# Patient Record
Sex: Female | Born: 1940 | Race: White | Hispanic: No | Smoking: Never smoker
Health system: Southern US, Community
[De-identification: ages and names within clinical notes are randomized; demographics above are authoritative.]

## PROBLEM LIST (undated history)

## (undated) DIAGNOSIS — J302 Other seasonal allergic rhinitis: Secondary | ICD-10-CM

## (undated) DIAGNOSIS — M1990 Unspecified osteoarthritis, unspecified site: Secondary | ICD-10-CM

## (undated) DIAGNOSIS — R079 Chest pain, unspecified: Secondary | ICD-10-CM

## (undated) DIAGNOSIS — I499 Cardiac arrhythmia, unspecified: Secondary | ICD-10-CM

## (undated) DIAGNOSIS — E119 Type 2 diabetes mellitus without complications: Secondary | ICD-10-CM

## (undated) DIAGNOSIS — Z9889 Other specified postprocedural states: Secondary | ICD-10-CM

## (undated) DIAGNOSIS — E669 Obesity, unspecified: Secondary | ICD-10-CM

## (undated) DIAGNOSIS — R112 Nausea with vomiting, unspecified: Secondary | ICD-10-CM

## (undated) DIAGNOSIS — E7800 Pure hypercholesterolemia, unspecified: Secondary | ICD-10-CM

## (undated) DIAGNOSIS — I10 Essential (primary) hypertension: Secondary | ICD-10-CM

## (undated) DIAGNOSIS — G4730 Sleep apnea, unspecified: Secondary | ICD-10-CM

## (undated) HISTORY — DX: Essential (primary) hypertension: I10

## (undated) HISTORY — PX: HAMMER TOE SURGERY: SHX385

## (undated) HISTORY — DX: Type 2 diabetes mellitus without complications: E11.9

## (undated) HISTORY — DX: Cardiac arrhythmia, unspecified: I49.9

## (undated) HISTORY — DX: Other seasonal allergic rhinitis: J30.2

## (undated) HISTORY — PX: APPENDECTOMY: SHX54

## (undated) HISTORY — PX: EXAMINATION UNDER ANESTHESIA: SHX1540

## (undated) HISTORY — DX: Pure hypercholesterolemia, unspecified: E78.00

## (undated) HISTORY — PX: CARPAL TUNNEL RELEASE: SHX101

## (undated) HISTORY — DX: Sleep apnea, unspecified: G47.30

## (undated) HISTORY — PX: BREAST CYST EXCISION: SHX579

## (undated) HISTORY — PX: CHOLECYSTECTOMY: SHX55

## (undated) HISTORY — DX: Obesity, unspecified: E66.9

---

## 1976-09-16 HISTORY — PX: VAGINAL HYSTERECTOMY: SUR661

## 1998-03-10 ENCOUNTER — Other Ambulatory Visit: Admission: RE | Admit: 1998-03-10 | Discharge: 1998-03-10 | Payer: Self-pay | Admitting: *Deleted

## 1999-01-13 ENCOUNTER — Inpatient Hospital Stay (HOSPITAL_COMMUNITY): Admission: EM | Admit: 1999-01-13 | Discharge: 1999-01-16 | Payer: Self-pay | Admitting: Cardiology

## 1999-01-23 ENCOUNTER — Other Ambulatory Visit: Admission: RE | Admit: 1999-01-23 | Discharge: 1999-01-23 | Payer: Self-pay | Admitting: *Deleted

## 1999-05-25 ENCOUNTER — Encounter: Payer: Self-pay | Admitting: *Deleted

## 1999-05-28 ENCOUNTER — Ambulatory Visit (HOSPITAL_COMMUNITY): Admission: RE | Admit: 1999-05-28 | Discharge: 1999-05-29 | Payer: Self-pay | Admitting: *Deleted

## 1999-05-28 ENCOUNTER — Encounter: Payer: Self-pay | Admitting: *Deleted

## 2000-02-29 ENCOUNTER — Other Ambulatory Visit: Admission: RE | Admit: 2000-02-29 | Discharge: 2000-02-29 | Payer: Self-pay | Admitting: *Deleted

## 2001-03-24 ENCOUNTER — Other Ambulatory Visit: Admission: RE | Admit: 2001-03-24 | Discharge: 2001-03-24 | Payer: Self-pay | Admitting: *Deleted

## 2003-01-07 ENCOUNTER — Ambulatory Visit (HOSPITAL_COMMUNITY): Admission: RE | Admit: 2003-01-07 | Discharge: 2003-01-07 | Payer: Self-pay | Admitting: Cardiology

## 2003-08-25 ENCOUNTER — Other Ambulatory Visit: Admission: RE | Admit: 2003-08-25 | Discharge: 2003-08-25 | Payer: Self-pay | Admitting: *Deleted

## 2004-03-02 ENCOUNTER — Ambulatory Visit (HOSPITAL_COMMUNITY): Admission: RE | Admit: 2004-03-02 | Discharge: 2004-03-02 | Payer: Self-pay | Admitting: Surgery

## 2008-08-19 ENCOUNTER — Encounter: Admission: RE | Admit: 2008-08-19 | Discharge: 2008-08-19 | Payer: Self-pay | Admitting: *Deleted

## 2009-01-18 ENCOUNTER — Emergency Department (HOSPITAL_COMMUNITY): Admission: EM | Admit: 2009-01-18 | Discharge: 2009-01-18 | Payer: Self-pay | Admitting: Emergency Medicine
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
## 2010-01-31 ENCOUNTER — Encounter: Admission: RE | Admit: 2010-01-31 | Discharge: 2010-01-31 | Payer: Self-pay | Admitting: Family Medicine

## 2010-02-10 ENCOUNTER — Encounter: Payer: Self-pay | Admitting: Cardiology

## 2010-04-02 ENCOUNTER — Encounter: Payer: Self-pay | Admitting: Cardiology

## 2010-04-23 DIAGNOSIS — E7800 Pure hypercholesterolemia, unspecified: Secondary | ICD-10-CM | POA: Insufficient documentation

## 2010-04-23 DIAGNOSIS — R5383 Other fatigue: Secondary | ICD-10-CM

## 2010-04-23 DIAGNOSIS — R0602 Shortness of breath: Secondary | ICD-10-CM

## 2010-04-23 DIAGNOSIS — R079 Chest pain, unspecified: Secondary | ICD-10-CM | POA: Insufficient documentation

## 2010-04-23 DIAGNOSIS — R5381 Other malaise: Secondary | ICD-10-CM

## 2010-04-23 DIAGNOSIS — E108 Type 1 diabetes mellitus with unspecified complications: Secondary | ICD-10-CM

## 2010-04-23 DIAGNOSIS — I10 Essential (primary) hypertension: Secondary | ICD-10-CM | POA: Insufficient documentation

## 2010-04-25 ENCOUNTER — Ambulatory Visit: Payer: Self-pay | Admitting: Cardiology

## 2010-04-25 DIAGNOSIS — R002 Palpitations: Secondary | ICD-10-CM

## 2010-05-25 ENCOUNTER — Ambulatory Visit (HOSPITAL_COMMUNITY): Admission: RE | Admit: 2010-05-25 | Discharge: 2010-05-25 | Payer: Self-pay | Admitting: Cardiology

## 2010-05-25 ENCOUNTER — Ambulatory Visit: Payer: Self-pay

## 2010-05-25 ENCOUNTER — Ambulatory Visit: Payer: Self-pay | Admitting: Cardiology

## 2010-05-25 ENCOUNTER — Encounter: Payer: Self-pay | Admitting: Cardiology

## 2010-06-22 ENCOUNTER — Ambulatory Visit: Payer: Self-pay | Admitting: Cardiology

## 2010-06-27 ENCOUNTER — Telehealth (INDEPENDENT_AMBULATORY_CARE_PROVIDER_SITE_OTHER): Payer: Self-pay | Admitting: *Deleted

## 2010-06-29 ENCOUNTER — Telehealth: Payer: Self-pay | Admitting: Cardiology

## 2010-10-16 NOTE — Letter (Signed)
Summary: [facility] Main Problem List/Health Maintenance Record  [facility] Problem List/Health Maintenance Record   Imported By: Roderic Ovens 05/01/2010 11:05:28  _____________________________________________________________________  External Attachment:    Type:   Image     Comment:   External Document

## 2010-10-16 NOTE — Procedures (Signed)
Summary: Summary Report  Summary Report   Imported By: Erle Crocker 07/03/2010 11:43:22  _____________________________________________________________________  External Attachment:    Type:   Image     Comment:   External Document

## 2010-10-16 NOTE — Assessment & Plan Note (Signed)
Summary: per check out/sf   CC:  follow up.  History of Present Illness: 70 year old female I saw in August of 2011 for chest pain and tachycardia. Cardiac catheterization in 2004 showed normal coronary arteries and normal LV function. Patient has had intermittent palpitations for years. Echocardiogram in August of 2011 revealed vigorous LV function and a mid-cavitary gradient as well as mild left atrial enlargement. Cardionet showed sinus rhythm. Stress echocardiogram showed no ischemia on echo images. There was a hypertensive response. Since I saw her previously she does have some dyspnea on exertion relieved with rest. There is no orthopnea, PND, pedal edema, hypertension, syncope. She occasionally feels a tightness in her chest which has been intermittent for years.  Current Medications (verified): 1)  Lantus Solostar 100 Unit/ml Soln (Insulin Glargine) .... 40 Untis Daily 2)  Losartan Potassium-Hctz 100-25 Mg Tabs (Losartan Potassium-Hctz) .[name].. 1 Tab By Mouth Once Daily 3)  Aspirin Ec 325 Mg Tbec (Aspirin) .... Take One Tablet By Mouth Daily 4)  Simcor 1000-20 Mg Xr24h-Tab (Niacin-Simvastatin) .[name].. 1 Tab By Mouth Once Daily 5)  Metformin Hcl 500 Mg Tabs (Metformin Hcl) .... 2 Tabs By Mouth Two Times A Day 6)  Fish Oil   Oil (Fish Oil) .[name].. 1 Tab By Mouth Once Daily 7)  Calcium Carbonate 600 Mg Tabs (Calcium Carbonate) .... 2 Tabs  By Mouth Once Daily 8)  Metoprolol Succinate 25 Mg Xr24h-Tab (Metoprolol Succinate) .... Take One Tablet By Mouth Daily  Past History:  Past Medical History: Reviewed history from 04/25/2010 and no changes required. HYPERTENSION  HYPERCHOLESTEROLEMIA  DM   Past Surgical History: Reviewed history from 04/25/2010 and no changes required. cholecystectomy hysterectomy  Examination under anesthesia and sphincterotomy. carpal tunnel Appendectomy  Social History: Reviewed history from 04/25/2010 and no changes required. Full Time Married  Tobacco Use -  No.  Alcohol Use - no  Review of Systems       no fevers or chills, productive cough, hemoptysis, dysphasia, odynophagia, melena, hematochezia, dysuria, hematuria, rash, seizure activity, orthopnea, PND, pedal edema, claudication. Remaining systems are negative.   Vital Signs:  Patient profile:   70 year old female Height:      67 inches Weight:      217 pounds BMI:     34.11 Pulse rate:   72 / minute Resp:     14 per minute BP sitting:   137 / 73  (left arm)  Vitals Entered By: Kem Parkinson (June 22, 2010 10:16 AM)  Physical Exam  General:  Well-developed well-nourished in no acute distress.  Skin is warm and dry.  HEENT is normal.  Neck is supple. No thyromegaly.  Chest is clear to auscultation with normal expansion.  Cardiovascular exam is regular rate and rhythm.  Abdominal exam nontender or distended. No masses palpated. Extremities show no edema. neuro grossly intact    Impression & Recommendations:  Problem # 1:  PALPITATIONS (ICD-785.1) Patient has had no palpitations while wearing her monitor. Hopefully we will be able to catch her rhythm disturbance in the future. She is having fatigue and I will discontinue her Toprol. Begin Cardizem CD 180 mg p.o. daily. Her updated medication list for this problem includes:    Aspirin Ec 325 Mg Tbec (Aspirin) .[name]... Take one tablet by mouth daily    Cardizem Cd 180 Mg Xr24h-cap (Diltiazem hcl coated beads) ..... One tablet by mouth once daily  Problem # 2:  SHORTNESS OF BREATH (ICD-786.05) I wonder whether there is a component of  diastolic dysfunction with her vigorous LV function. As above discontinue Toprol secondary to fatigue and treatment Cardizem CD 180 mg p.o. daily. Her updated medication list for this problem includes:    Losartan Potassium-hctz 100-25 Mg Tabs (Losartan potassium-hctz) .[name]... 1 tab by mouth once daily    Aspirin Ec 325 Mg Tbec (Aspirin) .[name]... Take one tablet by mouth daily    Metoprolol  Succinate 25 Mg Xr24h-tab (Metoprolol succinate) .[name]... Take one tablet by mouth daily  Problem # 3:  CHEST PAIN (ICD-786.50)  Stress echocardiogram shows no ischemia. Her updated medication list for this problem includes:    Aspirin Ec 325 Mg Tbec (Aspirin) .[name]... Take one tablet by mouth daily    Cardizem Cd 180 Mg Xr24h-cap (Diltiazem hcl coated beads) ..... One tablet by mouth once daily  Her updated medication list for this problem includes:    Aspirin Ec 325 Mg Tbec (Aspirin) .[name]... Take one tablet by mouth daily    Cardizem Cd 180 Mg Xr24h-cap (Diltiazem hcl coated beads) ..... One tablet by mouth once daily  Problem # 4:  HYPERTENSION (ICD-401.9)  Discontinue beta blocker and dig and Cardizem. Her updated medication list for this problem includes:    Losartan Potassium-hctz 100-25 Mg Tabs (Losartan potassium-hctz) .[name]... 1 tab by mouth once daily    Aspirin Ec 325 Mg Tbec (Aspirin) .[name]... Take one tablet by mouth daily    Cardizem Cd 180 Mg Xr24h-cap (Diltiazem hcl coated beads) ..... One tablet by mouth once daily  Her updated medication list for this problem includes:    Losartan Potassium-hctz 100-25 Mg Tabs (Losartan potassium-hctz) .[name]... 1 tab by mouth once daily    Aspirin Ec 325 Mg Tbec (Aspirin) .[name]... Take one tablet by mouth daily    Cardizem Cd 180 Mg Xr24h-cap (Diltiazem hcl coated beads) ..... One tablet by mouth once daily  Problem # 5:  HYPERCHOLESTEROLEMIA (ICD-272.0)  Continue statin. Lipids and liver monitored by primary care. Her updated medication list for this problem includes:    Simcor 1000-20 Mg Xr24h-tab (Niacin-simvastatin) .[name]... 1 tab by mouth once daily  Her updated medication list for this problem includes:    Simcor 1000-20 Mg Xr24h-tab (Niacin-simvastatin) .[name]... 1 tab by mouth once daily  Problem # 6:  DM (ICD-250.00)  Her updated medication list for this problem includes:    Lantus Solostar 100 Unit/ml Soln (Insulin glargine) .Marland KitchenMarland KitchenMarland KitchenMarland Kitchen 40 untis  daily    Losartan Potassium-hctz 100-25 Mg Tabs (Losartan potassium-hctz) .[name]... 1 tab by mouth once daily    Aspirin Ec 325 Mg Tbec (Aspirin) .[name]... Take one tablet by mouth daily    Metformin Hcl 500 Mg Tabs (Metformin hcl) .[name]... 2 tabs by mouth two times a day  Her updated medication list for this problem includes:    Lantus Solostar 100 Unit/ml Soln (Insulin glargine) .Marland KitchenMarland KitchenMarland KitchenMarland Kitchen 40 untis daily    Losartan Potassium-hctz 100-25 Mg Tabs (Losartan potassium-hctz) .[name]... 1 tab by mouth once daily    Aspirin Ec 325 Mg Tbec (Aspirin) .[name]... Take one tablet by mouth daily    Metformin Hcl 500 Mg Tabs (Metformin hcl) .[name]... 2 tabs by mouth two times a day  Patient Instructions: 1)  Your physician recommends that you schedule a follow-up appointment in: 6 MONTHS 2)  Your physician has recommended you make the following change in your medication: STOP METOPROLOL 3)  START CARDIZEM CD 180MG  ONCE DAILY Prescriptions: CARDIZEM CD 180 MG XR24H-CAP (DILTIAZEM HCL COATED BEADS) ONE TABLET by mouth once daily  #90 x  12   Entered by:   Deliah Goody, RN   Authorized by:   Ferman Hamming, MD, [facility]   Signed by:   Deliah Goody, RN on 06/22/2010   Method used:   Faxed to ...       [facility] [employer] education [facility])       [address] [address]       [place], [place]  [address]       Ph: [phone]       Fax: [phone]   RxID:   [id number]

## 2010-10-16 NOTE — Assessment & Plan Note (Signed)
Summary: np6/ dx: tackycardia, chestpain/ pt has bcbs/ gd   CC:  chest pain and sob.Adrienne KitchenMarland KitchenSeverity  of pain 7.  History of Present Illness: 70 year old female was evaluated for chest pain and tachycardia. Cardiac catheterization in 2004 showed normal coronary arteries and normal LV function. Patient has had intermittent palpitations for years. They are described as her heart "racing". They are sudden in onset and resolved after 3-5 minutes. There is associated short of breath and chest tightness as well as mild presyncope but no syncope. She had a more severe episode on July 2. She has also noticed increased dyspnea on exertion but no orthopnea, PND or pedal edema. She also notices occasional chest tightness with activities relieved with rest. There is no associated symptoms. Because of the above we were asked to further evaluate.  Preventive Screening-Counseling & Management  Alcohol-Tobacco     Smoking Status: never  Current Medications (verified): 1)  Lantus Solostar 100 Unit/ml Soln (Insulin Glargine) .... 40 Untis Daily 2)  Losartan Potassium-Hctz 100-25 Mg Tabs (Losartan Potassium-Hctz) .Adrienne Cook.. 1 Tab By Mouth Once Daily 3)  Aspirin Ec 325 Mg Tbec (Aspirin) .... Take One Tablet By Mouth Daily 4)  Simcor 1000-20 Mg Xr24h-Tab (Niacin-Simvastatin) .Adrienne Cook.. 1 Tab By Mouth Once Daily 5)  Metformin Hcl 500 Mg Tabs (Metformin Hcl) .... 2 Tabs By Mouth Two Times A Day 6)  Fish Oil   Oil (Fish Oil) .Adrienne Cook.. 1 Tab By Mouth Once Daily 7)  Calcium Carbonate 600 Mg Tabs (Calcium Carbonate) .... 2 Tabs  By Mouth Once Daily  Past History:  Past Medical History: HYPERTENSION  HYPERCHOLESTEROLEMIA  DM   Past Surgical History: cholecystectomy hysterectomy  Examination under anesthesia and sphincterotomy. carpal tunnel Appendectomy  Family History: Reviewed history and no changes required. Mother with CHF  Social History: Reviewed history and no changes required. Full Time Married  Tobacco Use - No.    Alcohol Use - no Smoking Status:  never  Review of Systems       no fevers or chills, productive cough, hemoptysis, dysphasia, odynophagia, melena, hematochezia, dysuria, hematuria, rash, seizure activity, orthopnea, PND, pedal edema, claudication. Remaining systems are negative.   Vital Signs:  Patient profile:   70 year old female Height:      67 inches Weight:      214 pounds BMI:     33.64 Pulse rate:   100 / minute Resp:     14 per minute BP sitting:   154 / 83  (left arm)  Vitals Entered By: Kem Parkinson (April 25, 2010 10:15 AM)  Physical Exam  General:  Well developed/well nourished in NAD Skin warm/dry Patient not depressed No peripheral clubbing Back-normal HEENT-normal/normal eyelids Neck supple/normal carotid upstroke bilaterally; no bruits; no JVD; no thyromegaly chest - CTA/ normal expansion CV - RRR/normal S1 and S2; no murmurs, rubs or gallops;  PMI nondisplaced; 1/6 systolic murmur left sternal border. Abdomen -NT/ND, no HSM, no mass, + bowel sounds, no bruit 2+ femoral pulses, no bruits Ext-no edema, chords, 2+ DP Neuro-grossly nonfocal     EKG  Procedure date:  04/25/2010  Findings:      Normal sinus rhythm at a rate of 95. Axis normal. RV conduction delay. No ST changes.  Impression & Recommendations:  Problem # 1:  CHEST PAIN (ICD-786.50)  Symptoms somewhat atypical. Scheduled stress echocardiogram. Her updated medication list for this problem includes:    Aspirin Ec 325 Mg Tbec (Aspirin) .Adrienne Cook... Take one tablet by mouth daily  Orders: Stress Echo (  Stress Echo)  Problem # 2:  SHORTNESS OF BREATH (ICD-786.05) Schedule echocardiogram to evaluate LV function and for palpitations as well. Her updated medication list for this problem includes:    Losartan Potassium-hctz 100-25 Mg Tabs (Losartan potassium-hctz) .Adrienne Cook... 1 tab by mouth once daily    Aspirin Ec 325 Mg Tbec (Aspirin) .Adrienne Cook... Take one tablet by mouth daily  Problem # 3:   HYPERTENSION (ICD-401.9) Blood pressure is mildly elevated. We will follow this and most likely add a beta blocker in the future as needed. Her updated medication list for this problem includes:    Losartan Potassium-hctz 100-25 Mg Tabs (Losartan potassium-hctz) .Adrienne Cook... 1 tab by mouth once daily    Aspirin Ec 325 Mg Tbec (Aspirin) .Adrienne Cook... Take one tablet by mouth daily  Problem # 4:  DM (ICD-250.00)  Her updated medication list for this problem includes:    Lantus Solostar 100 Unit/ml Soln (Insulin glargine) .Adrienne KitchenMarland KitchenMarland KitchenMarland Cook 40 untis daily    Losartan Potassium-hctz 100-25 Mg Tabs (Losartan potassium-hctz) .Adrienne Cook... 1 tab by mouth once daily    Aspirin Ec 325 Mg Tbec (Aspirin) .Adrienne Cook... Take one tablet by mouth daily    Metformin Hcl 500 Mg Tabs (Metformin hcl) .Adrienne Cook... 2 tabs by mouth two times a day  Problem # 5:  PALPITATIONS (ICD-785.1)  Etiology unclear but sounds possibly to have an SVT. Schedule cardionet. Will consider beta blocker in the future. Her updated medication list for this problem includes:    Aspirin Ec 325 Mg Tbec (Aspirin) .Adrienne Cook... Take one tablet by mouth daily  Orders: Event (Event) Echocardiogram (Echo)  Patient Instructions: 1)  Your physician recommends that you schedule a follow-up appointment in: 6-8-WEEKS 2)  Your physician has requested that you have a stress echocardiogram. For further information please visit [email or web address]  Please follow instruction sheet as given. 3)  Your physician has requested that you have an echocardiogram.  Echocardiography is a painless test that uses sound waves to create images of your heart. It provides your doctor with information about the size and shape of your heart and how well your heart's chambers and valves are working.  This procedure takes approximately one hour. There are no restrictions for this procedure. 4)  Your physician has recommended that you wear an event monitor.  Event monitors are medical devices that record the heart's  electrical activity. Doctors most often use these monitors to diagnose arrhythmias. Arrhythmias are problems with the speed or rhythm of the heartbeat. The monitor is a small, portable device. You can wear one while you do your normal daily activities. This is usually used to diagnose what is causing palpitations/syncope (passing out).

## 2010-10-16 NOTE — Progress Notes (Signed)
  Phone Note Outgoing Call   Call placed by: Scherrie Bateman, LPN,  June 27, 2010 3:44 PM Call placed to: Patient Summary of Call: LEFT MESSAGE FOR PT TO CALL BACK RE  MONITOR RESULTS SINUS WITH PVC  PT MAY HAVE BEEN INFORMED AT 10/7/ OV. Initial call taken by: Scherrie Bateman, LPN,  June 27, 2010 3:45 PM  Follow-up for Phone Call        Pt returning call , [phone] Judie Grieve  June 28, 2010 9:45 AM LMTCB Scherrie Bateman, LPN  June 28, 2010 4:03 PM  PT AWARE OF MONITOR RESULTS. Follow-up by: Scherrie Bateman, LPN,  June 28, 2010 4:05 PM

## 2010-10-16 NOTE — Procedures (Signed)
Summary: summary report  summary report   Imported By: Erle Crocker 07/03/2010 14:25:29  _____________________________________________________________________  External Attachment:    Type:   Image     Comment:   External Document

## 2010-10-16 NOTE — Progress Notes (Signed)
Summary: refill  Phone Note Refill Request Message from:  Patient on June 29, 2010 11:00 AM  Refills Requested: Medication #1:  CARDIZEM CD 180 MG XR24H-CAP ONE TABLET by mouth once daily. [facility] [phone]   Initial call taken by: Judie Grieve,  June 29, 2010 11:00 AM    Prescriptions: CARDIZEM CD 180 MG XR24H-CAP (DILTIAZEM HCL COATED BEADS) ONE TABLET by mouth once daily  #90 x 3   Entered by:   Kem Parkinson   Authorized by:   Ferman Hamming, MD, [facility]   Signed by:   Kem Parkinson on 06/29/2010   Method used:   Electronically to        [facility] (mail-order)             , [place]         Ph: [phone]       Fax: [phone]   RxID:   [id number]

## 2010-10-16 NOTE — Letter (Signed)
Summary: [facility] Health Record   [facility] Health Record   Imported By: Roderic Ovens 05/01/2010 11:06:22  _____________________________________________________________________  External Attachment:    Type:   Image     Comment:   External Document

## 2011-02-01 NOTE — Cardiovascular Report (Signed)
NAME:  Adrienne Cook, Adrienne Cook                         ACCOUNT NO.:  [id number]   MEDICAL RECORD NO.:  [id number]                   PATIENT TYPE:  OIB   LOCATION:  2899                                 FACILITY:  [facility]   PHYSICIAN:  Charlies Constable, M.D.                  DATE OF BIRTH:  16-Aug-1941   DATE OF PROCEDURE:  01/07/2003  DATE OF DISCHARGE:                              CARDIAC CATHETERIZATION   CLINICAL HISTORY:  The patient is 70 years old and recently saw Dr. Rollene Rotunda in consultation for exertional chest pain which has been present  over the past 4 weeks.  She has a history of a previous catheterization from  several years ago which did not show any significant obstructive coronary  disease.  Dr. Antoine Poche felt her symptoms were very suggestive of angina and  arranged for her to come in for evaluation with angiography.   Her other medical problems include newly diagnosed hypertension, diabetes,  and hyperlipidemia.   PROCEDURE:  The procedure was performed via the right femoral artery using  an arterial sheath and 6-French preformed coronary catheters.  A front wall  arterial puncture was performed and Omnipaque contrast was used.  The  patient was enrolled in the Matrix trial and randomized to closure with a  Matrix device.  The right femoral artery was closed with this device at the  end of the procedure.  The patient tolerated well and left the laboratory in  satisfactory condition.   RESULTS:  1. The left main coronary artery:  The left main coronary artery was free of     significant disease.  2. Left anterior descending:  The left anterior descending artery gave rise     to 2 septal perforators and a diagonal branch.  These and the LAD     __________ were free of significant disease.  3. Circumflex artery:  The circumflex artery gave rise to an intermediate     branch, an atrial branch, and 2 posterolateral branches.  These vessels     were free of significant  disease.  4. Right coronary artery:  The right coronary is a moderately large vessel.     It gave rise to a right ventricular branch, a posterior descending     branch, and 2 posterolateral branches.  This vessel was free of     significant disease.   LEFT VENTRICULOGRAM:  The left ventriculogram was performed in the RAO  projection and showed good wall motion with no areas of hypokinesis.  The  left ventricle appeared somewhat hyperdynamic with an estimated ejection  fraction was 70%.  There appeared to be left ventricular hypertrophy.   The aortic pressure was 104/60 with a mean of 77.  Left ventricular pressure  was 104/8.   CONCLUSIONS:  1. Normal coronary angiography.  2. Normal left ventricular systolic function with suggestion of left     ventricular  hypertrophy and hyperdynamic ventricle.    RECOMMENDATIONS:  The etiology of the patient's symptoms are not clear.  She  has LVH and she had slight slow flow down the LAD, so she could have  microvascular dysfunction related to this, responsible for her symptoms.  Will plan to let her go home as an outpatient and see Dr. Antoine Poche back in  followup, and will do an echocardiogram to evaluate her left ventricle for  LVH prior to that visit.                                               Charlies Constable, M.D.    BB/MEDQ  D:  01/07/2003  T:  01/10/2003  Job:  [id number]   cc:   Colon Flattery, MD  [address]  [place]  [place] [address]  Fax: [phone]   Cardiopulmonary Lab

## 2011-02-01 NOTE — Op Note (Signed)
NAME:  Adrienne Cook, Adrienne Cook                         ACCOUNT NO.:  [id number]   MEDICAL RECORD NO.:  [id number]                   PATIENT TYPE:  AMB   LOCATION:  DAY                                  FACILITY[facility]  [facility]   PHYSICIAN:  Abigail Miyamoto, M.D.              DATE OF BIRTH:  1941-04-03   DATE OF PROCEDURE:  03/02/2004  DATE OF DISCHARGE:                                 OPERATIVE REPORT   PREOPERATIVE DIAGNOSES:  Chronic anal fissure.   POSTOPERATIVE DIAGNOSES:  Chronic anal fissure.   PROCEDURE:  Examination under anesthesia and sphincterotomy.   SURGEON:  Abigail Miyamoto, M.D.   ANESTHESIA:  General endotracheal anesthesia and 0.25% Marcaine with  epinephrine.   ESTIMATED BLOOD LOSS:  Minimal.   FINDINGS:  The patient was found to have an anterior anal fissure, she also  had multiple enlarged external hemorrhoids circumferentially, the rest of  the anal canal was normal.   DESCRIPTION OF PROCEDURE:  The patient was brought to the operating room,  identified as Adrienne Cook.  She was placed supine on the operating room  table and anesthesia was induced. The patient was then placed in the prone  position. Her buttocks were then taped apart.  Her perianal area was then  prepped and draped in the usual sterile fashion.  The patient was found to  have multiple large hemorrhoids circumferentially and one large skin tag.  She was also found to have a large anterior fissure. A Hill-Ferguson  retractor was inserted into the anal canal and the rest of the anal canal  appeared normal except for some small internal hemorrhoids.  At this point,  the anterior anal fissure began to bleed somewhat and was cauterized. Next,  a left lateral incision was created and the sphincter muscles were  identified and a partial sphincterotomy was performed with the  electrocautery splitting the sphincter muscles.  The incision was then  closed with interrupted 3-0 chromic sutures. A separate large skin  tag was  then excised with the electrocautery.  The rest of the hemorrhoids were  massaged to help rid them of their edema.  The perianal area was then  anesthetized circumferentially with 0.25% Marcaine with epinephrine.  Hemostasis was felt to be achieved. The patient was then placed back into  the supine position and was extubated in the operating room and taken in  stable condition to the recovery room.                                               Abigail Miyamoto, M.D.    DB/MEDQ  D:  03/02/2004  T:  03/02/2004  Job:  [id number]

## 2011-04-08 ENCOUNTER — Encounter: Payer: Self-pay | Admitting: Cardiology

## 2011-04-11 ENCOUNTER — Ambulatory Visit (INDEPENDENT_AMBULATORY_CARE_PROVIDER_SITE_OTHER): Payer: Medicare Other | Admitting: Cardiology

## 2011-04-11 ENCOUNTER — Encounter: Payer: Self-pay | Admitting: Cardiology

## 2011-04-11 DIAGNOSIS — E7800 Pure hypercholesterolemia, unspecified: Secondary | ICD-10-CM

## 2011-04-11 DIAGNOSIS — R002 Palpitations: Secondary | ICD-10-CM

## 2011-04-11 DIAGNOSIS — I10 Essential (primary) hypertension: Secondary | ICD-10-CM

## 2011-04-11 DIAGNOSIS — R0602 Shortness of breath: Secondary | ICD-10-CM

## 2011-04-11 NOTE — Progress Notes (Signed)
HPI: Pleasant female I saw in August of 2011 for chest pain and tachycardia. Cardiac catheterization in 2004 showed normal coronary arteries and normal LV function. Patient has had intermittent palpitations for years. Echocardiogram in August of 2011 revealed vigorous LV function and a mid-cavitary gradient as well as mild left atrial enlargement. Cardionet showed sinus rhythm. Stress echocardiogram showed no ischemia on echo images. There was a hypertensive response. Since I last saw her in Oct of 2011, the patient has dyspnea with more extreme activities but not with routine activities. It is relieved with rest. It is not associated with chest pain. There is no orthopnea, PND or pedal edema. There is no syncope. There is no exertional chest pain. Occasional brief palpitations but not sustained.   Current Outpatient Prescriptions  Medication Sig Dispense Refill  . aspirin 325 MG EC tablet Take 325 mg by mouth daily.        . calcium carbonate (OS-CAL) 600 MG TABS Take 1,200 mg by mouth daily.        [name] diltiazem (CARDIZEM CD) 180 MG 24 hr capsule Take 180 mg by mouth daily.        . fish oil-omega-3 fatty acids 1000 MG capsule Take 2 g by mouth daily.        . insulin detemir (LEVEMIR) 100 UNIT/ML injection Inject 80 Units into the skin daily.        [name] losartan-hydrochlorothiazide (HYZAAR) 100-25 MG per tablet Take 1 tablet by mouth daily.        . metFORMIN (GLUMETZA) 1000 MG (MOD) 24 hr tablet Take 1,000 mg by mouth 2 (two) times daily with a meal.        . niacin (NIASPAN) 1000 MG CR tablet 2 tabs po qhs       . simvastatin (ZOCOR) 20 MG tablet Take 20 mg by mouth at bedtime.           Past Medical History  Diagnosis Date  . HTN (hypertension)   . Hypercholesterolemia   . DM (diabetes mellitus)     Past Surgical History  Procedure Date  . Cholecystectomy   . Hysterectomy, unspec. area   . Examination under anesthesia     and sphincterotomy  . Carpal tunnel release   . Appendectomy       History   Social History  . Marital Status: Married    Spouse Name: N/A    Number of Children: N/A  . Years of Education: N/A   Occupational History  . Not on file.   Social History Main Topics  . Smoking status: Never Smoker   . Smokeless tobacco: Not on file   Comment: tobacco use - no  . Alcohol Use: No  . Drug Use: Not on file  . Sexually Active: Not on file   Other Topics Concern  . Not on file   Social History Narrative   Married; full time.     ROS: no fevers or chills, productive cough, hemoptysis, dysphasia, odynophagia, melena, hematochezia, dysuria, hematuria, rash, seizure activity, orthopnea, PND, pedal edema, claudication. Remaining systems are negative.  Physical Exam: Well-developed well-nourished in no acute distress.  Skin is warm and dry.  HEENT is normal.  Neck is supple. No thyromegaly.  Chest is clear to auscultation with normal expansion.  Cardiovascular exam is regular rate and rhythm.  Abdominal exam nontender or distended. No masses palpated. Extremities show no edema. neuro grossly intact  ECG normal sinus rhythm at a rate of 83. Left ventricular  hypertrophy.

## 2011-04-11 NOTE — Assessment & Plan Note (Signed)
Continue statin. Lipids and liver monitored by primary care. 

## 2011-04-11 NOTE — Patient Instructions (Signed)
Your physician wants you to follow-up in: 1 year. You will receive a reminder letter in the mail two months in advance. If you don't receive a letter, please call our office to schedule the follow-up appointment.  

## 2011-04-11 NOTE — Assessment & Plan Note (Signed)
Blood pressure controlled. Continue present medications. Potassium and renal function monitored by primary care. 

## 2011-04-11 NOTE — Assessment & Plan Note (Signed)
Patient continues with some dyspnea on exertion. Possible contribution from deconditioning and obesity hypoventilation syndrome. Previous cardiac workup negative. Continued present therapy.

## 2011-04-11 NOTE — Assessment & Plan Note (Signed)
Patient has an occasional flutter but no sustained palpitations. Continue Cardizem.

## 2012-01-14 ENCOUNTER — Encounter: Payer: Self-pay | Admitting: Internal Medicine

## 2012-01-31 ENCOUNTER — Ambulatory Visit (AMBULATORY_SURGERY_CENTER): Payer: Medicare Other | Admitting: *Deleted

## 2012-01-31 ENCOUNTER — Encounter: Payer: Self-pay | Admitting: Internal Medicine

## 2012-01-31 VITALS — Ht 68.0 in | Wt 214.3 lb

## 2012-01-31 DIAGNOSIS — Z1211 Encounter for screening for malignant neoplasm of colon: Secondary | ICD-10-CM

## 2012-01-31 MED ORDER — NA SULFATE-K SULFATE-MG SULF 17.5-3.13-1.6 GM/177ML PO SOLN: ORAL | Status: DC

## 2012-02-13 ENCOUNTER — Encounter: Payer: Self-pay | Admitting: Internal Medicine

## 2012-02-13 ENCOUNTER — Ambulatory Visit (AMBULATORY_SURGERY_CENTER): Payer: Medicare Other | Admitting: Internal Medicine

## 2012-02-13 VITALS — BP 147/87 | HR 90 | Temp 98.4°F | Resp 18 | Ht 68.0 in | Wt 214.0 lb

## 2012-02-13 DIAGNOSIS — Z1211 Encounter for screening for malignant neoplasm of colon: Secondary | ICD-10-CM

## 2012-02-13 HISTORY — PX: COLONOSCOPY: SHX174

## 2012-02-13 LAB — GLUCOSE, CAPILLARY: Glucose-Capillary: 156 mg/dL — ABNORMAL HIGH (ref 70–99)

## 2012-02-13 MED ORDER — SODIUM CHLORIDE 0.9 % IV SOLN: 500.0000 mL | INTRAVENOUS | Status: DC

## 2012-02-13 NOTE — Op Note (Signed)
[facility] [address] [place], [place]  [address]  COLONOSCOPY PROCEDURE REPORT  PATIENT:  Adrienne Cook, Adrienne Cook  MR#:  [id number] BIRTHDATE:  04-06-1941, 71 yrs. old  GENDER:  female ENDOSCOPIST:  Carie Caddy. Diogenes Whirley, MD REF. BY:  Prudy Feeler, PA-C PROCEDURE DATE:  02/13/2012 PROCEDURE:  Colonoscopy 53664 ASA CLASS:  Class II INDICATIONS:  Routine Risk Screening, last colonoscopy greater than 10 years ago MEDICATIONS:   MAC sedation, administered by CRNA, propofol (Diprivan) 350 mg IV  DESCRIPTION OF PROCEDURE:   After the risks benefits and alternatives of the procedure were thoroughly explained, informed consent was obtained.  Digital rectal exam was performed and revealed external hemorrhoids.   The LB [id number] endoscope was introduced through the anus and advanced to the cecum, which was identified by both the appendix and ileocecal valve, without limitations.  The quality of the prep was Suprep good.  The instrument was then slowly withdrawn as the colon was fully examined. <<PROCEDUREIMAGES>>  FINDINGS:  Moderate diverticulosis was found in the ascending and left colon.  This was otherwise a normal examination of the colon. Retroflexed views in the rectum revealed no abnormalities. The scope was then withdrawn from the cecum and the procedure completed.  COMPLICATIONS:  None  ENDOSCOPIC IMPRESSION: 1) Moderate diverticulosis in the ascending and left colon 2) Otherwise normal examination  RECOMMENDATIONS: 1) High fiber diet 2) Continue current medications 3) You should continue to follow colorectal cancer screening guidelines for "routine risk" patients with a repeat colonoscopy in 10 years. There is no need for FOBT (stool) testing for at least 5 years.  Carie Caddy. Trishna Cwik, MD  CC:  The Patient Prudy Feeler, PA-C  n. eSIGNED:   Carie Caddy. Imre Vecchione at 02/13/2012 09:08 AM  Abbott Pao, [phone]

## 2012-02-13 NOTE — Progress Notes (Signed)
Patient did not experience any of the following events: a burn prior to discharge; a fall within the facility; wrong site/side/patient/procedure/implant event; or a hospital transfer or hospital admission upon discharge from the facility. (G8907) Patient did not have preoperative order for IV antibiotic SSI prophylaxis. (G8918)  

## 2012-02-13 NOTE — Patient Instructions (Signed)
Discharge instructions given with verbal understanding. Handouts on diverticulosis and a high fiber diet given. Resume previous medications.YOU HAD AN ENDOSCOPIC PROCEDURE TODAY AT THE [facility]: Refer to the procedure report that was given to you for any specific questions about what was found during the examination.  If the procedure report does not answer your questions, please call your gastroenterologist to clarify.  If you requested that your care partner not be given the details of your procedure findings, then the procedure report has been included in a sealed envelope for you to review at your convenience later.  YOU SHOULD EXPECT: Some feelings of bloating in the abdomen. Passage of more gas than usual.  Walking can help get rid of the air that was put into your GI tract during the procedure and reduce the bloating. If you had a lower endoscopy (such as a colonoscopy or flexible sigmoidoscopy) you may notice spotting of blood in your stool or on the toilet paper. If you underwent a bowel prep for your procedure, then you may not have a normal bowel movement for a few days.  DIET: Your first meal following the procedure should be a light meal and then it is ok to progress to your normal diet.  A half-sandwich or bowl of soup is an example of a good first meal.  Heavy or fried foods are harder to digest and may make you feel nauseous or bloated.  Likewise meals heavy in dairy and vegetables can cause extra gas to form and this can also increase the bloating.  Drink plenty of fluids but you should avoid alcoholic beverages for 24 hours.  ACTIVITY: Your care partner should take you home directly after the procedure.  You should plan to take it easy, moving slowly for the rest of the day.  You can resume normal activity the day after the procedure however you should NOT DRIVE or use heavy machinery for 24 hours (because of the sedation medicines used during the test).    SYMPTOMS TO  REPORT IMMEDIATELY: A gastroenterologist can be reached at any hour.  During normal business hours, 8:30 AM to 5:00 PM Monday through Friday, call [phone].  After hours and on weekends, please call the GI answering service at [phone] who will take a message and have the physician on call contact you.   Following lower endoscopy (colonoscopy or flexible sigmoidoscopy):  Excessive amounts of blood in the stool  Significant tenderness or worsening of abdominal pains  Swelling of the abdomen that is new, acute  Fever of 100F or higher  FOLLOW UP: If any biopsies were taken you will be contacted by phone or by letter within the next 1-3 weeks.  Call your gastroenterologist if you have not heard about the biopsies in 3 weeks.  Our staff will call the home number listed on your records the next business day following your procedure to check on you and address any questions or concerns that you may have at that time regarding the information given to you following your procedure. This is a courtesy call and so if there is no answer at the home number and we have not heard from you through the emergency physician on call, we will assume that you have returned to your regular daily activities without incident.  SIGNATURES/CONFIDENTIALITY: You and/or your care partner have signed paperwork which will be entered into your electronic medical record.  These signatures attest to the fact that that the information above on your   After Visit Summary has been reviewed and is understood.  Full responsibility of the confidentiality of this discharge information lies with you and/or your care-partner.   

## 2012-02-13 NOTE — Progress Notes (Signed)
The pt tolerated the colonoscopy very well. Maw   

## 2012-02-14 ENCOUNTER — Telehealth: Payer: Self-pay | Admitting: *Deleted

## 2012-02-14 NOTE — Telephone Encounter (Signed)
  Follow up Call-  Call back number 02/13/2012  Post procedure Call Back phone  # [phone]  Permission to leave phone message Yes     Patient questions:  Do you have a fever, pain , or abdominal swelling? no Pain Score  0 *  Have you tolerated food without any problems? yes  Have you been able to return to your normal activities? yes  Do you have any questions about your discharge instructions: Diet   no Medications  no Follow up visit  no  Do you have questions or concerns about your Care? no  Actions: * If pain score is 4 or above: No action needed, pain <4.

## 2012-02-26 ENCOUNTER — Encounter: Payer: Self-pay | Admitting: Nurse Practitioner

## 2012-02-26 ENCOUNTER — Telehealth: Payer: Self-pay | Admitting: Cardiology

## 2012-02-26 NOTE — Telephone Encounter (Signed)
Pt is at [facility] seeing Prudy Feeler PA for chest pain and Bjorn Loser the assist called to get a sooner appt for pt and I asked her to please fax over todays not

## 2012-02-27 NOTE — Telephone Encounter (Signed)
Spoke with pt, she has not had any other chest pain but she is SOB with any exertion. She is willing to see the NP in dr crenshaw's absence. She will see lori gerhardt np tomorrow.

## 2012-02-28 ENCOUNTER — Ambulatory Visit (INDEPENDENT_AMBULATORY_CARE_PROVIDER_SITE_OTHER): Payer: Medicare Other | Admitting: Nurse Practitioner

## 2012-02-28 ENCOUNTER — Encounter: Payer: Self-pay | Admitting: Nurse Practitioner

## 2012-02-28 ENCOUNTER — Inpatient Hospital Stay (HOSPITAL_COMMUNITY): Payer: Medicare Other

## 2012-02-28 ENCOUNTER — Inpatient Hospital Stay (HOSPITAL_COMMUNITY)
Admission: AD | Admit: 2012-02-28 | Discharge: 2012-02-28 | DRG: 287 | Disposition: A | Discharge status: Home / Self Care | Payer: Medicare Other | Source: Ambulatory Visit | Attending: Cardiology | Admitting: Cardiology

## 2012-02-28 ENCOUNTER — Encounter (HOSPITAL_COMMUNITY): Payer: Self-pay | Admitting: General Practice

## 2012-02-28 ENCOUNTER — Encounter (HOSPITAL_COMMUNITY)
Admission: AD | Disposition: A | Discharge status: Home / Self Care | Payer: Self-pay | Source: Ambulatory Visit | Attending: Cardiology

## 2012-02-28 VITALS — BP 134/62 | HR 91 | Ht 68.0 in | Wt 204.6 lb

## 2012-02-28 DIAGNOSIS — R079 Chest pain, unspecified: Secondary | ICD-10-CM

## 2012-02-28 DIAGNOSIS — Z7982 Long term (current) use of aspirin: Secondary | ICD-10-CM

## 2012-02-28 DIAGNOSIS — R0789 Other chest pain: Secondary | ICD-10-CM

## 2012-02-28 DIAGNOSIS — I10 Essential (primary) hypertension: Secondary | ICD-10-CM | POA: Diagnosis present

## 2012-02-28 DIAGNOSIS — E7800 Pure hypercholesterolemia, unspecified: Secondary | ICD-10-CM | POA: Diagnosis present

## 2012-02-28 DIAGNOSIS — R072 Precordial pain: Principal | ICD-10-CM | POA: Diagnosis present

## 2012-02-28 DIAGNOSIS — E119 Type 2 diabetes mellitus without complications: Secondary | ICD-10-CM | POA: Diagnosis present

## 2012-02-28 DIAGNOSIS — E669 Obesity, unspecified: Secondary | ICD-10-CM | POA: Diagnosis present

## 2012-02-28 DIAGNOSIS — G4730 Sleep apnea, unspecified: Secondary | ICD-10-CM | POA: Diagnosis present

## 2012-02-28 DIAGNOSIS — Z794 Long term (current) use of insulin: Secondary | ICD-10-CM

## 2012-02-28 HISTORY — DX: Nausea with vomiting, unspecified: R11.2

## 2012-02-28 HISTORY — PX: LEFT HEART CATH: SHX5478

## 2012-02-28 HISTORY — DX: Unspecified osteoarthritis, unspecified site: M19.90

## 2012-02-28 HISTORY — DX: Other specified postprocedural states: Z98.890

## 2012-02-28 HISTORY — PX: LEFT HEART CATHETERIZATION WITH CORONARY ANGIOGRAM: SHX5451

## 2012-02-28 HISTORY — DX: Chest pain, unspecified: R07.9

## 2012-02-28 LAB — COMPREHENSIVE METABOLIC PANEL
ALT: 27 U/L (ref 0–35)
AST: 27 U/L (ref 0–37)
Albumin: 3.8 g/dL (ref 3.5–5.2)
Alkaline Phosphatase: 72 U/L (ref 39–117)
BUN: 39 mg/dL — ABNORMAL HIGH (ref 6–23)
CO2: 22 mEq/L (ref 19–32)
Calcium: 10.1 mg/dL (ref 8.4–10.5)
Chloride: 101 mEq/L (ref 96–112)
Creatinine, Ser: 1.17 mg/dL — ABNORMAL HIGH (ref 0.50–1.10)
GFR calc Af Amer: 54 mL/min — ABNORMAL LOW (ref >90)
GFR calc non Af Amer: 46 mL/min — ABNORMAL LOW (ref >90)
Glucose, Bld: 127 mg/dL — ABNORMAL HIGH (ref 70–99)
Potassium: 4.7 mEq/L (ref 3.5–5.1)
Sodium: 137 mEq/L (ref 135–145)
Total Bilirubin: 0.2 mg/dL — ABNORMAL LOW (ref 0.3–1.2)
Total Protein: 7.5 g/dL (ref 6.0–8.3)

## 2012-02-28 LAB — CARDIAC PANEL(CRET KIN+CKTOT+MB+TROPI)
CK, MB: 2.7 ng/mL (ref 0.3–4.0)
Relative Index: 2.7 — ABNORMAL HIGH (ref 0.0–2.5)
Total CK: 100 U/L (ref 7–177)
Troponin I: <0.3 ng/mL (ref <0.30)

## 2012-02-28 LAB — DIFFERENTIAL
Basophils Absolute: 0.1 10*3/uL (ref 0.0–0.1)
Basophils Relative: 1 % (ref 0–1)
Eosinophils Absolute: 0 10*3/uL (ref 0.0–0.7)
Eosinophils Relative: 0 % (ref 0–5)
Lymphocytes Relative: 17 % (ref 12–46)
Lymphs Abs: 1.8 10*3/uL (ref 0.7–4.0)
Monocytes Absolute: 0.6 10*3/uL (ref 0.1–1.0)
Monocytes Relative: 6 % (ref 3–12)
Neutro Abs: 8.4 10*3/uL — ABNORMAL HIGH (ref 1.7–7.7)
Neutrophils Relative %: 77 % (ref 43–77)

## 2012-02-28 LAB — HEMOGLOBIN A1C
Hgb A1c MFr Bld: 7.5 % — ABNORMAL HIGH (ref <5.7)
Mean Plasma Glucose: 169 mg/dL — ABNORMAL HIGH (ref <117)

## 2012-02-28 LAB — CBC
HCT: 38.8 % (ref 36.0–46.0)
Hemoglobin: 13.1 g/dL (ref 12.0–15.0)
MCH: 28.5 pg (ref 26.0–34.0)
MCHC: 33.8 g/dL (ref 30.0–36.0)
MCV: 84.5 fL (ref 78.0–100.0)
Platelets: 386 10*3/uL (ref 150–400)
RBC: 4.59 MIL/uL (ref 3.87–5.11)
RDW: 13.6 % (ref 11.5–15.5)
WBC: 10.9 10*3/uL — ABNORMAL HIGH (ref 4.0–10.5)

## 2012-02-28 LAB — PROTIME-INR
INR: 1.1 (ref 0.00–1.49)
Prothrombin Time: 14.4 seconds (ref 11.6–15.2)

## 2012-02-28 LAB — APTT: aPTT: 34 seconds (ref 24–37)

## 2012-02-28 LAB — TSH: TSH: 0.996 u[IU]/mL (ref 0.350–4.500)

## 2012-02-28 LAB — MAGNESIUM: Magnesium: 1.7 mg/dL (ref 1.5–2.5)

## 2012-02-28 SURGERY — LEFT HEART CATHETERIZATION WITH CORONARY ANGIOGRAM
Anesthesia: LOCAL

## 2012-02-28 MED ORDER — LIDOCAINE HCL (PF) 1 % IJ SOLN
INTRAMUSCULAR | Status: AC
  Filled 2012-02-28: qty 30

## 2012-02-28 MED ORDER — ASPIRIN 81 MG PO CHEW: 324.0000 mg | CHEWABLE_TABLET | ORAL | Status: DC

## 2012-02-28 MED ORDER — HEPARIN (PORCINE) IN NACL 100-0.45 UNIT/ML-% IJ SOLN
1100.0000 [IU]/h | INTRAMUSCULAR | Status: DC
  Administered 2012-02-28: 1100 [IU]/h via INTRAVENOUS
  Filled 2012-02-28: qty 250

## 2012-02-28 MED ORDER — DILTIAZEM HCL ER COATED BEADS 240 MG PO CP24
240.0000 mg | ORAL_CAPSULE | Freq: Every day | ORAL | Status: DC
  Filled 2012-02-28: qty 1

## 2012-02-28 MED ORDER — FENOFIBRATE 160 MG PO TABS
160.0000 mg | ORAL_TABLET | Freq: Every day | ORAL | Status: DC
  Filled 2012-02-28: qty 1

## 2012-02-28 MED ORDER — SODIUM CHLORIDE 0.9 % IV SOLN: INTRAVENOUS | Status: AC

## 2012-02-28 MED ORDER — FENTANYL CITRATE 0.05 MG/ML IJ SOLN
INTRAMUSCULAR | Status: AC
  Filled 2012-02-28: qty 2

## 2012-02-28 MED ORDER — ATORVASTATIN CALCIUM 10 MG PO TABS
10.0000 mg | ORAL_TABLET | Freq: Every day | ORAL | Status: DC
  Filled 2012-02-28: qty 1

## 2012-02-28 MED ORDER — ONDANSETRON HCL 4 MG/2ML IJ SOLN: 4.0000 mg | Freq: Four times a day (QID) | INTRAMUSCULAR | Status: DC | PRN

## 2012-02-28 MED ORDER — DEXTROSE-NACL 5-0.45 % IV SOLN
INTRAVENOUS | Status: DC
  Administered 2012-02-28: 10 mL/h via INTRAVENOUS

## 2012-02-28 MED ORDER — NITROGLYCERIN IN D5W 200-5 MCG/ML-% IV SOLN
1.5000 ug/min | INTRAVENOUS | Status: DC
  Administered 2012-02-28: 1.5 ug/min via INTRAVENOUS
  Administered 2012-02-28: 5 ug/min via INTRAVENOUS
  Filled 2012-02-28: qty 250

## 2012-02-28 MED ORDER — SODIUM CHLORIDE 0.9 % IV SOLN: 250.0000 mL | INTRAVENOUS | Status: DC | PRN

## 2012-02-28 MED ORDER — ASPIRIN 300 MG RE SUPP
300.0000 mg | RECTAL | Status: DC
  Filled 2012-02-28: qty 1

## 2012-02-28 MED ORDER — ACETAMINOPHEN 325 MG PO TABS: 650.0000 mg | ORAL_TABLET | ORAL | Status: DC | PRN

## 2012-02-28 MED ORDER — METFORMIN HCL 1000 MG PO TABS: 1000.0000 mg | ORAL_TABLET | Freq: Two times a day (BID) | ORAL | Status: DC

## 2012-02-28 MED ORDER — ALPRAZOLAM 0.25 MG PO TABS: 0.2500 mg | ORAL_TABLET | Freq: Two times a day (BID) | ORAL | Status: DC | PRN

## 2012-02-28 MED ORDER — HEPARIN (PORCINE) IN NACL 2-0.9 UNIT/ML-% IJ SOLN
INTRAMUSCULAR | Status: AC
  Filled 2012-02-28: qty 2000

## 2012-02-28 MED ORDER — NITROGLYCERIN 0.4 MG SL SUBL: 0.4000 mg | SUBLINGUAL_TABLET | SUBLINGUAL | Status: DC | PRN

## 2012-02-28 MED ORDER — HEPARIN BOLUS VIA INFUSION
4000.0000 [IU] | Freq: Once | INTRAVENOUS | Status: AC
  Administered 2012-02-28: 4000 [IU] via INTRAVENOUS
  Filled 2012-02-28: qty 4000

## 2012-02-28 MED ORDER — ASPIRIN EC 81 MG PO TBEC: 81.0000 mg | DELAYED_RELEASE_TABLET | Freq: Every day | ORAL | Status: DC

## 2012-02-28 MED ORDER — METOPROLOL TARTRATE 25 MG PO TABS
25.0000 mg | ORAL_TABLET | Freq: Two times a day (BID) | ORAL | Status: DC
  Administered 2012-02-28: 25 mg via ORAL
  Filled 2012-02-28 (×2): qty 1

## 2012-02-28 MED ORDER — NITROGLYCERIN 0.2 MG/ML ON CALL CATH LAB
INTRAVENOUS | Status: AC
  Filled 2012-02-28: qty 1

## 2012-02-28 MED ORDER — ASPIRIN 81 MG PO TBEC: 81.0000 mg | DELAYED_RELEASE_TABLET | Freq: Every day | ORAL | Status: AC

## 2012-02-28 MED ORDER — PNEUMOCOCCAL VAC POLYVALENT 25 MCG/0.5ML IJ INJ
0.5000 mL | INJECTION | Freq: Once | INTRAMUSCULAR | Status: DC
  Filled 2012-02-28: qty 0.5

## 2012-02-28 MED ORDER — SIMVASTATIN 20 MG PO TABS: 20.0000 mg | ORAL_TABLET | Freq: Every day | ORAL | Status: DC

## 2012-02-28 MED ORDER — MIDAZOLAM HCL 2 MG/2ML IJ SOLN
INTRAMUSCULAR | Status: AC
  Filled 2012-02-28: qty 2

## 2012-02-28 MED ORDER — SODIUM CHLORIDE 0.9 % IJ SOLN
3.0000 mL | Freq: Two times a day (BID) | INTRAMUSCULAR | Status: DC
  Administered 2012-02-28: 3 mL via INTRAVENOUS

## 2012-02-28 MED ORDER — SODIUM CHLORIDE 0.9 % IJ SOLN: 3.0000 mL | INTRAMUSCULAR | Status: DC | PRN

## 2012-02-28 MED ORDER — DIAZEPAM 5 MG PO TABS
10.0000 mg | ORAL_TABLET | ORAL | Status: AC
  Administered 2012-02-28: 10 mg via ORAL
  Filled 2012-02-28: qty 2

## 2012-02-28 NOTE — Progress Notes (Signed)
Pt admitted with orders to start nitro drip and maintain SBP > 90.  IV nitro started at 17mcg/min.  Pt BP 107 systolic when nitro drip started.  Pt BP now 99/50, approximately 2 hours later.  PA paged.  Orders received to decrease nitro drip to 1.5 mcg.  Will continue to monitor pt.  Efraim Kaufmann

## 2012-02-28 NOTE — Patient Instructions (Signed)
We are going to admit you to the hospital.  

## 2012-02-28 NOTE — Progress Notes (Signed)
ANTICOAGULATION CONSULT NOTE - Initial Consult  Pharmacy Consult for heparin Indication: chest pain/ACS  No Known Allergies  Patient Measurements: Height: 5' 8.11" (173 cm) Weight: 204 lb 9.4 oz (92.8 kg) IBW/kg (Calculated) : 64.15  Heparin Dosing Weight:  Vital Signs: BP: 134/62 mmHg (06/14 1027) Pulse Rate: 91  (06/14 1027)  Labs: No results found for this basename: HGB:2,HCT:3,PLT:3,APTT:3,LABPROT:3,INR:3,HEPARINUNFRC:3,CREATININE:3,CKTOTAL:3,CKMB:3,TROPONINI:3 in the last 72 hours  CrCl is unknown because no creatinine reading has been taken.   Medical History: Past Medical History  Diagnosis Date  . HTN (hypertension)   . Hypercholesterolemia   . DM (diabetes mellitus)   . Seasonal allergies   . Irregular heartbeat   . Sleep apnea     does not tolerate CPAP  . S/P cardiac catheterization     normal in 2004  . Obesity     Medications:  Prescriptions prior to admission  Medication Sig Dispense Refill  . aspirin 325 MG EC tablet Take 325 mg by mouth daily.        . calcium carbonate (OS-CAL) 600 MG TABS Take 1,200 mg by mouth daily.        [name] diltiazem (CARDIZEM CD) 240 MG 24 hr capsule Take 240 mg by mouth daily.      . fenofibrate micronized (LOFIBRA) 134 MG capsule Take 134 mg by mouth daily before breakfast.      . insulin detemir (LEVEMIR) 100 UNIT/ML injection Inject 80 Units into the skin daily.        . insulin glulisine (APIDRA SOLOSTAR) 100 UNIT/ML injection Inject into the skin 3 (three) times daily before meals. 10-20 units      . loratadine (CLARITIN) 10 MG tablet Take 10 mg by mouth daily.      [name] losartan-hydrochlorothiazide (HYZAAR) 100-25 MG per tablet Take 1 tablet by mouth daily.        . metFORMIN (GLUMETZA) 1000 MG (MOD) 24 hr tablet Take 1,000 mg by mouth 2 (two) times daily with a meal.        . simvastatin (ZOCOR) 20 MG tablet Take 20 mg by mouth at bedtime.          Assessment: 71 yo F admitted with chest pain and abnormal EKG. To start  heparin for r/o ACS.  Plan is cath later today hopefully.  Goal of Therapy:  Heparin level 0.3-0.7 units/ml Monitor platelets by anticoagulation protocol: Yes   Plan:  Give 4000 units bolus x 1 Start heparin infusion at 1100 units/hr Check anti-Xa level in 8 hours and daily while on heparin Continue to monitor H&H and platelets Herby Abraham, Pharm.D. [phone] 02/28/2012 12:22 PM

## 2012-02-28 NOTE — CV Procedure (Signed)
    Cardiac Catheterization Operative Report  Adrienne Cook [id number] 6/14/20134:56 PM Remus Loffler, PA  Procedure Performed:  1. Left Heart Catheterization 2. Selective Coronary Angiography 3. Left ventricular angiogram  Operator: Verne Carrow, MD  Arterial access site:  Right radial artery.   Indication:  Chest pain worrisome for unstable angina.                                 Procedure Details: The risks, benefits, complications, treatment options, and expected outcomes were discussed with the patient. The patient and/or family concurred with the proposed plan, giving informed consent. The patient was brought to the cath lab after IV hydration was begun and oral premedication was given. The patient was further sedated with Versed and Fentanyl. The right wrist was assessed with an Allens test which was positive. The right wrist was prepped and draped in a sterile fashion. 1% lidocaine was used for local anesthesia. Using the modified Seldinger access technique, a 5 French sheath was placed in the right radial artery. 3 mg Verapamil was given through the sheath. 5000 units IV heparin was given. Standard diagnostic catheters were used to perform selective coronary angiography. A pigtail catheter was used to perform a left ventricular angiogram. The sheath was removed from the right radial artery and a Terumo hemostasis band was applied at the arteriotomy site on the right wrist.   There were no immediate complications. The patient was taken to the recovery area in stable condition.   Hemodynamic Findings: Central aortic pressure: 97/52 Left ventricular pressure:  97/12/17  Angiographic Findings:  Left main: No angiographic evidence of disease.   Left Anterior Descending Artery: No angiographic evidence of disease.   Circumflex Artery: No angiographic evidence of disease.   Right Coronary Artery: No angiographic evidence of disease.   Left Ventricular Angiogram:  LVEF 65%.   Impression: 1. No angiographic evidence of coronary artery disease. 2. Normal LV systolic function 3. Non-cardiac chest pain  Recommendations: No further cardiac workup. Will d/c home tonight.        Complications:  None. The patient tolerated the procedure well.

## 2012-02-28 NOTE — Discharge Instructions (Signed)

## 2012-02-28 NOTE — Discharge Summary (Signed)
Patient ID: Adrienne Cook,  MRN: [id number], DOB/AGE: [birth year] 71 y.o.  Admit date: 02/28/2012 Discharge date: 02/28/2012  Primary Care Provider: Remus Loffler Primary Cardiologist: B. Crenshaw, MD  Discharge Diagnoses Principal Problem:  *Midsternal chest pain  **Normal coronary arteries on catheterization this admission. Active Problems:  Nausea & vomiting  DM  HYPERCHOLESTEROLEMIA  HYPERTENSION  Sleep apnea  Obesity  Allergies No Known Allergies  Procedures  Cardiac Catheterization 02/28/2012  Hemodynamic Findings: Central aortic pressure: 97/52 Left ventricular pressure:  97/12/17  Angiographic Findings: Left main: No angiographic evidence of disease.   Left Anterior Descending Artery: No angiographic evidence of disease.  Circumflex Artery: No angiographic evidence of disease.  Right Coronary Artery: No angiographic evidence of disease.   Left Ventricular Angiogram: LVEF 65%.  _____________  History of Present Illness  71 y/o female with prior h/o chest pain and negative cardiac evaluations in the past.  She was in her USOH until 4 days prior to admission when she had a episode of chest tightness associated with sob, weakness, and diaphoresis.  Symptoms were worse with exertion and improved with rest.  She did not seek medical attention and eventually symptoms eased off though they did recur over the course of the week on an intermittent basis.  On the morning of admission, after eating breakfast, she developed nausea and vomiting x 1.  She presented to [facility] office for evaluation and ECG was performed and showed new inferior ST depression and T wave changes.  Decision was made to admit her to [facility] for further evaluation and diagnostic catheterization.  Hospital Course  Upon arrival to [facility], pt was pain free.  She was initially maintained on low-dose IV ntg.  Her first set of cardiac enzymes returned normal.  She underwent diagnostic  catheterization this afternoon revealing normal coronary arteries and normal LV function.  She will be discharged this evening in good condition.  Discharge Vitals Blood pressure 98/48, pulse 66, temperature 97.9 F (36.6 C), temperature source Oral, resp. rate 17, height 5' 8.11" (1.73 m), weight 204 lb 9.4 oz (92.8 kg), SpO2 99.00%.  Filed Weights   02/28/12 1100  Weight: 204 lb 9.4 oz (92.8 kg)   Labs  CBC  Basename 02/28/12 1200  WBC 10.9*  NEUTROABS 8.4*  HGB 13.1  HCT 38.8  MCV 84.5  PLT 386   Basic Metabolic Panel  Basename 02/28/12 1200  NA 137  K 4.7  CL 101  CO2 22  GLUCOSE 127*  BUN 39*  CREATININE 1.17*  CALCIUM 10.1  MG 1.7  PHOS --   Liver Function Tests  Basename 02/28/12 1200  AST 27  ALT 27  ALKPHOS 72  BILITOT 0.2*  PROT 7.5  ALBUMIN 3.8   Cardiac Enzymes  Basename 02/28/12 1200  CKTOTAL 100  CKMB 2.7  CKMBINDEX --  TROPONINI <0.30   Disposition  Pt is being discharged home today in good condition.  Follow-up Plans & Appointments  Follow-up Information    Follow up with Olga Millers, MD in 2 weeks. (We will arrange.)    Contact information:   [address] [place], [place] [address] [place] [address] [phone]        Discharge Medications  Medication List  As of 02/28/2012  5:17 PM   TAKE these medications         APIDRA SOLOSTAR 100 UNIT/ML injection   Generic drug: insulin glulisine   Inject 8-18 Units into the skin 3 (  three) times daily before meals. Per sliding scale      aspirin 81 MG EC tablet   Take 1 tablet (81 mg total) by mouth daily.      calcium carbonate 600 MG Tabs   Commonly known as: OS-CAL   Take 1,200 mg by mouth daily.      diltiazem 240 MG 24 hr capsule   Commonly known as: CARDIZEM CD   Take 240 mg by mouth daily.      fenofibrate micronized 134 MG capsule   Commonly known as: LOFIBRA   Take 134 mg by mouth every evening.      insulin detemir 100 UNIT/ML  injection   Commonly known as: LEVEMIR   Inject 80 Units into the skin daily.      loratadine 10 MG tablet   Commonly known as: CLARITIN   Take 10 mg by mouth daily as needed. For allergies      losartan-hydrochlorothiazide 100-25 MG per tablet   Commonly known as: HYZAAR   Take 1 tablet by mouth daily.      metFORMIN 1000 MG tablet - **TO BE RESUMED ON 03/01/2012**   Commonly known as: GLUCOPHAGE   Take 1 tablet (1,000 mg total) by mouth 2 (two) times daily with a meal.      simvastatin 20 MG tablet   Commonly known as: ZOCOR   Take 20 mg by mouth at bedtime.          Outstanding Labs/Studies  None  Duration of Discharge Encounter   Greater than 30 minutes including physician time.  Signed, Nicolasa Ducking NP 02/28/2012, 5:21 PM

## 2012-02-28 NOTE — H&P (Signed)
   Adrienne Cook   02/28/2012 10:30 AM Office Visit  MRN: [id number]   Description: 71 year old female  Provider: Kimley Apsey, NP  Department: [facility]        Referring Provider     Brian S Crenshaw, MD      Diagnoses     Chest pain, unspecified   - Primary    786.50      Reason for Visit     Chest Pain    Work in visit. Seen for Dr. Crenshaw.          Progress Notes     Adrienne Senft, NP  02/28/2012 10:56 AM  Pended    Adrienne Cook Date of Birth: 03/31/1941 Medical Record #[id number]   History of Present Illness: Adrienne Cook is seen today for a work in visit. She is seen for Dr. Crenshaw. She has no known CAD with remote cath in 2004. No recent stress testing. Had an echo in 2011 showing vigorous LV function and a mid cavitary gradient as well as mild left atrial enlargment. Cardionet showed sinus. Negative stress echo then as well.    She presents today for a work in visit. She is here with her husband. She had a spell of chest tightness, shortness of breath and weakness on Monday. She was diaphoretic. It was worse with exertion and relieved with lying down. She refused to go to the ER. Has continued to have symptoms off and on since then. Today after eating some cereal had nausea and vomited x 1. Still with chest tightness. Given NTG sl x 1 with some relief. EKG is abnormal. She apparently has been on phentermine recently. A1C is reported to be over 7.     Current Outpatient Prescriptions on File Prior to Visit   Medication  Sig  Dispense  Refill   .  aspirin 325 MG EC tablet  Take 325 mg by mouth daily.           .  calcium carbonate (OS-CAL) 600 MG TABS  Take 1,200 mg by mouth daily.           .  fenofibrate micronized (LOFIBRA) 134 MG capsule  Take 134 mg by mouth daily before breakfast.         .  insulin detemir (LEVEMIR) 100 UNIT/ML injection  Inject 80 Units into the skin daily.           .  insulin glulisine (APIDRA SOLOSTAR) 100 UNIT/ML injection   Inject into the skin 3 (three) times daily before meals. 10-20 units         .  loratadine (CLARITIN) 10 MG tablet  Take 10 mg by mouth daily.         .  losartan-hydrochlorothiazide (HYZAAR) 100-25 MG per tablet  Take 1 tablet by mouth daily.           .  metFORMIN (GLUMETZA) 1000 MG (MOD) 24 hr tablet  Take 1,000 mg by mouth 2 (two) times daily with a meal.           .  simvastatin (ZOCOR) 20 MG tablet  Take 20 mg by mouth at bedtime.              No Known Allergies    Past Medical History   Diagnosis  Date   .  HTN (hypertension)     .  Hypercholesterolemia     .  DM (diabetes mellitus)     .    Seasonal allergies     .  Irregular heartbeat     .  Sleep apnea         does not tolerate CPAP   .  S/P cardiac catheterization         normal in 2004   .  Obesity         Past Surgical History   Procedure  Date   .  Cholecystectomy     .  Vaginal hysterectomy  1978   .  Examination under anesthesia         and sphincterotomy   .  Carpal tunnel release     .  Appendectomy     .  Hammer toe surgery         bilateral; 5th toes   .  Breast cyst excision         benign       History   Smoking status   .  Never Smoker    Smokeless tobacco   .  Never Used   Comment: tobacco use - no       History   Alcohol Use  No       Family History   Problem  Relation  Age of Onset   .  Heart failure  Mother         CHF    .  Colon cancer  Neg Hx     .  Stomach cancer  Neg Hx        Review of Systems: The review of systems is per the HPI.  All other systems were reviewed and are negative.   Physical Exam: BP 134/62  Pulse 91  Ht 5' 8" (1.727 m)  Wt 204 lb 9.6 oz (92.806 kg)  BMI 31.11 kg/m2 Blood pressure 122/60 after NTG. Patient is quite anxious and tearful. She appears to be in no acute distress. Skin is warm and dry. Color is normal.  HEENT is unremarkable. Normocephalic/atraumatic. PERRL. Sclera are nonicteric. Neck is supple. No masses. No JVD. Lungs are clear.  Cardiac exam shows a regular rate and rhythm. Abdomen is soft. Extremities are without edema. Gait and ROM are intact. No gross neurologic deficits noted.     LABORATORY DATA: EKG shows inferior ST depression which is new compared to old tracings. Tracing is reviewed with Dr. Brackbill.       Assessment / Plan:        CHEST PAIN - Adrienne Bristow, NP  02/28/2012 10:59 AM  Signed Patient presents with chest pain. Associated with shortness of breath and diaphoresis. Had nausea and vomiting this am. EKG shows new inferior depression. She has multiple CV risk factors which include HTN, DM, HLD and obesity. She is subsequently seen with Dr. Brackbill. Will arrange for admission and hope to proceed on with cardiac cath later today. NTG was given x 1 with some relief. Patient is transferred per EMS.     Electronic signature on 02/28/2012          Vitals - Last Recorded       BP Pulse Ht Wt BMI      134/62 91 5' 8" (1.727 m) 204 lb 9.6 oz (92.806 kg) 31.11 kg/m2         Vitals History Recorded       Orders Placed This Encounter     EKG 12-Lead [EKG1 Custom]         Discontinued Medications           Reason for Discontinue    diltiazem (CARDIZEM CD) 180 MG 24 hr capsule Dose change    FENOFIBRATE PO Dose change    niacin (NIASPAN) 1000 MG CR tablet Change in therapy    fish oil-omega-3 fatty acids 1000 MG capsule Error    UNABLE TO FIND Entry Error       Level of Service     LOS - NO CHARGE [NC1]      Follow-up and Disposition     Routing History Recorded         Patient Instructions     We are going to admit you to the hospital.             Previous Visit         Provider Department Encounter #    02/26/2012 11:04 AM Thomas Brackbill, MD [facility] [address] [id number]              

## 2012-02-28 NOTE — Assessment & Plan Note (Signed)
Patient presents with chest pain. Associated with shortness of breath and diaphoresis. Had nausea and vomiting this am. EKG shows new inferior depression. She has multiple CV risk factors which include HTN, DM, HLD and obesity. She is subsequently seen with Dr. Patty Sermons. Will arrange for admission and hope to proceed on with cardiac cath later today. NTG was given x 1 with some relief. Patient is transferred per EMS.

## 2012-02-28 NOTE — Interval H&P Note (Signed)
History and Physical Interval Note:  02/28/2012 4:05 PM  Adrienne Cook  has presented today for surgery, with the diagnosis of chest pain  The various methods of treatment have been discussed with the patient and family. After consideration of risks, benefits and other options for treatment, the patient has consented to  Procedure(s) (LRB): LEFT HEART CATHETERIZATION WITH CORONARY ANGIOGRAM (N/A) as a surgical intervention .  The patients' history has been reviewed, patient examined, no change in status, stable for surgery.  I have reviewed the patients' chart and labs.  Questions were answered to the patient's satisfaction.     Adrienne Cook

## 2012-02-28 NOTE — Progress Notes (Signed)
Adrienne Cook Date of Birth: 1940/09/21 Medical Record #[id number]  History of Present Illness: Adrienne Cook is seen today for a work in visit. She is seen for Dr. Jens Som. She has no known CAD with remote cath in 2004. No recent stress testing. Had an echo in 2011 showing vigorous LV function and a mid cavitary gradient as well as mild left atrial enlargment. Cardionet showed sinus. Negative stress echo then as well.   She presents today for a work in visit. She is here with her husband. She had a spell of chest tightness, shortness of breath and weakness on Monday. She was diaphoretic. It was worse with exertion and relieved with lying down. She refused to go to the ER. Has continued to have symptoms off and on since then. Today after eating some cereal had nausea and vomited x 1. Still with chest tightness. Given NTG sl x 1 with some relief. EKG is abnormal.   Current Outpatient Prescriptions on File Prior to Visit  Medication Sig Dispense Refill  . aspirin 325 MG EC tablet Take 325 mg by mouth daily.        . calcium carbonate (OS-CAL) 600 MG TABS Take 1,200 mg by mouth daily.        . fenofibrate micronized (LOFIBRA) 134 MG capsule Take 134 mg by mouth daily before breakfast.      . insulin detemir (LEVEMIR) 100 UNIT/ML injection Inject 80 Units into the skin daily.        . insulin glulisine (APIDRA SOLOSTAR) 100 UNIT/ML injection Inject into the skin 3 (three) times daily before meals. 10-20 units      . loratadine (CLARITIN) 10 MG tablet Take 10 mg by mouth daily.      [name] losartan-hydrochlorothiazide (HYZAAR) 100-25 MG per tablet Take 1 tablet by mouth daily.        . metFORMIN (GLUMETZA) 1000 MG (MOD) 24 hr tablet Take 1,000 mg by mouth 2 (two) times daily with a meal.        . simvastatin (ZOCOR) 20 MG tablet Take 20 mg by mouth at bedtime.          No Known Allergies  Past Medical History  Diagnosis Date  . HTN (hypertension)   . Hypercholesterolemia   . DM (diabetes mellitus)     . Seasonal allergies   . Irregular heartbeat   . Sleep apnea     does not tolerate CPAP  . S/P cardiac catheterization     normal in 2004  . Obesity     Past Surgical History  Procedure Date  . Cholecystectomy   . Vaginal hysterectomy 1978  . Examination under anesthesia     and sphincterotomy  . Carpal tunnel release   . Appendectomy   . Hammer toe surgery     bilateral; 5th toes  . Breast cyst excision     benign    History  Smoking status  . Never Smoker   Smokeless tobacco  . Never Used  Comment: tobacco use - no    History  Alcohol Use No    Family History  Problem Relation Age of Onset  . Heart failure Mother     CHF   . Colon cancer Neg Hx   . Stomach cancer Neg Hx     Review of Systems: The review of systems is per the HPI.  All other systems were reviewed and are negative.  Physical Exam: BP 134/62  Pulse 91  Ht 5\' 8"  (1.727  m)  Wt 204 lb 9.6 oz (92.806 kg)  BMI 31.11 kg/m2 Blood pressure 122/60 after NTG. Patient is quite anxious and tearful. She appears to be in no acute distress. Skin is warm and dry. Color is normal.  HEENT is unremarkable. Normocephalic/atraumatic. PERRL. Sclera are nonicteric. Neck is supple. No masses. No JVD. Lungs are clear. Cardiac exam shows a regular rate and rhythm. Abdomen is soft. Extremities are without edema. Gait and ROM are intact. No gross neurologic deficits noted.   LABORATORY DATA: EKG shows inferior ST depression which is new compared to old tracings. Tracing is reviewed with Dr. Patty Sermons.    Assessment / Plan:

## 2012-02-28 NOTE — H&P (View-Only) (Signed)
Adrienne Cook   02/28/2012 10:30 AM Office Visit  MRN: [id number]   Description: 71 year old female  Provider: Norma Fredrickson, NP  Department: [facility]        Referring Provider     Lewayne Bunting, MD      Diagnoses     Chest pain, unspecified   - Primary    786.50      Reason for Visit     Chest Pain    Work in visit. Seen for Dr. Jens Som.          Progress Notes     Norma Fredrickson, NP  02/28/2012 10:56 AM  Pended    [name] Date of Birth: [birth year] Medical Record #[id number]   History of Present Illness: Adrienne Cook is seen today for a work in visit. She is seen for Dr. Jens Som. She has no known CAD with remote cath in 2004. No recent stress testing. Had an echo in 2011 showing vigorous LV function and a mid cavitary gradient as well as mild left atrial enlargment. Cardionet showed sinus. Negative stress echo then as well.    She presents today for a work in visit. She is here with her husband. She had a spell of chest tightness, shortness of breath and weakness on Monday. She was diaphoretic. It was worse with exertion and relieved with lying down. She refused to go to the ER. Has continued to have symptoms off and on since then. Today after eating some cereal had nausea and vomited x 1. Still with chest tightness. Given NTG sl x 1 with some relief. EKG is abnormal. She apparently has been on phentermine recently. A1C is reported to be over 7.     Current Outpatient Prescriptions on File Prior to Visit   Medication  Sig  Dispense  Refill   .  aspirin 325 MG EC tablet  Take 325 mg by mouth daily.           .  calcium carbonate (OS-CAL) 600 MG TABS  Take 1,200 mg by mouth daily.           .  fenofibrate micronized (LOFIBRA) 134 MG capsule  Take 134 mg by mouth daily before breakfast.         .  insulin detemir (LEVEMIR) 100 UNIT/ML injection  Inject 80 Units into the skin daily.           .  insulin glulisine (APIDRA SOLOSTAR) 100 UNIT/ML injection   Inject into the skin 3 (three) times daily before meals. 10-20 units         .  loratadine (CLARITIN) 10 MG tablet  Take 10 mg by mouth daily.         [name]  losartan-hydrochlorothiazide (HYZAAR) 100-25 MG per tablet  Take 1 tablet by mouth daily.           .  metFORMIN (GLUMETZA) 1000 MG (MOD) 24 hr tablet  Take 1,000 mg by mouth 2 (two) times daily with a meal.           .  simvastatin (ZOCOR) 20 MG tablet  Take 20 mg by mouth at bedtime.              No Known Allergies    Past Medical History   Diagnosis  Date   .  HTN (hypertension)     .  Hypercholesterolemia     .  DM (diabetes mellitus)     .  Seasonal allergies     .  Irregular heartbeat     .  Sleep apnea         does not tolerate CPAP   .  S/P cardiac catheterization         normal in 2004   .  Obesity         Past Surgical History   Procedure  Date   .  Cholecystectomy     .  Vaginal hysterectomy  1978   .  Examination under anesthesia         and sphincterotomy   .  Carpal tunnel release     .  Appendectomy     .  Hammer toe surgery         bilateral; 5th toes   .  Breast cyst excision         benign       History   Smoking status   .  Never Smoker    Smokeless tobacco   .  Never Used   Comment: tobacco use - no       History   Alcohol Use  No       Family History   Problem  Relation  Age of Onset   .  Heart failure  Mother         CHF    .  Colon cancer  Neg Hx     .  Stomach cancer  Neg Hx        Review of Systems: The review of systems is per the HPI.  All other systems were reviewed and are negative.   Physical Exam: BP 134/62  Pulse 91  Ht 5\' 8"  (1.727 m)  Wt 204 lb 9.6 oz (92.806 kg)  BMI 31.11 kg/m2 Blood pressure 122/60 after NTG. Patient is quite anxious and tearful. She appears to be in no acute distress. Skin is warm and dry. Color is normal.  HEENT is unremarkable. Normocephalic/atraumatic. PERRL. Sclera are nonicteric. Neck is supple. No masses. No JVD. Lungs are clear.  Cardiac exam shows a regular rate and rhythm. Abdomen is soft. Extremities are without edema. Gait and ROM are intact. No gross neurologic deficits noted.     LABORATORY DATA: EKG shows inferior ST depression which is new compared to old tracings. Tracing is reviewed with Dr. Patty Sermons.       Assessment / Plan:        CHEST PAIN - Norma Fredrickson, NP  02/28/2012 10:59 AM  Signed Patient presents with chest pain. Associated with shortness of breath and diaphoresis. Had nausea and vomiting this am. EKG shows new inferior depression. She has multiple CV risk factors which include HTN, DM, HLD and obesity. She is subsequently seen with Dr. Patty Sermons. Will arrange for admission and hope to proceed on with cardiac cath later today. NTG was given x 1 with some relief. Patient is transferred per EMS.     Electronic signature on 02/28/2012          Vitals - Last Recorded       BP Pulse Ht Wt BMI      134/62 91 5\' 8"  (1.727 m) 204 lb 9.6 oz (92.806 kg) 31.11 kg/m2         Vitals History Recorded       Orders Placed This Encounter     EKG 12-Lead [EKG1 Custom]         Discontinued Medications  Reason for Discontinue    diltiazem (CARDIZEM CD) 180 MG 24 hr capsule Dose change    FENOFIBRATE PO Dose change    niacin (NIASPAN) 1000 MG CR tablet Change in therapy    fish oil-omega-3 fatty acids 1000 MG capsule Error    UNABLE TO FIND Entry Error       Level of Service     LOS - NO CHARGE [NC1]      Follow-up and Disposition     Routing History Recorded         Patient Instructions     We are going to admit you to the hospital.             Previous Visit         Provider Department Encounter #    02/26/2012 11:04 AM Cassell Clement, MD [facility] [id number]

## 2012-02-28 NOTE — Progress Notes (Signed)
Utilization Review Completed.Dorcas Carrow T6/14/2013

## 2012-03-02 LAB — GLUCOSE, CAPILLARY: Glucose-Capillary: 73 mg/dL (ref 70–99)

## 2012-03-06 ENCOUNTER — Other Ambulatory Visit: Payer: Self-pay | Admitting: *Deleted

## 2012-03-06 DIAGNOSIS — R9389 Abnormal findings on diagnostic imaging of other specified body structures: Secondary | ICD-10-CM

## 2012-03-11 ENCOUNTER — Ambulatory Visit (INDEPENDENT_AMBULATORY_CARE_PROVIDER_SITE_OTHER)
Admission: RE | Admit: 2012-03-11 | Discharge: 2012-03-11 | Disposition: A | Discharge status: Home / Self Care | Payer: Medicare Other | Source: Ambulatory Visit | Attending: Nurse Practitioner | Admitting: Nurse Practitioner

## 2012-03-11 DIAGNOSIS — R918 Other nonspecific abnormal finding of lung field: Secondary | ICD-10-CM

## 2012-03-11 DIAGNOSIS — R9389 Abnormal findings on diagnostic imaging of other specified body structures: Secondary | ICD-10-CM

## 2012-03-16 ENCOUNTER — Encounter: Payer: Self-pay | Admitting: Nurse Practitioner

## 2012-03-16 ENCOUNTER — Ambulatory Visit (INDEPENDENT_AMBULATORY_CARE_PROVIDER_SITE_OTHER): Payer: Medicare Other | Admitting: Nurse Practitioner

## 2012-03-16 VITALS — BP 130/80 | HR 83 | Ht 68.0 in | Wt 206.6 lb

## 2012-03-16 DIAGNOSIS — N289 Disorder of kidney and ureter, unspecified: Secondary | ICD-10-CM

## 2012-03-16 DIAGNOSIS — R079 Chest pain, unspecified: Secondary | ICD-10-CM

## 2012-03-16 DIAGNOSIS — N1832 Chronic kidney disease, stage 3b: Secondary | ICD-10-CM | POA: Insufficient documentation

## 2012-03-16 DIAGNOSIS — I10 Essential (primary) hypertension: Secondary | ICD-10-CM

## 2012-03-16 LAB — BASIC METABOLIC PANEL
BUN: 20 mg/dL (ref 6–23)
CO2: 26 mEq/L (ref 19–32)
Calcium: 9.8 mg/dL (ref 8.4–10.5)
Chloride: 103 mEq/L (ref 96–112)
Creatinine, Ser: 1 mg/dL (ref 0.4–1.2)
GFR: 58.35 mL/min — ABNORMAL LOW (ref >60.00)
Glucose, Bld: 152 mg/dL — ABNORMAL HIGH (ref 70–99)
Potassium: 3.9 mEq/L (ref 3.5–5.1)
Sodium: 139 mEq/L (ref 135–145)

## 2012-03-16 NOTE — Patient Instructions (Signed)
We are going to recheck your renal function today  Stay on your current medicines  Here are my tips to lose weight:  1. Drink only water. You do not need milk, juice, tea, soda or diet soda.  2. Do not eat anything "white". This includes white bread, potatoes, rice or mayo  3. Stay away from fried foods and sweets  4. Your portion should be the size of the palm of your hand.  5. Know what your weaknesses are and avoid.  6. Find an exercise you like and do it every day for 45 to 60 minutes.       We will see you back in 6 months.   Call the [facility] office at [phone] if you have any questions, problems or concerns.

## 2012-03-16 NOTE — Assessment & Plan Note (Signed)
Patient is s/p recent admission for chest pain. Has had a normal cath with normal LV function. She is feeling better clinically and has no recurrent symptoms. Did have an abnormal CXR but the follow up was normal. Will recheck her BMET today. Encouraged risk factor modification with exercise and weight loss. We will see her back in 6 months. Patient is agreeable to this plan and will call if any problems develop in the interim.

## 2012-03-16 NOTE — Progress Notes (Signed)
Adrienne Cook Date of Birth: May 13, 1941 Medical Record #[id number]  History of Present Illness: Adrienne Cook is seen back today for a post hospital visit. She is seen for Dr. Jens Som. She was here about 2 weeks ago with chest pain, diaphoresis, nausea and EKG changes. She was admitted. She had repeat cath which was normal. She did have an abnormal CXR but has already had it reapeated and is now normal. Her other problems include DM, HTN, OSA, obesity and HLD. Last echo in 2011 showed vigorous LV function with a mid cavitary gradient as well as mild LAE. She had an event monitor then as well which showed sinus.   She comes in today. She is here with her husband. She is doing well. No further chest pain. She feels good. Says she is going to try and work on her diet. The cost of her insulin is going up and she does wish to be on less medicine. She has had no problems with her cath site (right wrist). Not short of breath. Not dizzy or lightheaded. She has her regular labs with her PCP and is due there at the end of this month. She is worried about her renal function and is on Metformin. We will recheck this today.   Current Outpatient Prescriptions on File Prior to Visit  Medication Sig Dispense Refill  . aspirin 81 MG EC tablet Take 1 tablet (81 mg total) by mouth daily.      . calcium carbonate (OS-CAL) 600 MG TABS Take 1,200 mg by mouth daily.        [name] diltiazem (CARDIZEM CD) 240 MG 24 hr capsule Take 240 mg by mouth daily.      . fenofibrate micronized (LOFIBRA) 134 MG capsule Take 134 mg by mouth every evening.       . insulin detemir (LEVEMIR) 100 UNIT/ML injection Inject 80 Units into the skin daily.        . insulin glulisine (APIDRA SOLOSTAR) 100 UNIT/ML injection Inject 8-18 Units into the skin 3 (three) times daily before meals. Per sliding scale      . loratadine (CLARITIN) 10 MG tablet Take 10 mg by mouth daily as needed. For allergies      . losartan-hydrochlorothiazide (HYZAAR)  100-25 MG per tablet Take 1 tablet by mouth daily.        . metFORMIN (GLUCOPHAGE) 1000 MG tablet Take 1 tablet (1,000 mg total) by mouth 2 (two) times daily with a meal.      . simvastatin (ZOCOR) 20 MG tablet Take 20 mg by mouth at bedtime.          No Known Allergies  Past Medical History  Diagnosis Date  . HTN (hypertension)   . Hypercholesterolemia   . Seasonal allergies   . Irregular heartbeat     a. normal event monitor  . Sleep apnea     does not tolerate CPAP  . Obesity   . PONV (postoperative nausea and vomiting)   . Chest pain     a. normal cath in 2004;  b. nl stress echo 2011;  c. 02/2012 Cath: nl cors, EF 65%.  . DM (diabetes mellitus)     insulin dependent  . 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 Cholecystectomy   . Vaginal hysterectomy 1978  . Examination under anesthesia     and sphincterotomy  . Carpal tunnel release   . Appendectomy   .  Hammer toe surgery     bilateral; 5th toes  . Breast cyst excision     benign  . Colonoscopy 02/13/2012    History  Smoking status  . Never Smoker   Smokeless tobacco  . Never Used  Comment: tobacco use - no    History  Alcohol Use No    Family History  Problem Relation Age of Onset  . Heart failure Mother     CHF   . Colon cancer Neg Hx   . Stomach cancer Neg Hx     Review of Systems: The review of systems is per the HPI.  All other systems were reviewed and are negative.  Physical Exam: BP 130/80  Pulse 83  Ht 5\' 8"  (1.727 m)  Wt 206 lb 9.6 oz (93.713 kg)  BMI 31.41 kg/m2  SpO2 97% Patient is very pleasant and in no acute distress. She is obese. Skin is warm and dry. Color is normal.  HEENT is unremarkable. Normocephalic/atraumatic. PERRL. Sclera are nonicteric. Neck is supple. No masses. No JVD. Lungs are clear. Cardiac exam shows a regular rate and rhythm. Abdomen is soft. Extremities are without edema. Gait and ROM are intact. No gross neurologic deficits  noted.  LABORATORY DATA:  Cardiac Cath Impression (02/28/2012):   1. No angiographic evidence of coronary artery disease.  2. Normal LV systolic function  3. Non-cardiac chest pain   Recommendations: No further cardiac workup. Will d/c home tonight.   CXR Dg IMPRESSION FROM 02/28/2012:  Patchy right lower lobe airspace disease compatible with pneumonia.   Follow up Chest 2 View  03/11/2012  *RADIOLOGY REPORT*  Clinical Data: History of abnormal chest radiograph examination. Follow-up.  Coughing.  CHEST - 2 VIEW  Comparison: 02/28/2012.  Findings: Cardiac silhouette is normal size and shape.  Mediastinal and hilar contours appear stable.  Lungs appear free of infiltrates.  No pulmonary nodules were evident. No pleural abnormality is evident.  There is minimal degenerative spondylosis.  IMPRESSION: No acute or active cardiopulmonary or pleural abnormalities are evident.  Original Report Authenticated By: Crawford Givens, M.D.   Lab Results  Component Value Date   WBC 10.9* 02/28/2012   HGB 13.1 02/28/2012   HCT 38.8 02/28/2012   PLT 386 02/28/2012   GLUCOSE 127* 02/28/2012   ALT 27 02/28/2012   AST 27 02/28/2012   NA 137 02/28/2012   K 4.7 02/28/2012   CL 101 02/28/2012   CREATININE 1.17* 02/28/2012   BUN 39* 02/28/2012   CO2 22 02/28/2012   TSH 0.996 02/28/2012   INR 1.10 02/28/2012   HGBA1C 7.5* 02/28/2012     Assessment / Plan:

## 2012-03-16 NOTE — Assessment & Plan Note (Signed)
We will recheck a BMET today. She is due to see her PCP later this month and will address the issue of her Metformin at that time.

## 2012-03-16 NOTE — Assessment & Plan Note (Signed)
Recheck of her blood pressure by me is improved. I encouraged her to monitor at home.

## 2012-03-17 ENCOUNTER — Telehealth: Payer: Self-pay | Admitting: Nurse Practitioner

## 2012-03-17 NOTE — Telephone Encounter (Signed)
Fu call °Patient returning your call °

## 2012-04-21 ENCOUNTER — Ambulatory Visit: Payer: Medicare Other | Admitting: Cardiology

## 2012-09-14 ENCOUNTER — Ambulatory Visit (INDEPENDENT_AMBULATORY_CARE_PROVIDER_SITE_OTHER): Payer: Medicare Other | Admitting: Cardiology

## 2012-09-14 ENCOUNTER — Encounter: Payer: Self-pay | Admitting: Cardiology

## 2012-09-14 VITALS — BP 130/60 | HR 86 | Wt 214.0 lb

## 2012-09-14 DIAGNOSIS — E7800 Pure hypercholesterolemia, unspecified: Secondary | ICD-10-CM

## 2012-09-14 DIAGNOSIS — R002 Palpitations: Secondary | ICD-10-CM

## 2012-09-14 DIAGNOSIS — I10 Essential (primary) hypertension: Secondary | ICD-10-CM

## 2012-09-14 DIAGNOSIS — R079 Chest pain, unspecified: Secondary | ICD-10-CM

## 2012-09-14 NOTE — Progress Notes (Signed)
HPI: Pleasant female for fu of chest pain and tachycardia. Cardiac catheterization in 2004 and repeat in June of 2013 showed normal coronary arteries and normal LV function. Patient has had intermittent palpitations for years. Echocardiogram in August of 2011 revealed vigorous LV function and a mid-cavitary gradient as well as mild left atrial enlargement. Cardionet showed sinus rhythm. She was last seen in July of 2013. Since then, the patient has dyspnea with more extreme activities but not with routine activities. It is relieved with rest. It is not associated with chest pain. There is no orthopnea, PND or pedal edema. There is no syncope or palpitations. There is no exertional chest pain.    Current Outpatient Prescriptions  Medication Sig Dispense Refill  . aspirin 81 MG EC tablet Take 1 tablet (81 mg total) by mouth daily.      . calcium carbonate (OS-CAL) 600 MG TABS Take 1,200 mg by mouth daily.        [name] diltiazem (CARDIZEM CD) 240 MG 24 hr capsule Take 240 mg by mouth daily.      . fenofibrate micronized (LOFIBRA) 134 MG capsule Take 134 mg by mouth every evening.       . Insulin Isophane Human (HUMULIN N [place]) Inject into the skin as directed.      . insulin regular (HUMULIN R) 100 units/mL injection Inject into the skin as directed.      . loratadine (CLARITIN) 10 MG tablet Take 10 mg by mouth daily as needed. For allergies      . losartan-hydrochlorothiazide (HYZAAR) 100-25 MG per tablet Take 1 tablet by mouth daily.        . metFORMIN (GLUCOPHAGE) 1000 MG tablet Take 1 tablet (1,000 mg total) by mouth 2 (two) times daily with a meal.      . simvastatin (ZOCOR) 20 MG tablet Take 20 mg by mouth at bedtime.           Past Medical History  Diagnosis Date  . HTN (hypertension)   . Hypercholesterolemia   . Seasonal allergies   . Irregular heartbeat     a. normal event monitor  . Sleep apnea     does not tolerate CPAP  . Obesity   . PONV (postoperative nausea and vomiting)   .  Chest pain     a. normal cath in 2004;  b. nl stress echo 2011;  c. 02/2012 Cath: nl cors, EF 65%.  . DM (diabetes mellitus)     insulin dependent  . 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 Cholecystectomy   . Vaginal hysterectomy 1978  . Examination under anesthesia     and sphincterotomy  . Carpal tunnel release   . Appendectomy   . Hammer toe surgery     bilateral; 5th toes  . Breast cyst excision     benign  . Colonoscopy 02/13/2012    History   Social History  . Marital Status: Married    Spouse Name: N/A    Number of Children: N/A  . Years of Education: N/A   Occupational History  . Not on file.   Social History Main Topics  . Smoking status: Never Smoker   . Smokeless tobacco: Never Used     Comment: tobacco use - no  . Alcohol Use: No  . Drug Use: No  . Sexually Active: Not Currently   Other Topics Concern  . Not on file  Social History Narrative   Married; full time.     ROS: no fevers or chills, productive cough, hemoptysis, dysphasia, odynophagia, melena, hematochezia, dysuria, hematuria, rash, seizure activity, orthopnea, PND, pedal edema, claudication. Remaining systems are negative.  Physical Exam: Well-developed well-nourished in no acute distress.  Skin is warm and dry.  HEENT is normal.  Neck is supple.  Chest is clear to auscultation with normal expansion.  Cardiovascular exam is regular rate and rhythm.  Abdominal exam nontender or distended. No masses palpated. Extremities show no edema. neuro grossly intact  ECG sinus rhythm at a rate of 86. No ST changes.

## 2012-09-14 NOTE — Assessment & Plan Note (Signed)
Cardiac catheterization showed no coronary disease. She has had no further symptoms. No plans for further evaluation.

## 2012-09-14 NOTE — Assessment & Plan Note (Signed)
No recent symptoms. Previous monitor showed sinus rhythm. Continue Cardizem.

## 2012-09-14 NOTE — Assessment & Plan Note (Signed)
Continue statin. Lipids and liver monitored by primary care. 

## 2012-09-14 NOTE — Patient Instructions (Addendum)
Your physician wants you to follow-up in: ONE YEAR WITH DR CRENSHAW You will receive a reminder letter in the mail two months in advance. If you don't receive a letter, please call our office to schedule the follow-up appointment.  

## 2012-09-14 NOTE — Assessment & Plan Note (Signed)
Blood pressure is controlled. Continue present medications. 

## 2013-09-10 ENCOUNTER — Encounter: Payer: Self-pay | Admitting: Cardiology

## 2013-09-10 ENCOUNTER — Encounter (INDEPENDENT_AMBULATORY_CARE_PROVIDER_SITE_OTHER): Payer: Self-pay

## 2013-09-10 ENCOUNTER — Ambulatory Visit (INDEPENDENT_AMBULATORY_CARE_PROVIDER_SITE_OTHER): Payer: Medicare Other | Admitting: Cardiology

## 2013-09-10 VITALS — BP 134/70 | HR 77 | Ht 67.0 in | Wt 205.0 lb

## 2013-09-10 DIAGNOSIS — R002 Palpitations: Secondary | ICD-10-CM

## 2013-09-10 DIAGNOSIS — E7800 Pure hypercholesterolemia, unspecified: Secondary | ICD-10-CM

## 2013-09-10 DIAGNOSIS — I10 Essential (primary) hypertension: Secondary | ICD-10-CM

## 2013-09-10 NOTE — Assessment & Plan Note (Signed)
Controlled on present medications. We'll continue.

## 2013-09-10 NOTE — Progress Notes (Signed)
HPI: FU chest pain and tachycardia. Cardiac catheterization in 2004 and repeat in June of 2013 showed normal coronary arteries and normal LV function. Patient has had intermittent palpitations for years. Echocardiogram in August of 2011 revealed vigorous LV function and a mid-cavitary gradient as well as mild left atrial enlargement. Cardionet showed sinus rhythm. She was last seen in Dec 2013. Since then, the patient denies any dyspnea on exertion, orthopnea, PND, pedal edema, palpitations, syncope or chest pain.     Current Outpatient Prescriptions  Medication Sig Dispense Refill  . aspirin 81 MG EC tablet Take 1 tablet (81 mg total) by mouth daily.      . calcium carbonate (OS-CAL) 600 MG TABS Take 1,200 mg by mouth daily.        [name] diltiazem (CARDIZEM CD) 240 MG 24 hr capsule Take 240 mg by mouth daily.      . fenofibrate micronized (LOFIBRA) 134 MG capsule Take 134 mg by mouth every evening.       . gabapentin (NEURONTIN) 300 MG capsule Take 300 mg by mouth daily.      . Insulin Isophane Human (HUMULIN N [place]) Inject into the skin as directed.      . insulin regular (HUMULIN R) 100 units/mL injection Inject into the skin as directed.      . loratadine (CLARITIN) 10 MG tablet Take 10 mg by mouth daily as needed. For allergies      . losartan-hydrochlorothiazide (HYZAAR) 100-25 MG per tablet Take 1 tablet by mouth daily.        . metFORMIN (GLUCOPHAGE) 1000 MG tablet Take 1 tablet (1,000 mg total) by mouth 2 (two) times daily with a meal.      . simvastatin (ZOCOR) 20 MG tablet Take 20 mg by mouth at bedtime.         No current facility-administered medications for this visit.     Past Medical History  Diagnosis Date  . HTN (hypertension)   . Hypercholesterolemia   . Seasonal allergies   . Irregular heartbeat     a. normal event monitor  . Sleep apnea     does not tolerate CPAP  . Obesity   . PONV (postoperative nausea and vomiting)   . Chest pain     a. normal cath in  2004;  b. nl stress echo 2011;  c. 02/2012 Cath: nl cors, EF 65%.  . DM (diabetes mellitus)     insulin dependent  . 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 Cholecystectomy    . Vaginal hysterectomy  1978  . Examination under anesthesia      and sphincterotomy  . Carpal tunnel release    . Appendectomy    . Hammer toe surgery      bilateral; 5th toes  . Breast cyst excision      benign  . Colonoscopy  02/13/2012    History   Social History  . Marital Status: Married    Spouse Name: N/A    Number of Children: N/A  . Years of Education: N/A   Occupational History  . Not on file.   Social History Main Topics  . Smoking status: Never Smoker   . Smokeless tobacco: Never Used     Comment: tobacco use - no  . Alcohol Use: No  . Drug Use: No  . Sexual Activity: Not Currently   Other Topics Concern  . Not  on file   Social History Narrative   Married; full time.     ROS: no fevers or chills, productive cough, hemoptysis, dysphasia, odynophagia, melena, hematochezia, dysuria, hematuria, rash, seizure activity, orthopnea, PND, pedal edema, claudication. Remaining systems are negative.  Physical Exam: Well-developed well-nourished in no acute distress.  Skin is warm and dry.  HEENT is normal.  Neck is supple.  Chest is clear to auscultation with normal expansion.  Cardiovascular exam is regular rate and rhythm.  Abdominal exam nontender or distended. No masses palpated. Extremities show no edema. neuro grossly intact  ECG sinus rhythm at a rate of 77. No ST changes.

## 2013-09-10 NOTE — Patient Instructions (Signed)
Your physician wants you to follow-up in: ONE YEAR WITH DR CRENSHAW You will receive a reminder letter in the mail two months in advance. If you don't receive a letter, please call our office to schedule the follow-up appointment.  

## 2013-09-10 NOTE — Assessment & Plan Note (Signed)
Continue statin. Lipids and liver monitored by primary care. 

## 2013-09-10 NOTE — Assessment & Plan Note (Signed)
Blood pressure controlled. Continue present medications. Potassium and renal function monitored by primary care. 

## 2014-08-25 ENCOUNTER — Encounter (HOSPITAL_COMMUNITY): Payer: Self-pay | Admitting: Cardiology

## 2014-10-27 ENCOUNTER — Encounter (HOSPITAL_COMMUNITY): Payer: Self-pay | Admitting: Emergency Medicine

## 2014-10-27 ENCOUNTER — Emergency Department (HOSPITAL_COMMUNITY)
Admission: EM | Admit: 2014-10-27 | Discharge: 2014-10-28 | Disposition: A | Discharge status: Home / Self Care | Payer: Medicare Other | Attending: Emergency Medicine | Admitting: Emergency Medicine

## 2014-10-27 ENCOUNTER — Emergency Department (HOSPITAL_COMMUNITY): Payer: Medicare Other

## 2014-10-27 DIAGNOSIS — S79911A Unspecified injury of right hip, initial encounter: Secondary | ICD-10-CM | POA: Diagnosis present

## 2014-10-27 DIAGNOSIS — E119 Type 2 diabetes mellitus without complications: Secondary | ICD-10-CM | POA: Diagnosis not present

## 2014-10-27 DIAGNOSIS — Z794 Long term (current) use of insulin: Secondary | ICD-10-CM | POA: Insufficient documentation

## 2014-10-27 DIAGNOSIS — Y9389 Activity, other specified: Secondary | ICD-10-CM | POA: Diagnosis not present

## 2014-10-27 DIAGNOSIS — Y998 Other external cause status: Secondary | ICD-10-CM | POA: Diagnosis not present

## 2014-10-27 DIAGNOSIS — Y9289 Other specified places as the place of occurrence of the external cause: Secondary | ICD-10-CM | POA: Diagnosis not present

## 2014-10-27 DIAGNOSIS — E780 Pure hypercholesterolemia: Secondary | ICD-10-CM | POA: Diagnosis not present

## 2014-10-27 DIAGNOSIS — I10 Essential (primary) hypertension: Secondary | ICD-10-CM | POA: Insufficient documentation

## 2014-10-27 DIAGNOSIS — W109XXA Fall (on) (from) unspecified stairs and steps, initial encounter: Secondary | ICD-10-CM | POA: Insufficient documentation

## 2014-10-27 DIAGNOSIS — Z79899 Other long term (current) drug therapy: Secondary | ICD-10-CM | POA: Insufficient documentation

## 2014-10-27 DIAGNOSIS — Z7982 Long term (current) use of aspirin: Secondary | ICD-10-CM | POA: Diagnosis not present

## 2014-10-27 DIAGNOSIS — Z9889 Other specified postprocedural states: Secondary | ICD-10-CM | POA: Insufficient documentation

## 2014-10-27 DIAGNOSIS — E669 Obesity, unspecified: Secondary | ICD-10-CM | POA: Insufficient documentation

## 2014-10-27 DIAGNOSIS — G4730 Sleep apnea, unspecified: Secondary | ICD-10-CM | POA: Insufficient documentation

## 2014-10-27 DIAGNOSIS — M1990 Unspecified osteoarthritis, unspecified site: Secondary | ICD-10-CM | POA: Insufficient documentation

## 2014-10-27 DIAGNOSIS — M25559 Pain in unspecified hip: Secondary | ICD-10-CM

## 2014-10-27 MED ORDER — ONDANSETRON HCL 4 MG/2ML IJ SOLN
4.0000 mg | Freq: Once | INTRAMUSCULAR | Status: AC
  Administered 2014-10-27: 4 mg via INTRAVENOUS

## 2014-10-27 MED ORDER — ONDANSETRON HCL 4 MG/2ML IJ SOLN
INTRAMUSCULAR | Status: AC
  Filled 2014-10-27: qty 2

## 2014-10-27 NOTE — ED Provider Notes (Signed)
CSN: [id number]     Arrival date & time 10/27/14  2137 History   First MD Initiated Contact with Patient 10/27/14 2154     Chief Complaint  Patient presents with  . Fall     (Consider location/radiation/quality/duration/timing/severity/associated sxs/prior Treatment) HPI Comments: Pt is a 74 y.o. female presenting to the ED after a fall this evening. Pt states she is in upstairs apartment and was walking downstairs to make sure door was shut, while on the last few steps fell. She denies losing consciousness, but states "I'm not sure what happened", had her hands on the railing and fell on her right buttocks but can't say exactly why. Denies any feelings of CP, palpitations, heart racing, dizziness or lightheadedness, changes in vision, weakness. She was able to stand up and bear weight on the right leg when EMS arrived. EMS gave morphine en route and pain was much improved/resolved after second dose. She did get nauseas and vomited a little after the second dose of morphine.   Patient is a 74 y.o. female presenting with fall. The history is provided by the patient. No language interpreter was used.  Fall This is a new problem. The current episode started today. The problem has been unchanged. Associated symptoms include myalgias (right hip). Pertinent negatives include no chest pain, coughing, fatigue, fever, headaches, nausea, neck pain, numbness, rash, vertigo, visual change, vomiting or weakness. Nothing aggravates the symptoms. Treatments tried: iv morphine EMS. The treatment provided significant (with morphine from EMS) relief.    Past Medical History  Diagnosis Date  . HTN (hypertension)   . Hypercholesterolemia   . Seasonal allergies   . Irregular heartbeat     a. normal event monitor  . Sleep apnea     does not tolerate CPAP  . Obesity   . PONV (postoperative nausea and vomiting)   . Chest pain     a. normal cath in 2004;  b. nl stress echo 2011;  c. 02/2012 Cath: nl cors, EF 65%.   . DM (diabetes mellitus)     insulin dependent  . 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 Cholecystectomy    . Vaginal hysterectomy  1978  . Examination under anesthesia      and sphincterotomy  . Carpal tunnel release    . Appendectomy    . Hammer toe surgery      bilateral; 5th toes  . Breast cyst excision      benign  . Colonoscopy  02/13/2012  . Left heart catheterization with coronary angiogram N/A 02/28/2012    Procedure: LEFT HEART CATHETERIZATION WITH CORONARY ANGIOGRAM;  Surgeon: Hillary Bow, MD;  Location[facility] CATH LAB;  Service: Cardiovascular;  Laterality: N/A;  . Left heart cath  02/28/2012    Procedure: LEFT HEART CATH;  Surgeon: Hillary Bow, MD;  Location: [facility] CATH LAB;  Service: Cardiovascular;;   Family History  Problem Relation Age of Onset  . Heart failure Mother     CHF   . Colon cancer Neg Hx   . Stomach cancer Neg Hx    History  Substance Use Topics  . Smoking status: Never Smoker   . Smokeless tobacco: Never Used     Comment: tobacco use - no  . Alcohol Use: No   OB History    No data available     Review of Systems  Constitutional: Negative for fever and fatigue.  Respiratory: Negative for cough  and shortness of breath.   Cardiovascular: Negative for chest pain, palpitations and leg swelling.  Gastrointestinal: Negative for nausea, vomiting, diarrhea, constipation and blood in stool.  Genitourinary: Negative for dysuria.  Musculoskeletal: Positive for myalgias (right hip). Negative for neck pain.  Skin: Negative for rash.  Neurological: Negative for dizziness, vertigo, seizures, syncope, weakness, light-headedness, numbness and headaches.  All other systems reviewed and are negative.     Allergies  Review of patient's allergies indicates no known allergies.  Home Medications   Prior to Admission medications   Medication Sig Start Date End Date Taking? Authorizing Provider  acetaminophen  (TYLENOL) 500 MG tablet Take 500 mg by mouth every 6 (six) hours as needed.   Yes Historical Provider, MD  aspirin 81 MG EC tablet Take 1 tablet (81 mg total) by mouth daily. 02/28/12  Yes Rogelia Mire, NP  Cholecalciferol (VITAMIN D3) 5000 UNITS TABS Take 5,000 Units by mouth daily.   Yes Historical Provider, MD  citalopram (CELEXA) 20 MG tablet Take 20 mg by mouth daily.   Yes Historical Provider, MD  diltiazem (CARDIZEM CD) 240 MG 24 hr capsule Take 240 mg by mouth daily.   Yes Historical Provider, MD  gabapentin (NEURONTIN) 400 MG capsule Take 400 mg by mouth at bedtime.   Yes Historical Provider, MD  ibuprofen (ADVIL,MOTRIN) 200 MG tablet Take 200 mg by mouth every 6 (six) hours as needed.   Yes Historical Provider, MD  Insulin Isophane Human (HUMULIN N [place]) Inject 15 Units into the skin 3 (three) times daily.    Yes Historical Provider, MD  insulin regular (HUMULIN R) 100 units/mL injection Inject 35 Units into the skin 2 (two) times daily before a meal.    Yes Historical Provider, MD  losartan-hydrochlorothiazide (HYZAAR) 100-25 MG per tablet Take 1 tablet by mouth daily.     Yes Historical Provider, MD  metFORMIN (GLUCOPHAGE) 1000 MG tablet Take 1 tablet (1,000 mg total) by mouth 2 (two) times daily with a meal. 02/28/12  Yes Rogelia Mire, NP  metFORMIN (GLUCOPHAGE) 1000 MG tablet Take 1,000 mg by mouth daily with breakfast.   Yes Historical Provider, MD  simvastatin (ZOCOR) 40 MG tablet Take 40 mg by mouth daily.   Yes Historical Provider, MD  HYDROcodone-acetaminophen (NORCO/VICODIN) 5-325 MG per tablet Take 1 tablet by mouth every 6 (six) hours as needed for severe pain. 10/28/14   Leone Brand, MD   BP 144/41 mmHg  Pulse 78  Temp(Src) 98.9 F (37.2 C) (Oral)  Resp 16  SpO2 96% Physical Exam  Constitutional: She is oriented to person, place, and time. She appears well-developed and well-nourished. No distress.  HENT:  Head: Normocephalic and atraumatic.  Eyes: EOM are  normal. Pupils are equal, round, and reactive to light.  Neck: Normal range of motion. Neck supple.  Cardiovascular: Normal rate, regular rhythm, normal heart sounds and intact distal pulses.  Exam reveals no gallop and no friction rub.   No murmur heard. Pulses:      Radial pulses are 2+ on the right side, and 2+ on the left side.       Dorsalis pedis pulses are 2+ on the right side, and 2+ on the left side.       Posterior tibial pulses are 2+ on the right side, and 2+ on the left side.  Abdominal: Soft. She exhibits no distension. There is no tenderness. There is no rebound and no guarding.  Musculoskeletal:  Right upper leg: She exhibits tenderness. She exhibits no swelling, no edema and no deformity.       Legs: Able to lift right leg off bed to 30* without pain. Pain when sitting up at 30*. No ecchymosis, swelling, or deformity noted at hip and area of pain.  Neurological: She is alert and oriented to person, place, and time. No cranial nerve deficit.  Skin: Skin is warm and dry. No rash noted.  Psychiatric: She has a normal mood and affect. Her behavior is normal.  Nursing note and vitals reviewed.   ED Course  Procedures (including critical care time) Labs Review Labs Reviewed - No data to display  Imaging Review Dg Hip Unilat With Pelvis 2-3 Views Right  10/27/2014   CLINICAL DATA:  Patient was walking down the stairs of her apartment and fell down 4-5 stairs. Right hip pain after the fall.  EXAM: RIGHT HIP (WITH PELVIS) 2-3 VIEWS  COMPARISON:  None.  FINDINGS: Degenerative changes in the lower lumbar spine and hips. Pelvis and right hip appear intact. No evidence of acute fracture or dislocation. No focal bone lesion or bone destruction. Soft tissues are unremarkable.  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 No acute fractures demonstrated.   Electronically Signed   By: Lucienne Capers M.D.   On: 10/27/2014 23:54     EKG Interpretation None      MDM     Final diagnoses:  Hip pain    Mechanical fall. Able to bear weight on affected right hip/leg. Plain films negative for fracture. Stable for discharge with fall prevention/safety, OTC pain management with hydrocodone for breakthrough.    Leone Brand, MD 10/28/14 [facility] [place]  Dorie Rank, MD 10/28/14 [phone]

## 2014-10-27 NOTE — ED Notes (Signed)
Pt arrives via [facility] from home post fall, per EMS fell down four steps. R hip pain, swelling and tender to touch. Weightbearing to R hip. 10MG  morphine on board. 20G IV placed in L hand.

## 2014-10-28 MED ORDER — HYDROCODONE-ACETAMINOPHEN 5-325 MG PO TABS: 1.0000 | ORAL_TABLET | Freq: Four times a day (QID) | ORAL | Status: DC | PRN

## 2014-10-28 NOTE — Discharge Instructions (Signed)
There was no evidence of fracture on the x-ray. Ice the painful area, do not place ice directly on the skin, for about 15 minutes 4-5 times a day for the first few days. Take tylenol or ibuprofen for pain, and if the pain gets very bad take the hydrocodone. You need to keep track of how much tylenol (acetaminophen) you are taking, as it is also in the hydrocodone. No more than 4000mg  in a 24 hour period. The hydrocodone will also make you feel sleepy, so no driving, do not walk down steps if you have to take it, and move slowly with support after taking it.  Fall Prevention and Home Safety Falls cause injuries and can affect all age groups. It is possible to use preventive measures to significantly decrease the likelihood of falls. There are many simple measures which can make your home safer and prevent falls. OUTDOORS  Repair cracks and edges of walkways and driveways.  Remove high doorway thresholds.  Trim shrubbery on the main path into your home.  Have good outside lighting.  Clear walkways of tools, rocks, debris, and clutter.  Check that handrails are not broken and are securely fastened. Both sides of steps should have handrails.  Have leaves, snow, and ice cleared regularly.  Use sand or salt on walkways during winter months.  In the garage, clean up grease or oil spills. BATHROOM  Install night lights.  Install grab bars by the toilet and in the tub and shower.  Use non-skid mats or decals in the tub or shower.  Place a plastic non-slip stool in the shower to sit on, if needed.  Keep floors dry and clean up all water on the floor immediately.  Remove soap buildup in the tub or shower on a regular basis.  Secure bath mats with non-slip, double-sided rug tape.  Remove throw rugs and tripping hazards from the floors. BEDROOMS  Install night lights.  Make sure a bedside light is easy to reach.  Do not use oversized bedding.  Keep a telephone by your  bedside.  Have a firm chair with side arms to use for getting dressed.  Remove throw rugs and tripping hazards from the floor. KITCHEN  Keep handles on pots and pans turned toward the center of the stove. Use back burners when possible.  Clean up spills quickly and allow time for drying.  Avoid walking on wet floors.  Avoid hot utensils and knives.  Position shelves so they are not too high or low.  Place commonly used objects within easy reach.  If necessary, use a sturdy step stool with a grab bar when reaching.  Keep electrical cables out of the way.  Do not use floor polish or wax that makes floors slippery. If you must use wax, use non-skid floor wax.  Remove throw rugs and tripping hazards from the floor. STAIRWAYS  Never leave objects on stairs.  Place handrails on both sides of stairways and use them. Fix any loose handrails. Make sure handrails on both sides of the stairways are as long as the stairs.  Check carpeting to make sure it is firmly attached along stairs. Make repairs to worn or loose carpet promptly.  Avoid placing throw rugs at the top or bottom of stairways, or properly secure the rug with carpet tape to prevent slippage. Get rid of throw rugs, if possible.  Have an electrician put in a light switch at the top and bottom of the stairs. OTHER FALL PREVENTION TIPS  Wear low-heel or rubber-soled shoes that are supportive and fit well. Wear closed toe shoes.  When using a stepladder, make sure it is fully opened and both spreaders are firmly locked. Do not climb a closed stepladder.  Add color or contrast paint or tape to grab bars and handrails in your home. Place contrasting color strips on first and last steps.  Learn and use mobility aids as needed. Install an electrical emergency response system.  Turn on lights to avoid dark areas. Replace light bulbs that burn out immediately. Get light switches that glow.  Arrange furniture to create clear  pathways. Keep furniture in the same place.  Firmly attach carpet with non-skid or double-sided tape.  Eliminate uneven floor surfaces.  Select a carpet pattern that does not visually hide the edge of steps.  Be aware of all pets. OTHER HOME SAFETY TIPS  Set the water temperature for 120 F (48.8 C).  Keep emergency numbers on or near the telephone.  Keep smoke detectors on every level of the home and near sleeping areas. Document Released: 08/23/2002 Document Revised: 03/03/2012 Document Reviewed: 11/22/2011 [facility] Patient Information 2015 [place], [place]. This information is not intended to replace advice given to you by your health care provider. Make sure you discuss any questions you have with your health care provider.

## 2014-12-13 LAB — HM DIABETES EYE EXAM

## 2016-06-29 ENCOUNTER — Ambulatory Visit (INDEPENDENT_AMBULATORY_CARE_PROVIDER_SITE_OTHER): Payer: Medicare Other

## 2016-06-29 DIAGNOSIS — Z23 Encounter for immunization: Secondary | ICD-10-CM

## 2016-08-01 ENCOUNTER — Other Ambulatory Visit: Payer: Self-pay | Admitting: Physician Assistant

## 2016-08-16 ENCOUNTER — Encounter: Payer: Self-pay | Admitting: Physician Assistant

## 2016-08-16 ENCOUNTER — Ambulatory Visit (INDEPENDENT_AMBULATORY_CARE_PROVIDER_SITE_OTHER): Payer: Medicare Other | Admitting: Physician Assistant

## 2016-08-16 VITALS — BP 124/59 | HR 80 | Temp 97.8°F | Ht 67.0 in | Wt 216.2 lb

## 2016-08-16 DIAGNOSIS — N9089 Other specified noninflammatory disorders of vulva and perineum: Secondary | ICD-10-CM

## 2016-08-16 DIAGNOSIS — G4730 Sleep apnea, unspecified: Secondary | ICD-10-CM | POA: Diagnosis not present

## 2016-08-16 DIAGNOSIS — E7800 Pure hypercholesterolemia, unspecified: Secondary | ICD-10-CM | POA: Diagnosis not present

## 2016-08-16 DIAGNOSIS — E119 Type 2 diabetes mellitus without complications: Secondary | ICD-10-CM | POA: Diagnosis not present

## 2016-08-16 DIAGNOSIS — Z794 Long term (current) use of insulin: Secondary | ICD-10-CM | POA: Diagnosis not present

## 2016-08-16 DIAGNOSIS — Z6833 Body mass index (BMI) 33.0-33.9, adult: Secondary | ICD-10-CM | POA: Diagnosis not present

## 2016-08-16 DIAGNOSIS — I10 Essential (primary) hypertension: Secondary | ICD-10-CM | POA: Diagnosis not present

## 2016-08-16 LAB — BAYER DCA HB A1C WAIVED: HB A1C (BAYER DCA - WAIVED): 6.9 % (ref <7.0)

## 2016-08-16 MED ORDER — CITALOPRAM HYDROBROMIDE 20 MG PO TABS: 20.0000 mg | ORAL_TABLET | Freq: Every day | ORAL | 3 refills | Status: DC

## 2016-08-16 NOTE — Patient Instructions (Addendum)
Vulvar Cancer Vulvar cancer is a type of cancer that develops in the outside part of the female genitals (vulva). Women most often get vulvar cancer on the inside of their labia. What are the causes? This condition may be caused by:  A virus called human papillomavirus (HPV). This virus is passed from one person to another through sexual activity.  A harmful change (mutation) in your DNA. Mutations in DNA that occur with vulvar cancer are usually not inherited. They can develop due to exposure to cancer-causing chemicals, like tobacco, or for unknown reasons. What increases the risk? You are more likely to develop this condition if:  You have HPV.  You have a skin problem called lichen sclerosus.  You had cervical cancer.  You had a Pap test that was not normal.  You have a weakened immune system.  You have HIV (human immunodeficiency virus).  You have AIDS (acquired immunodeficiency syndrome).  You smoke.  You are over the age of 35. What are the signs or symptoms? Symptoms of this condition include:  A rash, sores, lumps, ulcers, or warts on the vulva.  More of a red or white color to the vulva than usual.  Pain, tenderness, or itchiness in the vulva.  Pelvic pain when urinating or having sex.  Vaginal bleeding. How is this diagnosed? This condition may be diagnosed with tests. After the condition has been diagnosed, you may need to have more tests or checkups. You may be referred for treatment from a specialist in women's reproductive health (gynecologist). How is this treated? Treatment for this condition may include:  Surgery to help remove cancer from your body.  Radiation therapy inside or outside your body. This treatment uses X-rays to kill cancer cells.  Chemotherapy to keep cancer cells from growing. This treatment may be given through an IV tube, as a pill, or in a cream.  Biologic therapy treatment to use the body's own immune system to fight the  cancer. Follow these instructions at home:  Follow your care plan as directed by your health care team.  Take good care of your overall health. A healthy lifestyle may help you recover more quickly.  Follow the diet your health care provider recommends for you, and make healthy food choices to help keep your cancer from coming back.  Do not use any products that contain nicotine or tobacco, such as cigarettes and e-cigarettes. If you need help quitting, ask your health care provider  Get plenty of exercise. Exercising may help to lower stress levels and will give you more energy. Contact a health care provider if:  You have new or unusual symptoms.  You have symptoms that your health care provider has asked you to watch out for.  You feel depressed or sad. Get help right away if:  You feel dizzy, light-headed, and like you could pass out.  You have a fever or chills.  You have bleeding that does not stop. Summary  Vulvar cancer is a type of cancer that develops in the outside part of the female genitals (vulva).  You may have surgery to help remove cancer from your body. Surgery may be combined with other treatments.  Follow your care plan as directed by your health care team.  Take good care of your overall health. A healthy lifestyle may help you recover more quickly. This information is not intended to replace advice given to you by your health care provider. Make sure you discuss any questions you have with your  health care provider. Document Released: 04/30/2016 Document Revised: 04/30/2016 Document Reviewed: 04/30/2016 Elsevier Interactive Patient Education  2017 [place]. Vulvar cancer

## 2016-08-17 LAB — LIPID PANEL
CHOL/HDL RATIO: 2.7 ratio (ref 0.0–4.4)
Cholesterol, Total: 131 mg/dL (ref 100–199)
HDL: 48 mg/dL (ref >39)
LDL CALC: 58 mg/dL (ref 0–99)
TRIGLYCERIDES: 123 mg/dL (ref 0–149)
VLDL CHOLESTEROL CAL: 25 mg/dL (ref 5–40)

## 2016-08-17 LAB — CMP14+EGFR
A/G RATIO: 1.7 (ref 1.2–2.2)
ALT: 17 IU/L (ref 0–32)
AST: 21 IU/L (ref 0–40)
Albumin: 4.2 g/dL (ref 3.5–4.8)
Alkaline Phosphatase: 72 IU/L (ref 39–117)
BUN/Creatinine Ratio: 21 (ref 12–28)
BUN: 19 mg/dL (ref 8–27)
Bilirubin Total: 0.3 mg/dL (ref 0.0–1.2)
CALCIUM: 9.5 mg/dL (ref 8.7–10.3)
CO2: 24 mmol/L (ref 18–29)
CREATININE: 0.92 mg/dL (ref 0.57–1.00)
Chloride: 100 mmol/L (ref 96–106)
GFR, EST AFRICAN AMERICAN: 71 mL/min/{1.73_m2} (ref >59)
GFR, EST NON AFRICAN AMERICAN: 62 mL/min/{1.73_m2} (ref >59)
GLOBULIN, TOTAL: 2.5 g/dL (ref 1.5–4.5)
Glucose: 139 mg/dL — ABNORMAL HIGH (ref 65–99)
POTASSIUM: 4.6 mmol/L (ref 3.5–5.2)
Sodium: 140 mmol/L (ref 134–144)
TOTAL PROTEIN: 6.7 g/dL (ref 6.0–8.5)

## 2016-08-17 LAB — CBC WITH DIFFERENTIAL/PLATELET
BASOS: 1 %
Basophils Absolute: 0.1 10*3/uL (ref 0.0–0.2)
EOS (ABSOLUTE): 0.1 10*3/uL (ref 0.0–0.4)
EOS: 1 %
HEMATOCRIT: 38.2 % (ref 34.0–46.6)
Hemoglobin: 12.8 g/dL (ref 11.1–15.9)
IMMATURE GRANS (ABS): 0 10*3/uL (ref 0.0–0.1)
IMMATURE GRANULOCYTES: 0 %
LYMPHS: 25 %
Lymphocytes Absolute: 2.3 10*3/uL (ref 0.7–3.1)
MCH: 29.3 pg (ref 26.6–33.0)
MCHC: 33.5 g/dL (ref 31.5–35.7)
MCV: 87 fL (ref 79–97)
Monocytes Absolute: 0.7 10*3/uL (ref 0.1–0.9)
Monocytes: 7 %
NEUTROS PCT: 66 %
Neutrophils Absolute: 5.9 10*3/uL (ref 1.4–7.0)
PLATELETS: 381 10*3/uL — AB (ref 150–379)
RBC: 4.37 x10E6/uL (ref 3.77–5.28)
RDW: 14.6 % (ref 12.3–15.4)
WBC: 9 10*3/uL (ref 3.4–10.8)

## 2016-08-17 LAB — MICROALBUMIN / CREATININE URINE RATIO
CREATININE, UR: 186.7 mg/dL
Microalb/Creat Ratio: 6.6 mg/g creat (ref 0.0–30.0)
Microalbumin, Urine: 12.3 ug/mL

## 2016-08-18 DIAGNOSIS — D071 Carcinoma in situ of vulva: Secondary | ICD-10-CM | POA: Insufficient documentation

## 2016-08-18 NOTE — Progress Notes (Signed)
BP (!) 124/59   Pulse 80   Temp 97.8 F (36.6 C) (Oral)   Ht '5\' 7"'$  (1.702 m)   Wt 216 lb 3.2 oz (98.1 kg)   BMI 33.86 kg/m    Subjective:    Patient ID: Adrienne Cook, female    DOB: [birth year], 75 y.o.   MRN: [id number]  HPI: Adrienne Cook is a 74 y.o. female presenting on 08/16/2016 for Diabetes  This patient comes in for periodic recheck on medications and conditions. All medications are reviewed today. There are no reports of any problems with the medications. All of the medical conditions are reviewed and updated.  Lab work is reviewed and will be ordered as medically necessary.   New: Negative or as above. She is a gynecology examination. She is postmenopausal. This is a gynecology evaluation for this   Past Medical History:  Diagnosis Date  . 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 Chest pain    a. normal cath in 2004;  b. nl stress echo 2011;  c. 02/2012 Cath: nl cors, EF 65%.  . DM (diabetes mellitus) ([place])    insulin dependent  . HTN (hypertension)   . Hypercholesterolemia   . Irregular heartbeat    a. normal event monitor  . Obesity   . PONV (postoperative nausea and vomiting)   . Seasonal allergies   . Sleep apnea    does not tolerate CPAP   Relevant past medical, surgical, family and social history reviewed and updated as indicated. Interim medical history since our last visit reviewed. Allergies and medications reviewed and updated. DATA REVIEWED: CHART IN EPIC  Social History   Social History  . Marital status: Married    Spouse name: N/A  . Number of children: N/A  . Years of education: N/A   Occupational History  . Not on file.   Social History Main Topics  . Smoking status: Never Smoker  . Smokeless tobacco: Never Used     Comment: tobacco use - no  . Alcohol use No  . Drug use: No  . Sexual activity: Not Currently   Other Topics Concern  . Not on file   Social History Narrative   Married; full time.     Past Surgical History:  Procedure  Laterality Date  . APPENDECTOMY    . BREAST CYST EXCISION     benign  . CARPAL TUNNEL RELEASE    . CHOLECYSTECTOMY    . COLONOSCOPY  02/13/2012  . EXAMINATION UNDER ANESTHESIA     and sphincterotomy  . HAMMER TOE SURGERY     bilateral; 5th toes  . LEFT HEART CATH  02/28/2012   Procedure: LEFT HEART CATH;  Surgeon: Hillary Bow, MD;  Location: [facility] CATH LAB;  Service: Cardiovascular;;  . LEFT HEART CATHETERIZATION WITH CORONARY ANGIOGRAM N/A 02/28/2012   Procedure: LEFT HEART CATHETERIZATION WITH CORONARY ANGIOGRAM;  Surgeon: Hillary Bow, MD;  Location: [facility] CATH LAB;  Service: Cardiovascular;  Laterality: N/A;  . VAGINAL HYSTERECTOMY  1978    Family History  Problem Relation Age of Onset  . Heart failure Mother     CHF   . Colon cancer Neg Hx   . Stomach cancer Neg Hx     Review of Systems  Constitutional: Negative.  Negative for activity change, fatigue and fever.  HENT: Negative.   Eyes: Negative.   Respiratory: Negative.  Negative for cough.   Cardiovascular: Negative.  Negative for chest pain.  Gastrointestinal: Negative.  Negative for abdominal pain.  Endocrine: Negative.   Genitourinary: Negative.  Negative for dysuria.  Musculoskeletal: Negative.   Skin: Negative.   Neurological: Negative.       Medication List       Accurate as of 08/16/16 11:59 PM. Always use your most recent med list.          aspirin 81 MG EC tablet Take 1 tablet (81 mg total) by mouth daily.   B-D INS SYRINGE 0.5CC/30GX1/2" 30G X 1/2" 0.5 ML Misc Generic drug:  Insulin Syringe-Needle U-100 USE 1 NEEDLE UP TO 5 TIMES DAILY   citalopram 20 MG tablet Commonly known as:  CELEXA Take 1 tablet (20 mg total) by mouth daily.   diltiazem 240 MG 24 hr capsule Commonly known as:  CARDIZEM CD Take 240 mg by mouth daily.   gabapentin 400 MG capsule Commonly known as:  NEURONTIN Take 400 mg by mouth at bedtime. Take 1-3 capsules at bedtime   HUMULIN N [place] Inject 40 Units into the  skin 2 (two) times daily.   HUMULIN R 250 units/2.40m (100 units/mL) injection Generic drug:  insulin regular Inject 35 Units into the skin. 10-20 units before meals   ibuprofen 200 MG tablet Commonly known as:  ADVIL,MOTRIN Take 200 mg by mouth every 6 (six) hours as needed.   losartan-hydrochlorothiazide 100-25 MG tablet Commonly known as:  HYZAAR Take 1 tablet by mouth daily.   metFORMIN 1000 MG tablet Commonly known as:  GLUCOPHAGE Take 1 tablet (1,000 mg total) by mouth 2 (two) times daily with a meal.   simvastatin 40 MG tablet Commonly known as:  ZOCOR Take 40 mg by mouth daily.   Vitamin D3 5000 units Tabs Take 5,000 Units by mouth daily.          Objective:    BP (!) 124/59   Pulse 80   Temp 97.8 F (36.6 C) (Oral)   Ht '5\' 7"'$  (1.702 m)   Wt 216 lb 3.2 oz (98.1 kg)   BMI 33.86 kg/m   No Known Allergies  Wt Readings from Last 3 Encounters:  08/16/16 216 lb 3.2 oz (98.1 kg)  09/10/13 205 lb (93 kg)  09/14/12 214 lb (97.1 kg)    Physical Exam  Constitutional: She is oriented to person, place, and time. She appears well-developed and well-nourished.  HENT:  Head: Normocephalic and atraumatic.  Eyes: Conjunctivae and EOM are normal. Pupils are equal, round, and reactive to light.  Neck: Normal range of motion. Neck supple.  Cardiovascular: Normal rate, regular rhythm, normal heart sounds and intact distal pulses.   Pulmonary/Chest: Effort normal and breath sounds normal.  Abdominal: Soft. Bowel sounds are normal.  Neurological: She is alert and oriented to person, place, and time. She has normal reflexes.  Skin: Skin is warm and dry. No rash noted.  Psychiatric: She has a normal mood and affect. Her behavior is normal. Judgment and thought content normal.   Diabetic Foot Exam - Simple   Simple Foot Form Diabetic Foot exam was performed with the following findings:  Yes 08/15/2016 12:00 PM  Visual Inspection No deformities, no ulcerations, no other  skin breakdown bilaterally:  Yes Sensation Testing Intact to touch and monofilament testing bilaterally:  Yes Pulse Check Posterior Tibialis and Dorsalis pulse intact bilaterally:  Yes Comments      Results for orders placed or performed in visit on 08/16/16  Bayer DCA Hb A1c Waived  Result Value Ref Range   Bayer DCA Hb A1c  Waived 6.9 <7.0 %  CBC with Differential/Platelet  Result Value Ref Range   WBC 9.0 3.4 - 10.8 x10E3/uL   RBC 4.37 3.77 - 5.28 x10E6/uL   Hemoglobin 12.8 11.1 - 15.9 g/dL   Hematocrit 34.1 96.2 - 46.6 %   MCV 87 79 - 97 fL   MCH 29.3 26.6 - 33.0 pg   MCHC 33.5 31.5 - 35.7 g/dL   RDW 22.9 79.8 - 92.1 %   Platelets 381 (H) 150 - 379 x10E3/uL   Neutrophils 66 Not Estab. %   Lymphs 25 Not Estab. %   Monocytes 7 Not Estab. %   Eos 1 Not Estab. %   Basos 1 Not Estab. %   Neutrophils Absolute 5.9 1.4 - 7.0 x10E3/uL   Lymphocytes Absolute 2.3 0.7 - 3.1 x10E3/uL   Monocytes Absolute 0.7 0.1 - 0.9 x10E3/uL   EOS (ABSOLUTE) 0.1 0.0 - 0.4 x10E3/uL   Basophils Absolute 0.1 0.0 - 0.2 x10E3/uL   Immature Granulocytes 0 Not Estab. %   Immature Grans (Abs) 0.0 0.0 - 0.1 x10E3/uL  CMP14+EGFR  Result Value Ref Range   Glucose 139 (H) 65 - 99 mg/dL   BUN 19 8 - 27 mg/dL   Creatinine, Ser 1.94 0.57 - 1.00 mg/dL   GFR calc non Af Amer 62 >59 mL/min/1.73   GFR calc Af Amer 71 >59 mL/min/1.73   BUN/Creatinine Ratio 21 12 - 28   Sodium 140 134 - 144 mmol/L   Potassium 4.6 3.5 - 5.2 mmol/L   Chloride 100 96 - 106 mmol/L   CO2 24 18 - 29 mmol/L   Calcium 9.5 8.7 - 10.3 mg/dL   Total Protein 6.7 6.0 - 8.5 g/dL   Albumin 4.2 3.5 - 4.8 g/dL   Globulin, Total 2.5 1.5 - 4.5 g/dL   Albumin/Globulin Ratio 1.7 1.2 - 2.2   Bilirubin Total 0.3 0.0 - 1.2 mg/dL   Alkaline Phosphatase 72 39 - 117 IU/L   AST 21 0 - 40 IU/L   ALT 17 0 - 32 IU/L  Lipid panel  Result Value Ref Range   Cholesterol, Total 131 100 - 199 mg/dL   Triglycerides 174 0 - 149 mg/dL   HDL 48 >08 mg/dL    VLDL Cholesterol Cal 25 5 - 40 mg/dL   LDL Calculated 58 0 - 99 mg/dL   Chol/HDL Ratio 2.7 0.0 - 4.4 ratio units  Microalbumin / creatinine urine ratio  Result Value Ref Range   Creatinine, Urine 186.7 Not Estab. mg/dL   Microalbum.,U,Random 12.3 Not Estab. ug/mL   Microalb/Creat Ratio 6.6 0.0 - 30.0 mg/g creat      Assessment & Plan:   1. HYPERCHOLESTEROLEMIA - Lipid panel  2. Essential hypertension - CBC with Differential/Platelet - CMP14+EGFR  3. Type 2 diabetes mellitus without complication, with long-term current use of insulin (HCC) - Bayer DCA Hb A1c Waived - Microalbumin / creatinine urine ratio  4. Sleep apnea, unspecified type  5. Body mass index 33.0-33.9, adult  6. Vulvar lesion Gynecology evaluation Referral will be made   Continue all other maintenance medications as listed above.  Follow up plan: Return in about 3 months (around 11/14/2016).  Orders Placed This Encounter  Procedures  . Bayer DCA Hb A1c Waived  . CBC with Differential/Platelet  . CMP14+EGFR  . Lipid panel  . Microalbumin / creatinine urine ratio    Educational handout given for vulvar cancer  Remus Loffler PA-C [facility] [address]  [place], [place] [address] [phone]   08/18/2016, 10:45 PM

## 2016-08-27 ENCOUNTER — Telehealth: Payer: Self-pay | Admitting: Physician Assistant

## 2016-08-27 DIAGNOSIS — N9089 Other specified noninflammatory disorders of vulva and perineum: Secondary | ICD-10-CM

## 2016-08-28 NOTE — Telephone Encounter (Signed)
[email or web address]

## 2016-08-28 NOTE — Telephone Encounter (Signed)
Built and routed to pool

## 2016-08-28 NOTE — Telephone Encounter (Signed)
No referral placed for this

## 2016-09-03 NOTE — Telephone Encounter (Signed)
Patient is aware of appointment on 09/05/2016

## 2016-09-05 ENCOUNTER — Encounter: Payer: Self-pay | Admitting: Obstetrics and Gynecology

## 2016-09-05 ENCOUNTER — Ambulatory Visit (INDEPENDENT_AMBULATORY_CARE_PROVIDER_SITE_OTHER): Payer: Medicare Other | Admitting: Obstetrics and Gynecology

## 2016-09-05 VITALS — BP 160/62 | HR 83 | Wt 217.6 lb

## 2016-09-05 DIAGNOSIS — D280 Benign neoplasm of vulva: Secondary | ICD-10-CM

## 2016-09-05 DIAGNOSIS — N952 Postmenopausal atrophic vaginitis: Secondary | ICD-10-CM

## 2016-09-05 MED ORDER — ESTRADIOL 0.1 MG/GM VA CREA: 0.2500 | TOPICAL_CREAM | VAGINAL | 3 refills | Status: DC

## 2016-09-05 NOTE — Progress Notes (Addendum)
Patient ID: Adrienne Cook, female   DOB: Jan 04, 1941, 75 y.o.   MRN: [id number]    [place] [facility] Visit  @DATE @            Patient name: Adrienne Cook MRN [id number]  Date of birth: Sep 05, 1941  CC & HPI:   Chief Complaint  Patient presents with  . vulva lesion     KESI BRANK is a 75 y.o. female presenting today referred from PCP for evaluation of a vulvar lesion. Pt states she noticed the irritation on the left side a month or two ago. She denies bleeding from the area, but states the irritation is constant. Pt states the irritation is exacerbated with any contact. No h/o breast cancer, strokes, MI.   ROS:  ROS +left-sided vulvar lesion with irritation, no bleeding   Pertinent History Reviewed:   Reviewed: Significant for vaginal hysterectomy Medical         Past Medical History:  Diagnosis Date  . 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 Chest pain    a. normal cath in 2004;  b. nl stress echo 2011;  c. 02/2012 Cath: nl cors, EF 65%.  . DM (diabetes mellitus) ([place])    insulin dependent  . HTN (hypertension)   . Hypercholesterolemia   . Irregular heartbeat    a. normal event monitor  . Obesity   . PONV (postoperative nausea and vomiting)   . Seasonal allergies   . Sleep apnea    does not tolerate CPAP                              Surgical Hx:    Past Surgical History:  Procedure Laterality Date  . APPENDECTOMY    . BREAST CYST EXCISION     benign  . CARPAL TUNNEL RELEASE    . CHOLECYSTECTOMY    . COLONOSCOPY  02/13/2012  . EXAMINATION UNDER ANESTHESIA     and sphincterotomy  . HAMMER TOE SURGERY     bilateral; 5th toes  . LEFT HEART CATH  02/28/2012   Procedure: LEFT HEART CATH;  Surgeon: Hillary Bow, MD;  Location[facility] CATH LAB;  Service: Cardiovascular;;  . LEFT HEART CATHETERIZATION WITH CORONARY ANGIOGRAM N/A 02/28/2012   Procedure: LEFT HEART CATHETERIZATION WITH CORONARY ANGIOGRAM;  Surgeon: Hillary Bow, MD;  Location: [facility] CATH LAB;  Service:  Cardiovascular;  Laterality: N/A;  . VAGINAL HYSTERECTOMY  1978   Medications: Reviewed & Updated - see associated section                       Current Outpatient Prescriptions:  .  aspirin 81 MG EC tablet, Take 1 tablet (81 mg total) by mouth daily., Disp: , Rfl:  .  B-D INS SYRINGE 0.5CC/30GX1/2" 30G X 1/2" 0.5 ML MISC, USE 1 NEEDLE UP TO 5 TIMES DAILY, Disp: 300 each, Rfl: 7 .  Cholecalciferol (VITAMIN D3) 5000 UNITS TABS, Take 5,000 Units by mouth daily., Disp: , Rfl:  .  citalopram (CELEXA) 20 MG tablet, Take 1 tablet (20 mg total) by mouth daily., Disp: 90 tablet, Rfl: 3 .  diltiazem (CARDIZEM CD) 240 MG 24 hr capsule, Take 240 mg by mouth daily., Disp: , Rfl:  .  gabapentin (NEURONTIN) 400 MG capsule, Take 400 mg by mouth at bedtime. Take 1-3 capsules at bedtime, Disp: , Rfl:  .  ibuprofen (ADVIL,MOTRIN) 200 MG tablet, Take 200  mg by mouth every 6 (six) hours as needed., Disp: , Rfl:  .  Insulin [place] Human (HUMULIN N [place]), Inject 40 Units into the skin 2 (two) times daily. , Disp: , Rfl:  .  insulin regular (HUMULIN R) 100 units/mL injection, Inject 35 Units into the skin. 10-20 units before meals, Disp: , Rfl:  .  losartan-hydrochlorothiazide (HYZAAR) 100-25 MG per tablet, Take 1 tablet by mouth daily.  , Disp: , Rfl:  .  metFORMIN (GLUCOPHAGE) 1000 MG tablet, Take 1 tablet (1,000 mg total) by mouth 2 (two) times daily with a meal., Disp: , Rfl:  .  simvastatin (ZOCOR) 40 MG tablet, Take 40 mg by mouth daily., Disp: , Rfl:    Social History: Reviewed -  reports that she has never smoked. She has never used smokeless tobacco.  Objective Findings:  Vitals: Blood pressure (!) 160/62, pulse 83, weight 217 lb 9.6 oz (98.7 kg).  Physical Examination: General appearance - alert, well appearing, and in no distress Mental status - alert, oriented to person, place, and time Abdomen - soft, nontender, nondistended, no masses or organomegaly Pelvic -  VULVA: Outer lips appear normal.  Atrophic tissues replacing entire left labia majora. The right side has some agglutination. Erythema with thinning of the tissues.    Assessment & Plan:   A:  1. 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 Will rx Estrace to be used for 4 weeks , hs x 2 wk then 2-3x/wk 2. F/u in 4 weeks for recheck and vaginal exam  3. Consider steroid cream after Estrace tx     By signing my name below, I, Hansel Feinstein, attest that this documentation has been prepared under the direction and in the presence of Jonnie Kind, MD. Electronically Signed: Hansel Feinstein, ED Scribe. 09/05/16. 11:31 AM.  I personally performed the services described in this documentation, which was SCRIBED in my presence. The recorded information has been reviewed and considered accurate. It has been edited as necessary during review. Jonnie Kind, MD

## 2016-09-05 NOTE — Patient Instructions (Signed)
Hysteroscopy Hysteroscopy Hysteroscopy is a procedure used for looking inside the womb (uterus). It may be done for various reasons, including:  To evaluate abnormal bleeding, fibroid (benign, noncancerous) tumors, polyps, scar tissue (adhesions), and possibly cancer of the uterus.  To look for lumps (tumors) and other uterine growths.  To look for causes of why a woman cannot get pregnant (infertility), causes of recurrent loss of pregnancy (miscarriages), or a lost intrauterine device (IUD).  To perform a sterilization by blocking the fallopian tubes from inside the uterus. In this procedure, a thin, flexible tube with a tiny light and camera on the end of it (hysteroscope) is used to look inside the uterus. A hysteroscopy should be done right after a menstrual period to be sure you are not pregnant. LET [facility] CARE PROVIDER KNOW ABOUT:   Any allergies you have.  All medicines you are taking, including vitamins, herbs, eye drops, creams, and over-the-counter medicines.  Previous problems you or members of your family have had with the use of anesthetics.  Any blood disorders you have.  Previous surgeries you have had.  Medical conditions you have. RISKS AND COMPLICATIONS  Generally, this is a safe procedure. However, as with any procedure, complications can occur. Possible complications include:  Putting a hole in the uterus.  Excessive bleeding.  Infection.  Damage to the cervix.  Injury to other organs.  Allergic reaction to medicines.  Too much fluid used in the uterus for the procedure. BEFORE THE PROCEDURE   Ask your health care provider about changing or stopping any regular medicines.  Do not take aspirin or blood thinners for 1 week before the procedure, or as directed by your health care provider. These can cause bleeding.  If you smoke, do not smoke for 2 weeks before the procedure.  In some cases, a medicine is placed in the cervix the day before the  procedure. This medicine makes the cervix have a larger opening (dilate). This makes it easier for the instrument to be inserted into the uterus during the procedure.  Do not eat or drink anything for at least 8 hours before the surgery.  Arrange for someone to take you home after the procedure. PROCEDURE   You may be given a medicine to relax you (sedative). You may also be given one of the following:  A medicine that numbs the area around the cervix (local anesthetic).  A medicine that makes you sleep through the procedure (general anesthetic).  The hysteroscope is inserted through the vagina into the uterus. The camera on the hysteroscope sends a picture to a TV screen. This gives the surgeon a good view inside the uterus.  During the procedure, air or a liquid is put into the uterus, which allows the surgeon to see better.  Sometimes, tissue is gently scraped from inside the uterus. These tissue samples are sent to a lab for testing. AFTER THE PROCEDURE   If you had a general anesthetic, you may be groggy for a couple hours after the procedure.  If you had a local anesthetic, you will be able to go home as soon as you are stable and feel ready.  You may have some cramping. This normally lasts for a couple days.  You may have bleeding, which varies from light spotting for a few days to menstrual-like bleeding for 3-7 days. This is normal.  If your test results are not back during the visit, make an appointment with your health care provider to find out  the results. This information is not intended to replace advice given to you by your health care provider. Make sure you discuss any questions you have with your health care provider. Document Released: 12/09/2000 Document Revised: 06/23/2013 Document Reviewed: 04/01/2013 Elsevier Interactive Patient Education  2017 [employer].

## 2016-10-23 ENCOUNTER — Ambulatory Visit: Payer: Medicare Other | Admitting: Obstetrics and Gynecology

## 2016-11-01 ENCOUNTER — Telehealth: Payer: Self-pay | Admitting: Physician Assistant

## 2016-11-01 MED ORDER — GABAPENTIN 400 MG PO CAPS: 400.0000 mg | ORAL_CAPSULE | Freq: Every day | ORAL | 11 refills | Status: DC

## 2016-11-01 NOTE — Telephone Encounter (Signed)
LMOVM that refill has been sent to pharmacy

## 2016-11-01 NOTE — Telephone Encounter (Signed)
Please advise on refill.

## 2016-11-06 ENCOUNTER — Ambulatory Visit (INDEPENDENT_AMBULATORY_CARE_PROVIDER_SITE_OTHER): Payer: Medicare Other | Admitting: Obstetrics and Gynecology

## 2016-11-06 ENCOUNTER — Encounter: Payer: Self-pay | Admitting: Obstetrics and Gynecology

## 2016-11-06 VITALS — BP 136/64 | HR 72 | Ht 67.0 in | Wt 215.2 lb

## 2016-11-06 DIAGNOSIS — N952 Postmenopausal atrophic vaginitis: Secondary | ICD-10-CM

## 2016-11-06 DIAGNOSIS — D071 Carcinoma in situ of vulva: Secondary | ICD-10-CM

## 2016-11-06 NOTE — Progress Notes (Signed)
[facility] Visit  @DATE @            Patient name: Adrienne Cook MRN [id number]  Date of birth: 23-Apr-1941  CC & HPI:  Adrienne Cook is a 76 y.o. 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 She was last seen in this office on 09/05/16 and was prescribed Estrace. She returns stating the Estrace was not helpful.   ROS:  ROS +vulvar tissue dystrophy   Pertinent History Reviewed:   Reviewed: Significant for  Medical         Past Medical History:  Diagnosis Date  . 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 Chest pain    a. normal cath in 2004;  b. nl stress echo 2011;  c. 02/2012 Cath: nl cors, EF 65%.  . DM (diabetes mellitus) ([place])    insulin dependent  . HTN (hypertension)   . Hypercholesterolemia   . Irregular heartbeat    a. normal event monitor  . Obesity   . PONV (postoperative nausea and vomiting)   . Seasonal allergies   . Sleep apnea    does not tolerate CPAP                              Surgical Hx:    Past Surgical History:  Procedure Laterality Date  . APPENDECTOMY    . BREAST CYST EXCISION     benign  . CARPAL TUNNEL RELEASE    . CHOLECYSTECTOMY    . COLONOSCOPY  02/13/2012  . EXAMINATION UNDER ANESTHESIA     and sphincterotomy  . HAMMER TOE SURGERY     bilateral; 5th toes  . LEFT HEART CATH  02/28/2012   Procedure: LEFT HEART CATH;  Surgeon: Hillary Bow, MD;  Location: [facility] CATH LAB;  Service: Cardiovascular;;  . LEFT HEART CATHETERIZATION WITH CORONARY ANGIOGRAM N/A 02/28/2012   Procedure: LEFT HEART CATHETERIZATION WITH CORONARY ANGIOGRAM;  Surgeon: Hillary Bow, MD;  Location: [facility] CATH LAB;  Service: Cardiovascular;  Laterality: N/A;  . VAGINAL HYSTERECTOMY  1978   Medications: Reviewed & Updated - see associated section                       Current Outpatient Prescriptions:  .  aspirin 81 MG EC tablet, Take 1 tablet (81 mg total) by mouth daily., Disp: , Rfl:  .  B-D INS  SYRINGE 0.5CC/30GX1/2" 30G X 1/2" 0.5 ML MISC, USE 1 NEEDLE UP TO 5 TIMES DAILY, Disp: 300 each, Rfl: 7 .  Cholecalciferol (VITAMIN D3) 5000 UNITS TABS, Take 5,000 Units by mouth daily., Disp: , Rfl:  .  citalopram (CELEXA) 20 MG tablet, Take 1 tablet (20 mg total) by mouth daily., Disp: 90 tablet, Rfl: 3 .  diltiazem (CARDIZEM CD) 240 MG 24 hr capsule, Take 240 mg by mouth daily., Disp: , Rfl:  .  estradiol (ESTRACE VAGINAL) 0.1 MG/GM vaginal cream, Place [id number] Applicatorfuls vaginally 3 (three) times a week. This is 123XX123 g/ application, Disp: 123XX123 g, Rfl: 3 .  gabapentin (NEURONTIN) 400 MG capsule, Take 1 capsule (400 mg total) by mouth at bedtime. Take 1-3 capsules at bedtime, Disp: 90 capsule, Rfl: 11 .  ibuprofen (ADVIL,MOTRIN) 200 MG tablet, Take 200 mg by mouth every 6 (six) hours as needed., Disp: , Rfl:  .  Insulin Isophane Human (HUMULIN N [place]), Inject  40 Units into the skin 2 (two) times daily. , Disp: , Rfl:  .  insulin regular (HUMULIN R) 100 units/mL injection, Inject 35 Units into the skin. 10-20 units before meals, Disp: , Rfl:  .  losartan-hydrochlorothiazide (HYZAAR) 100-25 MG per tablet, Take 1 tablet by mouth daily.  , Disp: , Rfl:  .  metFORMIN (GLUCOPHAGE) 1000 MG tablet, Take 1 tablet (1,000 mg total) by mouth 2 (two) times daily with a meal., Disp: , Rfl:  .  simvastatin (ZOCOR) 40 MG tablet, Take 40 mg by mouth daily., Disp: , Rfl:    Social History: Reviewed -  reports that she has never smoked. She has never used smokeless tobacco.  Objective Findings:  Vitals: Blood pressure 136/64, pulse 72, height 5\' 7"  (1.702 m), weight 215 lb 3.2 oz (97.6 kg).  Physical Examination: General appearance - alert, well appearing, and in no distress Mental status - normal mood, behavior, speech, dress, motor activity, and thought processes Pelvic -  VULVA: vulvar dystrophy with agglutination noted on right labia minora  VAGINA: normal appearing vagina with normal color and discharge,  no lesions   Assessment & Plan:   A:  1. 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 Use testosterone propionate 2% cream BID until next visit.  2. Return in 1 month    By signing my name below, I, [name], attest that this documentation has been prepared under the direction and in the presence of Jonnie Kind, MD. Electronically Signed: [name], Education administrator. 11/06/16. 12:27 PM.  I personally performed the services described in this documentation, which was SCRIBED in my presence. The recorded information has been reviewed and considered accurate. It has been edited as necessary during review. Jonnie Kind, MD

## 2016-11-11 ENCOUNTER — Other Ambulatory Visit: Payer: Self-pay | Admitting: *Deleted

## 2016-11-11 MED ORDER — LOSARTAN POTASSIUM-HCTZ 100-25 MG PO TABS: 1.0000 | ORAL_TABLET | Freq: Every day | ORAL | 0 refills | Status: DC

## 2016-11-14 ENCOUNTER — Ambulatory Visit (INDEPENDENT_AMBULATORY_CARE_PROVIDER_SITE_OTHER): Payer: Medicare Other | Admitting: Physician Assistant

## 2016-11-14 ENCOUNTER — Encounter: Payer: Self-pay | Admitting: Physician Assistant

## 2016-11-14 VITALS — BP 137/78 | HR 87 | Temp 97.4°F | Ht 67.0 in | Wt 217.4 lb

## 2016-11-14 DIAGNOSIS — Z794 Long term (current) use of insulin: Secondary | ICD-10-CM | POA: Diagnosis not present

## 2016-11-14 DIAGNOSIS — E119 Type 2 diabetes mellitus without complications: Secondary | ICD-10-CM

## 2016-11-14 DIAGNOSIS — E7800 Pure hypercholesterolemia, unspecified: Secondary | ICD-10-CM | POA: Diagnosis not present

## 2016-11-14 DIAGNOSIS — I10 Essential (primary) hypertension: Secondary | ICD-10-CM | POA: Diagnosis not present

## 2016-11-14 DIAGNOSIS — N289 Disorder of kidney and ureter, unspecified: Secondary | ICD-10-CM

## 2016-11-14 LAB — LIPID PANEL
CHOLESTEROL TOTAL: 133 mg/dL (ref 100–199)
Chol/HDL Ratio: 3.2 ratio units (ref 0.0–4.4)
HDL: 42 mg/dL (ref >39)
LDL Calculated: 61 mg/dL (ref 0–99)
Triglycerides: 151 mg/dL — ABNORMAL HIGH (ref 0–149)
VLDL Cholesterol Cal: 30 mg/dL (ref 5–40)

## 2016-11-14 LAB — CMP14+EGFR
ALK PHOS: 75 IU/L (ref 39–117)
ALT: 20 IU/L (ref 0–32)
AST: 25 IU/L (ref 0–40)
Albumin/Globulin Ratio: 1.7 (ref 1.2–2.2)
Albumin: 4.1 g/dL (ref 3.5–4.8)
BILIRUBIN TOTAL: 0.4 mg/dL (ref 0.0–1.2)
BUN/Creatinine Ratio: 16 (ref 12–28)
BUN: 14 mg/dL (ref 8–27)
CHLORIDE: 100 mmol/L (ref 96–106)
CO2: 23 mmol/L (ref 18–29)
Calcium: 9.3 mg/dL (ref 8.7–10.3)
Creatinine, Ser: 0.88 mg/dL (ref 0.57–1.00)
GFR calc Af Amer: 75 mL/min/{1.73_m2} (ref >59)
GFR calc non Af Amer: 65 mL/min/{1.73_m2} (ref >59)
GLUCOSE: 129 mg/dL — AB (ref 65–99)
Globulin, Total: 2.4 g/dL (ref 1.5–4.5)
Potassium: 4.5 mmol/L (ref 3.5–5.2)
Sodium: 140 mmol/L (ref 134–144)
Total Protein: 6.5 g/dL (ref 6.0–8.5)

## 2016-11-14 LAB — BAYER DCA HB A1C WAIVED: HB A1C: 7.1 % — AB (ref <7.0)

## 2016-11-14 MED ORDER — SIMVASTATIN 40 MG PO TABS: 40.0000 mg | ORAL_TABLET | Freq: Every day | ORAL | 3 refills | Status: DC

## 2016-11-14 MED ORDER — LORATADINE 10 MG PO TABS: 10.0000 mg | ORAL_TABLET | Freq: Every day | ORAL | 3 refills | Status: DC

## 2016-11-14 MED ORDER — INSULIN REGULAR HUMAN 100 UNIT/ML IJ SOLN: 35.0000 [IU] | Freq: Three times a day (TID) | INTRAMUSCULAR | 3 refills | Status: DC

## 2016-11-14 MED ORDER — METFORMIN HCL 1000 MG PO TABS: 1000.0000 mg | ORAL_TABLET | Freq: Two times a day (BID) | ORAL | 3 refills | Status: DC

## 2016-11-14 MED ORDER — LOSARTAN POTASSIUM-HCTZ 100-25 MG PO TABS: 1.0000 | ORAL_TABLET | Freq: Every day | ORAL | 3 refills | Status: DC

## 2016-11-14 NOTE — Patient Instructions (Signed)
Allergic Rhinitis Allergic rhinitis is when the mucous membranes in the nose respond to allergens. Allergens are particles in the air that cause your body to have an allergic reaction. This causes you to release allergic antibodies. Through a chain of events, these eventually cause you to release histamine into the blood stream. Although meant to protect the body, it is this release of histamine that causes your discomfort, such as frequent sneezing, congestion, and an itchy, runny nose. What are the causes? Seasonal allergic rhinitis (hay fever) is caused by pollen allergens that may come from grasses, trees, and weeds. Year-round allergic rhinitis (perennial allergic rhinitis) is caused by allergens such as house dust mites, pet dander, and mold spores. What are the signs or symptoms?  Nasal stuffiness (congestion).  Itchy, runny nose with sneezing and tearing of the eyes. How is this diagnosed? Your health care provider can help you determine the allergen or allergens that trigger your symptoms. If you and your health care provider are unable to determine the allergen, skin or blood testing may be used. Your health care provider will diagnose your condition after taking your health history and performing a physical exam. Your health care provider may assess you for other related conditions, such as asthma, pink eye, or an ear infection. How is this treated? Allergic rhinitis does not have a cure, but it can be controlled by:  Medicines that block allergy symptoms. These may include allergy shots, nasal sprays, and oral antihistamines.  Avoiding the allergen. Hay fever may often be treated with antihistamines in pill or nasal spray forms. Antihistamines block the effects of histamine. There are over-the-counter medicines that may help with nasal congestion and swelling around the eyes. Check with your health care provider before taking or giving this medicine. If avoiding the allergen or the  medicine prescribed do not work, there are many new medicines your health care provider can prescribe. Stronger medicine may be used if initial measures are ineffective. Desensitizing injections can be used if medicine and avoidance does not work. Desensitization is when a patient is given ongoing shots until the body becomes less sensitive to the allergen. Make sure you follow up with your health care provider if problems continue. Follow these instructions at home: It is not possible to completely avoid allergens, but you can reduce your symptoms by taking steps to limit your exposure to them. It helps to know exactly what you are allergic to so that you can avoid your specific triggers. Contact a health care provider if:  You have a fever.  You develop a cough that does not stop easily (persistent).  You have shortness of breath.  You start wheezing.  Symptoms interfere with normal daily activities. This information is not intended to replace advice given to you by your health care provider. Make sure you discuss any questions you have with your health care provider. Document Released: 05/28/2001 Document Revised: 05/03/2016 Document Reviewed: 05/10/2013 Elsevier Interactive Patient Education  2017 Elsevier Inc.  

## 2016-11-15 ENCOUNTER — Ambulatory Visit: Payer: Medicare Other | Admitting: Physician Assistant

## 2016-11-15 NOTE — Progress Notes (Signed)
BP 137/78   Pulse 87   Temp 97.4 F (36.3 C) (Oral)   Ht _0  (1.702 m)   Wt 217 lb 6.4 oz (98.6 kg)   BMI 34.05 kg/m    Subjective:    Patient ID: Adrienne Cook, female    DOB: [birth year], 76 y.o.   MRN: [id number]  HPI: Adrienne Cook is a 76 y.o. female presenting on 11/14/2016 for Diabetes and Hypertension  This patient comes in for periodic recheck on medications and conditions including Hypertension, diabetes with long-term complication, renal insufficiency mild, hypercholesterolemia.   All medications are reviewed today. There are no reports of any problems with the medications. All of the medical conditions are reviewed and updated.  Lab work is reviewed and will be ordered as medically necessary. There are no new problems reported with today's visit.   Relevant past medical, surgical, family and social history reviewed and updated as indicated. Allergies and medications reviewed and updated.  Past Medical History:  Diagnosis Date  . 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 Chest pain    a. normal cath in 2004;  b. nl stress echo 2011;  c. 02/2012 Cath: nl cors, EF 65%.  . DM (diabetes mellitus) ([place])    insulin dependent  . HTN (hypertension)   . Hypercholesterolemia   . Irregular heartbeat    a. normal event monitor  . Obesity   . PONV (postoperative nausea and vomiting)   . Seasonal allergies   . Sleep apnea    does not tolerate CPAP    Past Surgical History:  Procedure Laterality Date  . APPENDECTOMY    . BREAST CYST EXCISION     benign  . CARPAL TUNNEL RELEASE    . CHOLECYSTECTOMY    . COLONOSCOPY  02/13/2012  . EXAMINATION UNDER ANESTHESIA     and sphincterotomy  . HAMMER TOE SURGERY     bilateral; 5th toes  . LEFT HEART CATH  02/28/2012   Procedure: LEFT HEART CATH;  Surgeon: Hillary Bow, MD;  Location: [facility] CATH LAB;  Service: Cardiovascular;;  . LEFT HEART CATHETERIZATION WITH CORONARY ANGIOGRAM N/A 02/28/2012   Procedure: LEFT HEART CATHETERIZATION WITH  CORONARY ANGIOGRAM;  Surgeon: Hillary Bow, MD;  Location: [facility] CATH LAB;  Service: Cardiovascular;  Laterality: N/A;  . VAGINAL HYSTERECTOMY  1978    Review of Systems  Constitutional: Negative.  Negative for activity change, fatigue and fever.  HENT: Negative.   Eyes: Negative.   Respiratory: Negative.  Negative for cough.   Cardiovascular: Negative.  Negative for chest pain.  Gastrointestinal: Negative.  Negative for abdominal pain.  Endocrine: Negative.   Genitourinary: Negative.  Negative for dysuria.  Musculoskeletal: Positive for arthralgias, back pain and gait problem.  Skin: Negative.     Allergies as of 11/14/2016   No Known Allergies     Medication List       Accurate as of 11/14/16 11:59 PM. Always use your most recent med list.          aspirin 81 MG EC tablet Take 1 tablet (81 mg total) by mouth daily.   B-D INS SYRINGE 0.5CC/30GX1/2" 30G X 1/2" 0.5 ML Misc Generic drug:  Insulin Syringe-Needle U-100 USE 1 NEEDLE UP TO 5 TIMES DAILY   citalopram 20 MG tablet Commonly known as:  CELEXA Take 1 tablet (20 mg total) by mouth daily.   diltiazem 240 MG 24 hr capsule Commonly known as:  CARDIZEM CD Take 240 mg by  mouth daily.   gabapentin 400 MG capsule Commonly known as:  NEURONTIN Take 1 capsule (400 mg total) by mouth at bedtime. Take 1-3 capsules at bedtime   HUMULIN N [place] Inject 40 Units into the skin 2 (two) times daily.   ibuprofen 200 MG tablet Commonly known as:  ADVIL,MOTRIN Take 200 mg by mouth every 6 (six) hours as needed.   insulin regular 250 units/2.40m (100 units/mL) injection Commonly known as:  HUMULIN R Inject 0.35 mLs (35 Units total) into the skin 3 (three) times daily before meals. 10-20 units before meals   loratadine 10 MG tablet Commonly known as:  CLARITIN Take 1 tablet (10 mg total) by mouth daily.   losartan-hydrochlorothiazide 100-25 MG tablet Commonly known as:  HYZAAR Take 1 tablet by mouth daily.   metFORMIN 1000  MG tablet Commonly known as:  GLUCOPHAGE Take 1 tablet (1,000 mg total) by mouth 2 (two) times daily with a meal.   NONFORMULARY OR COMPOUNDED ITEM Testosterone cream compound   simvastatin 40 MG tablet Commonly known as:  ZOCOR Take 1 tablet (40 mg total) by mouth daily.   Vitamin D3 5000 units Tabs Take 5,000 Units by mouth daily.          Objective:    BP 137/78   Pulse 87   Temp 97.4 F (36.3 C) (Oral)   Ht _0  (1.702 m)   Wt 217 lb 6.4 oz (98.6 kg)   BMI 34.05 kg/m   No Known Allergies  Physical Exam  Constitutional: She is oriented to person, place, and time. She appears well-developed and well-nourished.  HENT:  Head: Normocephalic and atraumatic.  Right Ear: Tympanic membrane, external ear and ear canal normal.  Left Ear: Tympanic membrane, external ear and ear canal normal.  Nose: Nose normal. No rhinorrhea.  Mouth/Throat: Oropharynx is clear and moist and mucous membranes are normal. No oropharyngeal exudate or posterior oropharyngeal erythema.  Eyes: Conjunctivae and EOM are normal. Pupils are equal, round, and reactive to light.  Neck: Normal range of motion. Neck supple.  Cardiovascular: Normal rate, regular rhythm, normal heart sounds and intact distal pulses.   Pulmonary/Chest: Effort normal and breath sounds normal.  Abdominal: Soft. Bowel sounds are normal.  Musculoskeletal:       Left hip: She exhibits decreased range of motion and tenderness. She exhibits no crepitus.  Neurological: She is alert and oriented to person, place, and time. She has normal reflexes.  Skin: Skin is warm and dry. No rash noted.  Psychiatric: She has a normal mood and affect. Her behavior is normal. Judgment and thought content normal.    Results for orders placed or performed in visit on 11/14/16  CMP14+EGFR  Result Value Ref Range   Glucose 129 (H) 65 - 99 mg/dL   BUN 14 8 - 27 mg/dL   Creatinine, Ser 0.88 0.57 - 1.00 mg/dL   GFR calc non Af Amer 65 >59  mL/min/1.73   GFR calc Af Amer 75 >59 mL/min/1.73   BUN/Creatinine Ratio 16 12 - 28   Sodium 140 134 - 144 mmol/L   Potassium 4.5 3.5 - 5.2 mmol/L   Chloride 100 96 - 106 mmol/L   CO2 23 18 - 29 mmol/L   Calcium 9.3 8.7 - 10.3 mg/dL   Total Protein 6.5 6.0 - 8.5 g/dL   Albumin 4.1 3.5 - 4.8 g/dL   Globulin, Total 2.4 1.5 - 4.5 g/dL   Albumin/Globulin Ratio 1.7 1.2 - 2.2   Bilirubin Total  0.4 0.0 - 1.2 mg/dL   Alkaline Phosphatase 75 39 - 117 IU/L   AST 25 0 - 40 IU/L   ALT 20 0 - 32 IU/L  Bayer DCA Hb A1c Waived  Result Value Ref Range   Bayer DCA Hb A1c Waived 7.1 (H) <7.0 %  Lipid panel  Result Value Ref Range   Cholesterol, Total 133 100 - 199 mg/dL   Triglycerides 151 (H) 0 - 149 mg/dL   HDL 42 >39 mg/dL   VLDL Cholesterol Cal 30 5 - 40 mg/dL   LDL Calculated 61 0 - 99 mg/dL   Chol/HDL Ratio 3.2 0.0 - 4.4 ratio units      Assessment & Plan:   1. Essential hypertension - losartan-hydrochlorothiazide (HYZAAR) 100-25 MG tablet; Take 1 tablet by mouth daily.  Dispense: 90 tablet; Refill: 3  2. Type 2 diabetes mellitus without complication, with long-term current use of insulin (HCC) - metFORMIN (GLUCOPHAGE) 1000 MG tablet; Take 1 tablet (1,000 mg total) by mouth 2 (two) times daily with a meal.  Dispense: 180 tablet; Refill: 3 - insulin regular (HUMULIN R) 250 units/2.48m (100 units/mL) injection; Inject 0.35 mLs (35 Units total) into the skin 3 (three) times daily before meals. 10-20 units before meals  Dispense: 60 mL; Refill: 3 - CMP14+EGFR - Bayer DCA Hb A1c Waived - Lipid panel  3. Renal insufficiency - CMP14+EGFR  4. HYPERCHOLESTEROLEMIA - simvastatin (ZOCOR) 40 MG tablet; Take 1 tablet (40 mg total) by mouth daily.  Dispense: 90 tablet; Refill: 3 - Lipid panel   Continue all other maintenance medications as listed above.  Follow up plan: Return in about 3 months (around 02/14/2017) for recheck.  Educational handout given for carb counting  ATerald Sleeper PA-C [id number] [place] [phone]  11/15/2016, 3:13 PM

## 2016-11-18 ENCOUNTER — Other Ambulatory Visit: Payer: Self-pay | Admitting: Physician Assistant

## 2016-11-18 DIAGNOSIS — E119 Type 2 diabetes mellitus without complications: Secondary | ICD-10-CM

## 2016-11-18 DIAGNOSIS — Z794 Long term (current) use of insulin: Principal | ICD-10-CM

## 2016-11-18 MED ORDER — INSULIN REGULAR HUMAN 100 UNIT/ML IJ SOLN: 10.0000 [IU] | Freq: Three times a day (TID) | INTRAMUSCULAR | 3 refills | Status: DC

## 2016-12-04 ENCOUNTER — Ambulatory Visit: Payer: Medicare Other | Admitting: Obstetrics and Gynecology

## 2016-12-09 ENCOUNTER — Other Ambulatory Visit: Payer: Self-pay | Admitting: Physician Assistant

## 2016-12-18 ENCOUNTER — Ambulatory Visit: Payer: Medicare Other | Admitting: Obstetrics and Gynecology

## 2016-12-20 ENCOUNTER — Other Ambulatory Visit: Payer: Self-pay | Admitting: Physician Assistant

## 2016-12-25 ENCOUNTER — Encounter: Payer: Self-pay | Admitting: Obstetrics and Gynecology

## 2016-12-25 ENCOUNTER — Ambulatory Visit (INDEPENDENT_AMBULATORY_CARE_PROVIDER_SITE_OTHER): Payer: Medicare Other | Admitting: Obstetrics and Gynecology

## 2016-12-25 VITALS — BP 138/80 | HR 84 | Ht 67.0 in | Wt 220.8 lb

## 2016-12-25 DIAGNOSIS — Z6841 Body Mass Index (BMI) 40.0 and over, adult: Secondary | ICD-10-CM

## 2016-12-25 DIAGNOSIS — N904 Leukoplakia of vulva: Secondary | ICD-10-CM | POA: Diagnosis not present

## 2016-12-25 DIAGNOSIS — E6609 Other obesity due to excess calories: Secondary | ICD-10-CM

## 2016-12-25 DIAGNOSIS — IMO0001 Reserved for inherently not codable concepts without codable children: Secondary | ICD-10-CM

## 2016-12-25 MED ORDER — FLUCONAZOLE 150 MG PO TABS: 150.0000 mg | ORAL_TABLET | Freq: Once | ORAL | 1 refills | Status: AC

## 2016-12-25 NOTE — Patient Instructions (Signed)
Take diflucan weekly

## 2016-12-25 NOTE — Progress Notes (Signed)
[facility] Visit  @DATE @            Patient name: Adrienne Cook MRN [id number]  Date of birth: 22-Jul-1941  CC & HPI:  Adrienne Cook is a 76 y.o. female presenting today for follow up. 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 She had failed trial of estrogen topical cream so she was started on testosterone propionate 2% cream BID. She reports having vulvar itching last week so she stopped using the testosterone cream.    ROS:  ROS +vulvar itching/irritation  -fever   Pertinent History Reviewed:   Reviewed: Significant for vaginal hysterectomy  Medical         Past Medical History:  Diagnosis Date  . 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 Chest pain    a. normal cath in 2004;  b. nl stress echo 2011;  c. 02/2012 Cath: nl cors, EF 65%.  . DM (diabetes mellitus) ([place])    insulin dependent  . HTN (hypertension)   . Hypercholesterolemia   . Irregular heartbeat    a. normal event monitor  . Obesity   . PONV (postoperative nausea and vomiting)   . Seasonal allergies   . Sleep apnea    does not tolerate CPAP                              Surgical Hx:    Past Surgical History:  Procedure Laterality Date  . APPENDECTOMY    . BREAST CYST EXCISION     benign  . CARPAL TUNNEL RELEASE    . CHOLECYSTECTOMY    . COLONOSCOPY  02/13/2012  . EXAMINATION UNDER ANESTHESIA     and sphincterotomy  . HAMMER TOE SURGERY     bilateral; 5th toes  . LEFT HEART CATH  02/28/2012   Procedure: LEFT HEART CATH;  Surgeon: Hillary Bow, MD;  Location: [facility] CATH LAB;  Service: Cardiovascular;;  . LEFT HEART CATHETERIZATION WITH CORONARY ANGIOGRAM N/A 02/28/2012   Procedure: LEFT HEART CATHETERIZATION WITH CORONARY ANGIOGRAM;  Surgeon: Hillary Bow, MD;  Location: [facility] CATH LAB;  Service: Cardiovascular;  Laterality: N/A;  . VAGINAL HYSTERECTOMY  1978   Medications: Reviewed & Updated - see associated section                        Current Outpatient Prescriptions:  .  aspirin 81 MG EC tablet, Take 1 tablet (81 mg total) by mouth daily., Disp: , Rfl:  .  B-D INS SYRINGE 0.5CC/30GX1/2" 30G X 1/2" 0.5 ML MISC, USE 1 NEEDLE UP TO 5 TIMES DAILY, Disp: 300 each, Rfl: 7 .  Cholecalciferol (VITAMIN D3) 5000 UNITS TABS, Take 5,000 Units by mouth daily., Disp: , Rfl:  .  citalopram (CELEXA) 20 MG tablet, Take 1 tablet (20 mg total) by mouth daily., Disp: 90 tablet, Rfl: 3 .  diltiazem (CARDIZEM CD) 240 MG 24 hr capsule, TAKE ONE CAPSULE BY MOUTH ON AN EMPTY STOMACH IN THE MORNING, Disp: 90 capsule, Rfl: 0 .  gabapentin (NEURONTIN) 400 MG capsule, Take 1 capsule (400 mg total) by mouth at bedtime. Take 1-3 capsules at bedtime, Disp: 90 capsule, Rfl: 11 .  HUMULIN N 100 UNIT/ML injection, 20 UNITS TWICE A DAY, Disp: 20 mL, Rfl: 1 .  ibuprofen (ADVIL,MOTRIN) 200 MG tablet, Take 200 mg by mouth every  6 (six) hours as needed., Disp: , Rfl:  .  Insulin Isophane Human (HUMULIN N [place]), Inject 40 Units into the skin 2 (two) times daily. , Disp: , Rfl:  .  insulin regular (HUMULIN R) 250 units/2.40mL (100 units/mL) injection, Inject 0.1-0.2 mLs (10-20 Units total) into the skin 3 (three) times daily before meals. 10-20 units before meals, Disp: 60 mL, Rfl: 3 .  loratadine (CLARITIN) 10 MG tablet, Take 1 tablet (10 mg total) by mouth daily., Disp: 90 tablet, Rfl: 3 .  losartan-hydrochlorothiazide (HYZAAR) 100-25 MG tablet, Take 1 tablet by mouth daily., Disp: 90 tablet, Rfl: 3 .  metFORMIN (GLUCOPHAGE) 1000 MG tablet, Take 1 tablet (1,000 mg total) by mouth 2 (two) times daily with a meal., Disp: 180 tablet, Rfl: 3 .  simvastatin (ZOCOR) 40 MG tablet, Take 1 tablet (40 mg total) by mouth daily., Disp: 90 tablet, Rfl: 3   Social History: Reviewed -  reports that she has never smoked. She has never used smokeless tobacco.  Objective Findings:  Vitals: Blood pressure 138/80, pulse 84, height 5\' 7"  (1.702 m), weight 220 lb 12.8 oz (100.2  kg).  Physical Examination: General appearance - alert, well appearing, and in no distress Mental status - alert, oriented to person, place, and time Pelvic -  VULVA: atrophic vulvar tissues, moisture trapping secondary to obesity  VAGINA: atrophic   Assessment & Plan:   A:  1. Vulvar yeast  2. Vulvar dystrophy 3. DM 4. Weight gain  P:  1. Start Diflucan once a week for 4 weeks 2. Continue using testosterone propionate 2% cream 3. Counseled on weight loss and advised to join silver sneakers and water exercise.  4. Follow up 3 months     By signing my name below, I, [name], attest that this documentation has been prepared under the direction and in the presence of Jonnie Kind, MD. Electronically Signed: [name], Education administrator. 12/25/16. 12:26 PM.  I personally performed the services described in this documentation, which was SCRIBED in my presence. The recorded information has been reviewed and considered accurate. It has been edited as necessary during review. Jonnie Kind, MD

## 2017-01-22 ENCOUNTER — Encounter: Payer: Self-pay | Admitting: Physician Assistant

## 2017-02-14 ENCOUNTER — Ambulatory Visit (INDEPENDENT_AMBULATORY_CARE_PROVIDER_SITE_OTHER): Payer: Medicare Other | Admitting: Physician Assistant

## 2017-02-14 ENCOUNTER — Encounter: Payer: Self-pay | Admitting: Physician Assistant

## 2017-02-14 VITALS — BP 134/73 | HR 90 | Temp 99.1°F | Ht 67.0 in | Wt 218.0 lb

## 2017-02-14 DIAGNOSIS — Z794 Long term (current) use of insulin: Secondary | ICD-10-CM

## 2017-02-14 DIAGNOSIS — E119 Type 2 diabetes mellitus without complications: Secondary | ICD-10-CM | POA: Diagnosis not present

## 2017-02-14 DIAGNOSIS — I10 Essential (primary) hypertension: Secondary | ICD-10-CM

## 2017-02-14 LAB — BAYER DCA HB A1C WAIVED: HB A1C (BAYER DCA - WAIVED): 7.5 % — ABNORMAL HIGH (ref <7.0)

## 2017-02-14 MED ORDER — LOSARTAN POTASSIUM-HCTZ 100-25 MG PO TABS: 1.0000 | ORAL_TABLET | Freq: Every day | ORAL | 3 refills | Status: DC

## 2017-02-14 MED ORDER — METFORMIN HCL 1000 MG PO TABS: 1000.0000 mg | ORAL_TABLET | Freq: Two times a day (BID) | ORAL | 3 refills | Status: DC

## 2017-02-14 MED ORDER — INSULIN NPH (HUMAN) (ISOPHANE) 100 UNIT/ML ~~LOC~~ SUSP: 40.0000 [IU] | Freq: Two times a day (BID) | SUBCUTANEOUS | 11 refills | Status: DC

## 2017-02-14 NOTE — Patient Instructions (Addendum)
[facility]  Carbohydrate Counting for Diabetes Mellitus, Adult Carbohydrate counting is a method for keeping track of how many carbohydrates you eat. Eating carbohydrates naturally increases the amount of sugar (glucose) in the blood. Counting how many carbohydrates you eat helps keep your blood glucose within normal limits, which helps you manage your diabetes (diabetes mellitus). It is important to know how many carbohydrates you can safely have in each meal. This is different for every person. A diet and nutrition specialist (registered dietitian) can help you make a meal plan and calculate how many carbohydrates you should have at each meal and snack. Carbohydrates are found in the following foods:  Grains, such as breads and cereals.  Dried beans and soy products.  Starchy vegetables, such as potatoes, peas, and corn.  Fruit and fruit juices.  Milk and yogurt.  Sweets and snack foods, such as cake, cookies, candy, chips, and soft drinks.  How do I count carbohydrates? There are two ways to count carbohydrates in food. You can use either of the methods or a combination of both. Reading "Nutrition Facts" on packaged food The "Nutrition Facts" list is included on the labels of almost all packaged foods and beverages in the [place] It includes:  The serving size.  Information about nutrients in each serving, including the grams (g) of carbohydrate per serving.  To use the "Nutrition Facts":  Decide how many servings you will have.  Multiply the number of servings by the number of carbohydrates per serving.  The resulting number is the total amount of carbohydrates that you will be having.  Learning standard serving sizes of other foods When you eat foods containing carbohydrates that are not packaged or do not include "Nutrition Facts" on the label, you need to measure the servings in order to count the amount of carbohydrates:  Measure the foods that you will eat with a  food scale or measuring cup, if needed.  Decide how many standard-size servings you will eat.  Multiply the number of servings by 15. Most carbohydrate-rich foods have about 15 g of carbohydrates per serving. ? For example, if you eat 8 oz (170 g) of strawberries, you will have eaten 2 servings and 30 g of carbohydrates (2 servings x 15 g = 30 g).  For foods that have more than one food mixed, such as soups and casseroles, you must count the carbohydrates in each food that is included.  The following list contains standard serving sizes of common carbohydrate-rich foods. Each of these servings has about 15 g of carbohydrates:   hamburger bun or  English muffin.   oz (15 mL) syrup.   oz (14 g) jelly.  1 slice of bread.  1 six-inch tortilla.  3 oz (85 g) cooked rice or pasta.  4 oz (113 g) cooked dried beans.  4 oz (113 g) starchy vegetable, such as peas, corn, or potatoes.  4 oz (113 g) hot cereal.  4 oz (113 g) mashed potatoes or  of a large baked potato.  4 oz (113 g) canned or frozen fruit.  4 oz (120 mL) fruit juice.  4-6 crackers.  6 chicken nuggets.  6 oz (170 g) unsweetened dry cereal.  6 oz (170 g) plain fat-free yogurt or yogurt sweetened with artificial sweeteners.  8 oz (240 mL) milk.  8 oz (170 g) fresh fruit or one small piece of fruit.  24 oz (680 g) popped popcorn.  Example of carbohydrate counting Sample meal  3 oz (85  g) chicken breast.  6 oz (170 g) brown rice.  4 oz (113 g) corn.  8 oz (240 mL) milk.  8 oz (170 g) strawberries with sugar-free whipped topping. Carbohydrate calculation 1. Identify the foods that contain carbohydrates: ? Rice. ? Corn. ? Milk. ? Strawberries. 2. Calculate how many servings you have of each food: ? 2 servings rice. ? 1 serving corn. ? 1 serving milk. ? 1 serving strawberries. 3. Multiply each number of servings by 15 g: ? 2 servings rice x 15 g = 30 g. ? 1 serving corn x 15 g = 15 g. ? 1  serving milk x 15 g = 15 g. ? 1 serving strawberries x 15 g = 15 g. 4. Add together all of the amounts to find the total grams of carbohydrates eaten: ? 30 g + 15 g + 15 g + 15 g = 75 g of carbohydrates total. This information is not intended to replace advice given to you by your health care provider. Make sure you discuss any questions you have with your health care provider. Document Released: 09/02/2005 Document Revised: 03/22/2016 Document Reviewed: 02/14/2016 Elsevier Interactive Patient Education  [employer].

## 2017-02-16 NOTE — Progress Notes (Signed)
BP 134/73   Pulse 90   Temp 99.1 F (37.3 C) (Oral)   Ht 5\' 7"  (1.702 m)   Wt 218 lb (98.9 kg)   BMI 34.14 kg/m    Subjective:    Patient ID: Adrienne Cook, female    DOB: 09-Oct-1940, 75 y.o.   MRN: [id number]  HPI: Adrienne Cook is a 76 y.o. female presenting on 02/14/2017 for Follow-up (3 month )  This patient comes in for periodic recheck on medications and conditions including diabetes, insulin dependent, hypertension, hyperlipidemia.   All medications are reviewed today. There are no reports of any problems with the medications. All of the medical conditions are reviewed and updated.  Lab work is reviewed and will be ordered as medically necessary. There are no new problems reported with today's visit.   Relevant past medical, surgical, family and social history reviewed and updated as indicated. Allergies and medications reviewed and updated.  Past Medical History:  Diagnosis Date  . 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 Chest pain    a. normal cath in 2004;  b. nl stress echo 2011;  c. 02/2012 Cath: nl cors, EF 65%.  . DM (diabetes mellitus) ([place])    insulin dependent  . HTN (hypertension)   . Hypercholesterolemia   . Irregular heartbeat    a. normal event monitor  . Obesity   . PONV (postoperative nausea and vomiting)   . Seasonal allergies   . Sleep apnea    does not tolerate CPAP    Past Surgical History:  Procedure Laterality Date  . APPENDECTOMY    . BREAST CYST EXCISION     benign  . CARPAL TUNNEL RELEASE    . CHOLECYSTECTOMY    . COLONOSCOPY  02/13/2012  . EXAMINATION UNDER ANESTHESIA     and sphincterotomy  . HAMMER TOE SURGERY     bilateral; 5th toes  . LEFT HEART CATH  02/28/2012   Procedure: LEFT HEART CATH;  Surgeon: Hillary Bow, MD;  Location: [facility] CATH LAB;  Service: Cardiovascular;;  . LEFT HEART CATHETERIZATION WITH CORONARY ANGIOGRAM N/A 02/28/2012   Procedure: LEFT HEART CATHETERIZATION WITH CORONARY ANGIOGRAM;  Surgeon: Hillary Bow, MD;   Location: [facility] CATH LAB;  Service: Cardiovascular;  Laterality: N/A;  . VAGINAL HYSTERECTOMY  1978    Review of Systems  Constitutional: Negative.  Negative for activity change, fatigue and fever.  HENT: Negative.   Eyes: Negative.   Respiratory: Negative.  Negative for cough.   Cardiovascular: Negative.  Negative for chest pain.  Gastrointestinal: Negative.  Negative for abdominal pain.  Endocrine: Negative.   Genitourinary: Negative.  Negative for dysuria.  Musculoskeletal: Negative.   Skin: Negative.   Neurological: Negative.     Allergies as of 02/14/2017   No Known Allergies     Medication List       Accurate as of 02/14/17 11:59 PM. Always use your most recent med list.          aspirin 81 MG EC tablet Take 1 tablet (81 mg total) by mouth daily.   B-D INS SYRINGE 0.5CC/30GX1/2" 30G X 1/2" 0.5 ML Misc Generic drug:  Insulin Syringe-Needle U-100 USE 1 NEEDLE UP TO 5 TIMES DAILY   citalopram 20 MG tablet Commonly known as:  CELEXA Take 1 tablet (20 mg total) by mouth daily.   diltiazem 240 MG 24 hr capsule Commonly known as:  CARDIZEM CD TAKE ONE CAPSULE BY MOUTH ON AN EMPTY STOMACH IN THE  MORNING   gabapentin 400 MG capsule Commonly known as:  NEURONTIN Take 1 capsule (400 mg total) by mouth at bedtime. Take 1-3 capsules at bedtime   ibuprofen 200 MG tablet Commonly known as:  ADVIL,MOTRIN Take 200 mg by mouth every 6 (six) hours as needed.   insulin NPH Human 100 UNIT/ML injection Commonly known as:  HUMULIN N Inject 0.4 mLs (40 Units total) into the skin 2 (two) times daily.   insulin regular 100 units/mL injection Commonly known as:  HUMULIN R Inject 0.1-0.2 mLs (10-20 Units total) into the skin 3 (three) times daily before meals. 10-20 units before meals   loratadine 10 MG tablet Commonly known as:  CLARITIN Take 1 tablet (10 mg total) by mouth daily.   losartan-hydrochlorothiazide 100-25 MG tablet Commonly known as:  HYZAAR Take 1 tablet by mouth  daily.   metFORMIN 1000 MG tablet Commonly known as:  GLUCOPHAGE Take 1 tablet (1,000 mg total) by mouth 2 (two) times daily with a meal.   simvastatin 40 MG tablet Commonly known as:  ZOCOR Take 1 tablet (40 mg total) by mouth daily.   Vitamin D3 5000 units Tabs Take 5,000 Units by mouth daily.          Objective:    BP 134/73   Pulse 90   Temp 99.1 F (37.3 C) (Oral)   Ht 5\' 7"  (1.702 m)   Wt 218 lb (98.9 kg)   BMI 34.14 kg/m   No Known Allergies  Physical Exam  Constitutional: She is oriented to person, place, and time. She appears well-developed and well-nourished.  HENT:  Head: Normocephalic and atraumatic.  Eyes: Conjunctivae and EOM are normal. Pupils are equal, round, and reactive to light.  Cardiovascular: Normal rate, regular rhythm, normal heart sounds and intact distal pulses.   Pulmonary/Chest: Effort normal and breath sounds normal.  Abdominal: Soft. Bowel sounds are normal.  Neurological: She is alert and oriented to person, place, and time. She has normal reflexes.  Skin: Skin is warm and dry. No rash noted.  Psychiatric: She has a normal mood and affect. Her behavior is normal. Judgment and thought content normal.  Nursing note and vitals reviewed.   Results for orders placed or performed in visit on 02/14/17  Bayer DCA Hb A1c Waived  Result Value Ref Range   Bayer DCA Hb A1c Waived 7.5 (H) <7.0 %      Assessment & Plan:   1. Type 2 diabetes mellitus without complication, with long-term current use of insulin (HCC) - Bayer DCA Hb A1c Waived - metFORMIN (GLUCOPHAGE) 1000 MG tablet; Take 1 tablet (1,000 mg total) by mouth 2 (two) times daily with a meal.  Dispense: 180 tablet; Refill: 3 - insulin NPH Human (HUMULIN N) 100 UNIT/ML injection; Inject 0.4 mLs (40 Units total) into the skin 2 (two) times daily.  Dispense: 20 mL; Refill: 11  2. Essential hypertension - losartan-hydrochlorothiazide (HYZAAR) 100-25 MG tablet; Take 1 tablet by mouth  daily.  Dispense: 90 tablet; Refill: 3   Current Outpatient Prescriptions:  .  aspirin 81 MG EC tablet, Take 1 tablet (81 mg total) by mouth daily., Disp: , Rfl:  .  B-D INS SYRINGE 0.5CC/30GX1/2" 30G X 1/2" 0.5 ML MISC, USE 1 NEEDLE UP TO 5 TIMES DAILY, Disp: 300 each, Rfl: 7 .  Cholecalciferol (VITAMIN D3) 5000 UNITS TABS, Take 5,000 Units by mouth daily., Disp: , Rfl:  .  citalopram (CELEXA) 20 MG tablet, Take 1 tablet (20 mg total) by  mouth daily., Disp: 90 tablet, Rfl: 3 .  diltiazem (CARDIZEM CD) 240 MG 24 hr capsule, TAKE ONE CAPSULE BY MOUTH ON AN EMPTY STOMACH IN THE MORNING, Disp: 90 capsule, Rfl: 0 .  gabapentin (NEURONTIN) 400 MG capsule, Take 1 capsule (400 mg total) by mouth at bedtime. Take 1-3 capsules at bedtime, Disp: 90 capsule, Rfl: 11 .  ibuprofen (ADVIL,MOTRIN) 200 MG tablet, Take 200 mg by mouth every 6 (six) hours as needed., Disp: , Rfl:  .  insulin NPH Human (HUMULIN N) 100 UNIT/ML injection, Inject 0.4 mLs (40 Units total) into the skin 2 (two) times daily., Disp: 20 mL, Rfl: 11 .  insulin regular (HUMULIN R) 250 units/2.70mL (100 units/mL) injection, Inject 0.1-0.2 mLs (10-20 Units total) into the skin 3 (three) times daily before meals. 10-20 units before meals, Disp: 60 mL, Rfl: 3 .  loratadine (CLARITIN) 10 MG tablet, Take 1 tablet (10 mg total) by mouth daily., Disp: 90 tablet, Rfl: 3 .  losartan-hydrochlorothiazide (HYZAAR) 100-25 MG tablet, Take 1 tablet by mouth daily., Disp: 90 tablet, Rfl: 3 .  metFORMIN (GLUCOPHAGE) 1000 MG tablet, Take 1 tablet (1,000 mg total) by mouth 2 (two) times daily with a meal., Disp: 180 tablet, Rfl: 3 .  simvastatin (ZOCOR) 40 MG tablet, Take 1 tablet (40 mg total) by mouth daily., Disp: 90 tablet, Rfl: 3  Continue all other maintenance medications as listed above.  Follow up plan: Return in about 3 months (around 05/17/2017) for recheck.  Educational handout given for carb counting  Terald Sleeper PA-C [facility] [address]  [place], [place] [address] [phone]   02/16/2017, 8:58 PM

## 2017-02-17 ENCOUNTER — Telehealth: Payer: Self-pay | Admitting: Physician Assistant

## 2017-02-17 ENCOUNTER — Other Ambulatory Visit: Payer: Self-pay | Admitting: Physician Assistant

## 2017-02-17 DIAGNOSIS — E119 Type 2 diabetes mellitus without complications: Secondary | ICD-10-CM

## 2017-02-17 DIAGNOSIS — Z794 Long term (current) use of insulin: Principal | ICD-10-CM

## 2017-02-18 NOTE — Telephone Encounter (Signed)
Per on line formulary - Toujeo, Lantus or Levemir are considered tier 3.  Would cost $45 per month until reaches coverage gap and then cost would be 58% of medication cost.   Basaglar, Relion NPH nor Tyler Aas were found on formulary.

## 2017-02-19 NOTE — Telephone Encounter (Signed)
noted 

## 2017-03-13 ENCOUNTER — Other Ambulatory Visit: Payer: Self-pay | Admitting: Physician Assistant

## 2017-03-26 ENCOUNTER — Ambulatory Visit: Payer: Medicare Other | Admitting: Obstetrics and Gynecology

## 2017-05-23 ENCOUNTER — Ambulatory Visit (INDEPENDENT_AMBULATORY_CARE_PROVIDER_SITE_OTHER): Payer: Medicare Other | Admitting: Physician Assistant

## 2017-05-23 ENCOUNTER — Encounter: Payer: Self-pay | Admitting: Physician Assistant

## 2017-05-23 VITALS — BP 141/78 | HR 81 | Temp 98.0°F | Ht 67.0 in | Wt 217.2 lb

## 2017-05-23 DIAGNOSIS — G5602 Carpal tunnel syndrome, left upper limb: Secondary | ICD-10-CM | POA: Insufficient documentation

## 2017-05-23 DIAGNOSIS — E119 Type 2 diabetes mellitus without complications: Secondary | ICD-10-CM

## 2017-05-23 DIAGNOSIS — R42 Dizziness and giddiness: Secondary | ICD-10-CM | POA: Diagnosis not present

## 2017-05-23 DIAGNOSIS — R739 Hyperglycemia, unspecified: Secondary | ICD-10-CM | POA: Diagnosis not present

## 2017-05-23 DIAGNOSIS — Z794 Long term (current) use of insulin: Secondary | ICD-10-CM

## 2017-05-23 LAB — CMP14+EGFR
A/G RATIO: 1.7 (ref 1.2–2.2)
ALK PHOS: 75 IU/L (ref 39–117)
ALT: 14 IU/L (ref 0–32)
AST: 18 IU/L (ref 0–40)
Albumin: 4.2 g/dL (ref 3.5–4.8)
BUN/Creatinine Ratio: 24 (ref 12–28)
BUN: 18 mg/dL (ref 8–27)
Bilirubin Total: 0.4 mg/dL (ref 0.0–1.2)
CALCIUM: 9.7 mg/dL (ref 8.7–10.3)
CHLORIDE: 101 mmol/L (ref 96–106)
CO2: 22 mmol/L (ref 20–29)
Creatinine, Ser: 0.76 mg/dL (ref 0.57–1.00)
GFR calc Af Amer: 89 mL/min/{1.73_m2} (ref >59)
GFR, EST NON AFRICAN AMERICAN: 78 mL/min/{1.73_m2} (ref >59)
Globulin, Total: 2.5 g/dL (ref 1.5–4.5)
Glucose: 105 mg/dL — ABNORMAL HIGH (ref 65–99)
POTASSIUM: 4.6 mmol/L (ref 3.5–5.2)
SODIUM: 140 mmol/L (ref 134–144)
Total Protein: 6.7 g/dL (ref 6.0–8.5)

## 2017-05-23 LAB — CBC WITH DIFFERENTIAL/PLATELET
BASOS ABS: 0 10*3/uL (ref 0.0–0.2)
Basos: 0 %
EOS (ABSOLUTE): 0.1 10*3/uL (ref 0.0–0.4)
Eos: 1 %
HEMOGLOBIN: 12.5 g/dL (ref 11.1–15.9)
Hematocrit: 39 % (ref 34.0–46.6)
Immature Grans (Abs): 0 10*3/uL (ref 0.0–0.1)
Immature Granulocytes: 0 %
LYMPHS ABS: 2 10*3/uL (ref 0.7–3.1)
Lymphs: 22 %
MCH: 29.3 pg (ref 26.6–33.0)
MCHC: 32.1 g/dL (ref 31.5–35.7)
MCV: 92 fL (ref 79–97)
MONOCYTES: 8 %
Monocytes Absolute: 0.7 10*3/uL (ref 0.1–0.9)
NEUTROS ABS: 6.2 10*3/uL (ref 1.4–7.0)
Neutrophils: 69 %
PLATELETS: 400 10*3/uL — AB (ref 150–379)
RBC: 4.26 x10E6/uL (ref 3.77–5.28)
RDW: 14.8 % (ref 12.3–15.4)
WBC: 9.1 10*3/uL (ref 3.4–10.8)

## 2017-05-23 LAB — LIPID PANEL
CHOLESTEROL TOTAL: 132 mg/dL (ref 100–199)
Chol/HDL Ratio: 2.8 ratio (ref 0.0–4.4)
HDL: 47 mg/dL (ref >39)
LDL Calculated: 64 mg/dL (ref 0–99)
TRIGLYCERIDES: 103 mg/dL (ref 0–149)
VLDL Cholesterol Cal: 21 mg/dL (ref 5–40)

## 2017-05-23 LAB — BAYER DCA HB A1C WAIVED: HB A1C (BAYER DCA - WAIVED): 7.3 % — ABNORMAL HIGH (ref <7.0)

## 2017-05-23 MED ORDER — INSULIN REGULAR HUMAN 100 UNIT/ML IJ SOLN: 10.0000 [IU] | Freq: Three times a day (TID) | INTRAMUSCULAR | 5 refills | Status: DC

## 2017-05-23 MED ORDER — INSULIN NPH (HUMAN) (ISOPHANE) 100 UNIT/ML ~~LOC~~ SUSP: 60.0000 [IU] | Freq: Two times a day (BID) | SUBCUTANEOUS | 11 refills | Status: DC

## 2017-05-23 MED ORDER — MECLIZINE HCL 25 MG PO TABS: 25.0000 mg | ORAL_TABLET | Freq: Three times a day (TID) | ORAL | 5 refills | Status: DC | PRN

## 2017-05-23 NOTE — Patient Instructions (Signed)
In a few days you may receive a survey in the mail or online from [employer] regarding your visit with us today. Please take a moment to fill this out. Your feedback is very important to our whole office. It can help us better understand your needs as well as improve your experience and satisfaction. Thank you for taking your time to complete it. We care about you.  Loman Logan, PA-C  

## 2017-05-26 NOTE — Progress Notes (Signed)
BP (!) 141/78   Pulse 81   Temp 98 F (36.7 C) (Oral)   Ht 5' 7" (1.702 m)   Wt 217 lb 3.2 oz (98.5 kg)   BMI 34.02 kg/m    Subjective:    Patient ID: Adrienne Cook, female    DOB: [birth year], 76 y.o.   MRN: [id number]  HPI: Adrienne Cook is a 76 y.o. female presenting on 05/23/2017 for Diabetes (3 month recheck)  This patient comes in for periodic recheck on medications and conditions including diabetes, carpal tunnel history, vertigo, hypertension.  Medicationa re stable. Vertigo episodes are more frequent. She has not been incapacitated from it.  Reports having some elevated blood glucose readings in recent weeks.   All medications are reviewed today. There are no reports of any problems with the medications. All of the medical conditions are reviewed and updated.  Lab work is reviewed and will be ordered as medically necessary. There are no new problems reported with today's visit.   Relevant past medical, surgical, family and social history reviewed and updated as indicated. Allergies and medications reviewed and updated.  Past Medical History:  Diagnosis Date  . 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 Chest pain    a. normal cath in 2004;  b. nl stress echo 2011;  c. 02/2012 Cath: nl cors, EF 65%.  . DM (diabetes mellitus) ([place])    insulin dependent  . HTN (hypertension)   . Hypercholesterolemia   . Irregular heartbeat    a. normal event monitor  . Obesity   . PONV (postoperative nausea and vomiting)   . Seasonal allergies   . Sleep apnea    does not tolerate CPAP    Past Surgical History:  Procedure Laterality Date  . APPENDECTOMY    . BREAST CYST EXCISION     benign  . CARPAL TUNNEL RELEASE    . CHOLECYSTECTOMY    . COLONOSCOPY  02/13/2012  . EXAMINATION UNDER ANESTHESIA     and sphincterotomy  . HAMMER TOE SURGERY     bilateral; 5th toes  . LEFT HEART CATH  02/28/2012   Procedure: LEFT HEART CATH;  Surgeon: Hillary Bow, MD;  Location: [facility] CATH LAB;  Service:  Cardiovascular;;  . LEFT HEART CATHETERIZATION WITH CORONARY ANGIOGRAM N/A 02/28/2012   Procedure: LEFT HEART CATHETERIZATION WITH CORONARY ANGIOGRAM;  Surgeon: Hillary Bow, MD;  Location: [facility] CATH LAB;  Service: Cardiovascular;  Laterality: N/A;  . VAGINAL HYSTERECTOMY  1978    Review of Systems  Constitutional: Negative for activity change, fatigue and fever.  HENT: Negative.   Eyes: Negative.   Respiratory: Negative.  Negative for cough.   Cardiovascular: Negative.  Negative for chest pain.  Gastrointestinal: Negative.  Negative for abdominal pain.  Endocrine: Negative.   Genitourinary: Negative.  Negative for dysuria.  Musculoskeletal: Positive for arthralgias.  Skin: Negative.   Neurological: Positive for dizziness and weakness. Negative for light-headedness, numbness and headaches.    Allergies as of 05/23/2017   No Known Allergies     Medication List       Accurate as of 05/23/17 11:59 PM. Always use your most recent med list.          aspirin 81 MG EC tablet Take 1 tablet (81 mg total) by mouth daily.   B-D INS SYRINGE 0.5CC/30GX1/2" 30G X 1/2" 0.5 ML Misc Generic drug:  Insulin Syringe-Needle U-100 USE 1 NEEDLE UP TO 5 TIMES DAILY   citalopram 20 MG tablet  Commonly known as:  CELEXA TAKE 1 TABLET (20 MG TOTAL) BY MOUTH DAILY.   diltiazem 240 MG 24 hr capsule Commonly known as:  CARDIZEM CD TAKE ONE CAPSULE BY MOUTH ON AN EMPTY STOMACH IN THE MORNING   gabapentin 400 MG capsule Commonly known as:  NEURONTIN Take 1 capsule (400 mg total) by mouth at bedtime. Take 1-3 capsules at bedtime   ibuprofen 200 MG tablet Commonly known as:  ADVIL,MOTRIN Take 200 mg by mouth every 6 (six) hours as needed.   insulin NPH Human 100 UNIT/ML injection Commonly known as:  HUMULIN N Inject 0.6 mLs (60 Units total) into the skin 2 (two) times daily.   insulin regular 100 units/mL injection Commonly known as:  HUMULIN R Inject 0.1-0.2 mLs (10-20 Units total) into the  skin 3 (three) times daily before meals. 10-20 units before meals   loratadine 10 MG tablet Commonly known as:  CLARITIN Take 1 tablet (10 mg total) by mouth daily.   losartan-hydrochlorothiazide 100-25 MG tablet Commonly known as:  HYZAAR Take 1 tablet by mouth daily.   meclizine 25 MG tablet Commonly known as:  ANTIVERT Take 1 tablet (25 mg total) by mouth 3 (three) times daily as needed for dizziness.   metFORMIN 1000 MG tablet Commonly known as:  GLUCOPHAGE Take 1 tablet (1,000 mg total) by mouth 2 (two) times daily with a meal.   ONE TOUCH ULTRA TEST test strip Generic drug:  glucose blood TEST TWICE DAILY IN VITRO 90 DAYS   simvastatin 40 MG tablet Commonly known as:  ZOCOR Take 1 tablet (40 mg total) by mouth daily.   Vitamin D3 5000 units Tabs Take 5,000 Units by mouth daily.            Discharge Care Instructions        Start     Ordered   05/23/17 0000  Bayer DCA Hb A1c Waived     05/23/17 0846   05/23/17 0000  insulin NPH Human (HUMULIN N) 100 UNIT/ML injection  2 times daily    Question:  Supervising Provider  Answer:  Timmothy Euler   05/23/17 0911   05/23/17 0000  insulin regular (HUMULIN R) 100 units/mL injection  3 times daily before meals    Question:  Supervising Provider  Answer:  Timmothy Euler   05/23/17 0911   05/23/17 0000  meclizine (ANTIVERT) 25 MG tablet  3 times daily PRN    Question:  Supervising Provider  Answer:  Timmothy Euler   05/23/17 0911   05/23/17 0000  CBC with Differential/Platelet     05/23/17 0913   05/23/17 0000  CMP14+EGFR     05/23/17 0913   05/23/17 0000  Lipid panel     05/23/17 0913         Objective:    BP (!) 141/78   Pulse 81   Temp 98 F (36.7 C) (Oral)   Ht 5' 7" (1.702 m)   Wt 217 lb 3.2 oz (98.5 kg)   BMI 34.02 kg/m   No Known Allergies  Physical Exam  Constitutional: She is oriented to person, place, and time. She appears well-developed and well-nourished.  HENT:  Head:  Normocephalic and atraumatic.  Right Ear: Tympanic membrane, external ear and ear canal normal.  Left Ear: Tympanic membrane, external ear and ear canal normal.  Nose: Nose normal. No rhinorrhea.  Mouth/Throat: Oropharynx is clear and moist and mucous membranes are normal. No oropharyngeal exudate or posterior oropharyngeal erythema.  Eyes: Pupils are equal, round, and reactive to light. Conjunctivae and EOM are normal.  Neck: Normal range of motion. Neck supple.  Cardiovascular: Normal rate, regular rhythm, normal heart sounds and intact distal pulses.   Pulmonary/Chest: Effort normal and breath sounds normal.  Abdominal: Soft. Bowel sounds are normal.  Neurological: She is alert and oriented to person, place, and time. She has normal reflexes.  Skin: Skin is warm and dry. No rash noted.  Psychiatric: She has a normal mood and affect. Her behavior is normal. Judgment and thought content normal.  Nursing note and vitals reviewed.   Results for orders placed or performed in visit on 05/23/17  Bayer DCA Hb A1c Waived  Result Value Ref Range   Bayer DCA Hb A1c Waived 7.3 (H) <7.0 %  CBC with Differential/Platelet  Result Value Ref Range   WBC 9.1 3.4 - 10.8 x10E3/uL   RBC 4.26 3.77 - 5.28 x10E6/uL   Hemoglobin 12.5 11.1 - 15.9 g/dL   Hematocrit 39.0 34.0 - 46.6 %   MCV 92 79 - 97 fL   MCH 29.3 26.6 - 33.0 pg   MCHC 32.1 31.5 - 35.7 g/dL   RDW 14.8 12.3 - 15.4 %   Platelets 400 (H) 150 - 379 x10E3/uL   Neutrophils 69 Not Estab. %   Lymphs 22 Not Estab. %   Monocytes 8 Not Estab. %   Eos 1 Not Estab. %   Basos 0 Not Estab. %   Neutrophils Absolute 6.2 1.4 - 7.0 x10E3/uL   Lymphocytes Absolute 2.0 0.7 - 3.1 x10E3/uL   Monocytes Absolute 0.7 0.1 - 0.9 x10E3/uL   EOS (ABSOLUTE) 0.1 0.0 - 0.4 x10E3/uL   Basophils Absolute 0.0 0.0 - 0.2 x10E3/uL   Immature Granulocytes 0 Not Estab. %   Immature Grans (Abs) 0.0 0.0 - 0.1 x10E3/uL  CMP14+EGFR  Result Value Ref Range   Glucose 105 (H)  65 - 99 mg/dL   BUN 18 8 - 27 mg/dL   Creatinine, Ser 0.76 0.57 - 1.00 mg/dL   GFR calc non Af Amer 78 >59 mL/min/1.73   GFR calc Af Amer 89 >59 mL/min/1.73   BUN/Creatinine Ratio 24 12 - 28   Sodium 140 134 - 144 mmol/L   Potassium 4.6 3.5 - 5.2 mmol/L   Chloride 101 96 - 106 mmol/L   CO2 22 20 - 29 mmol/L   Calcium 9.7 8.7 - 10.3 mg/dL   Total Protein 6.7 6.0 - 8.5 g/dL   Albumin 4.2 3.5 - 4.8 g/dL   Globulin, Total 2.5 1.5 - 4.5 g/dL   Albumin/Globulin Ratio 1.7 1.2 - 2.2   Bilirubin Total 0.4 0.0 - 1.2 mg/dL   Alkaline Phosphatase 75 39 - 117 IU/L   AST 18 0 - 40 IU/L   ALT 14 0 - 32 IU/L  Lipid panel  Result Value Ref Range   Cholesterol, Total 132 100 - 199 mg/dL   Triglycerides 103 0 - 149 mg/dL   HDL 47 >39 mg/dL   VLDL Cholesterol Cal 21 5 - 40 mg/dL   LDL Calculated 64 0 - 99 mg/dL   Chol/HDL Ratio 2.8 0.0 - 4.4 ratio      Assessment & Plan:   1. Blood glucose elevated - Bayer DCA Hb A1c Waived  2. Type 2 diabetes mellitus without complication, with long-term current use of insulin (HCC) - insulin NPH Human (HUMULIN N) 100 UNIT/ML injection; Inject 0.6 mLs (60 Units total) into the skin 2 (two) times daily.  Dispense: 30 mL; Refill: 11 - insulin regular (HUMULIN R) 100 units/mL injection; Inject 0.1-0.2 mLs (10-20 Units total) into the skin 3 (three) times daily before meals. 10-20 units before meals  Dispense: 60 mL; Refill: 5 - CBC with Differential/Platelet - CMP14+EGFR - Lipid panel  3. Vertigo - meclizine (ANTIVERT) 25 MG tablet; Take 1 tablet (25 mg total) by mouth 3 (three) times daily as needed for dizziness.  Dispense: 30 tablet; Refill: 5  4. Carpal tunnel syndrome of left wrist    Current Outpatient Prescriptions:  .  aspirin 81 MG EC tablet, Take 1 tablet (81 mg total) by mouth daily., Disp: , Rfl:  .  B-D INS SYRINGE 0.5CC/30GX1/2" 30G X 1/2" 0.5 ML MISC, USE 1 NEEDLE UP TO 5 TIMES DAILY, Disp: 300 each, Rfl: 7 .  Cholecalciferol (VITAMIN D3)  5000 UNITS TABS, Take 5,000 Units by mouth daily., Disp: , Rfl:  .  citalopram (CELEXA) 20 MG tablet, TAKE 1 TABLET (20 MG TOTAL) BY MOUTH DAILY., Disp: , Rfl: 3 .  diltiazem (CARDIZEM CD) 240 MG 24 hr capsule, TAKE ONE CAPSULE BY MOUTH ON AN EMPTY STOMACH IN THE MORNING, Disp: 90 capsule, Rfl: 1 .  gabapentin (NEURONTIN) 400 MG capsule, Take 1 capsule (400 mg total) by mouth at bedtime. Take 1-3 capsules at bedtime, Disp: 90 capsule, Rfl: 11 .  ibuprofen (ADVIL,MOTRIN) 200 MG tablet, Take 200 mg by mouth every 6 (six) hours as needed., Disp: , Rfl:  .  insulin NPH Human (HUMULIN N) 100 UNIT/ML injection, Inject 0.6 mLs (60 Units total) into the skin 2 (two) times daily., Disp: 30 mL, Rfl: 11 .  insulin regular (HUMULIN R) 100 units/mL injection, Inject 0.1-0.2 mLs (10-20 Units total) into the skin 3 (three) times daily before meals. 10-20 units before meals, Disp: 60 mL, Rfl: 5 .  loratadine (CLARITIN) 10 MG tablet, Take 1 tablet (10 mg total) by mouth daily., Disp: 90 tablet, Rfl: 3 .  losartan-hydrochlorothiazide (HYZAAR) 100-25 MG tablet, Take 1 tablet by mouth daily., Disp: 90 tablet, Rfl: 3 .  metFORMIN (GLUCOPHAGE) 1000 MG tablet, Take 1 tablet (1,000 mg total) by mouth 2 (two) times daily with a meal., Disp: 180 tablet, Rfl: 3 .  ONE TOUCH ULTRA TEST test strip, TEST TWICE DAILY IN VITRO 90 DAYS, Disp: 100 each, Rfl: 2 .  simvastatin (ZOCOR) 40 MG tablet, Take 1 tablet (40 mg total) by mouth daily., Disp: 90 tablet, Rfl: 3 .  meclizine (ANTIVERT) 25 MG tablet, Take 1 tablet (25 mg total) by mouth 3 (three) times daily as needed for dizziness., Disp: 30 tablet, Rfl: 5 Continue all other maintenance medications as listed above.  Follow up plan: Return in about 3 months (around 08/22/2017) for recheck.  Educational handout given for [place] PA-C [place] [address]  [place], [place] [address] [phone]   05/26/2017, 10:05 PM

## 2017-05-28 ENCOUNTER — Encounter: Payer: Self-pay | Admitting: *Deleted

## 2017-06-10 ENCOUNTER — Ambulatory Visit (INDEPENDENT_AMBULATORY_CARE_PROVIDER_SITE_OTHER): Payer: Medicare Other | Admitting: Physician Assistant

## 2017-06-10 ENCOUNTER — Encounter: Payer: Self-pay | Admitting: Physician Assistant

## 2017-06-10 VITALS — BP 146/77 | HR 91 | Temp 97.2°F | Ht 67.0 in | Wt 216.6 lb

## 2017-06-10 DIAGNOSIS — M25561 Pain in right knee: Secondary | ICD-10-CM | POA: Diagnosis not present

## 2017-06-10 DIAGNOSIS — E108 Type 1 diabetes mellitus with unspecified complications: Secondary | ICD-10-CM | POA: Diagnosis not present

## 2017-06-10 MED ORDER — MELOXICAM 7.5 MG PO TABS: 7.5000 mg | ORAL_TABLET | Freq: Every day | ORAL | 0 refills | Status: DC

## 2017-06-10 MED ORDER — FREESTYLE SYSTEM KIT: 1.0000 | PACK | 12 refills | Status: DC | PRN

## 2017-06-10 MED ORDER — METHYLPREDNISOLONE ACETATE 80 MG/ML IJ SUSP
80.0000 mg | Freq: Once | INTRAMUSCULAR | Status: AC
  Administered 2017-06-10: 80 mg via INTRAMUSCULAR

## 2017-06-10 NOTE — Patient Instructions (Signed)

## 2017-06-10 NOTE — Progress Notes (Signed)
BP (!) 146/77   Pulse 91   Temp (!) 97.2 F (36.2 C) (Oral)   Ht '5\' 7"'$  (1.702 m)   Wt 216 lb 9.6 oz (98.2 kg)   SpO2 97%   BMI 33.92 kg/m    Subjective:    Patient ID: Adrienne Cook, female    DOB: 07-Feb-1941, 76 y.o.   MRN: [id number]  HPI: Adrienne Cook is a 76 y.o. female presenting on 06/10/2017 for Right knee pain & swelling (patient states "hurts most when getting up or down from sitting to standing and back down to sitting", when walking hurts some but bareable. Occurence last 2 weeks )  Patient had an episode where she woke up in the morning with severe right knee pain and swelling. She does not recall any injury. It has persisted and swelling. 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 She denies any redness or warmth to her knee. The pain is primarily on the inner part of her knee. It hurts when changing positions some sitting to standing and using stairs. She denies any fever or chills. Ex  Patient has also talked about the continuous glucose monitor. We will print prescription for her to take to her pharmacy. She reports that her insurance has told her she will pay 20% of the cost. She is not sure what that will be.  Relevant past medical, surgical, family and social history reviewed and updated as indicated. Allergies and medications reviewed and updated.  Past Medical History:  Diagnosis Date  . 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 Chest pain    a. normal cath in 2004;  b. nl stress echo 2011;  c. 02/2012 Cath: nl cors, EF 65%.  . DM (diabetes mellitus) ([place])    insulin dependent  . HTN (hypertension)   . Hypercholesterolemia   . Irregular heartbeat    a. normal event monitor  . Obesity   . PONV (postoperative nausea and vomiting)   . Seasonal allergies   . Sleep apnea    does not tolerate CPAP    Past Surgical History:  Procedure Laterality Date  . APPENDECTOMY    . BREAST CYST EXCISION     benign  . CARPAL TUNNEL RELEASE    . CHOLECYSTECTOMY    .  COLONOSCOPY  02/13/2012  . EXAMINATION UNDER ANESTHESIA     and sphincterotomy  . HAMMER TOE SURGERY     bilateral; 5th toes  . LEFT HEART CATH  02/28/2012   Procedure: LEFT HEART CATH;  Surgeon: Hillary Bow, MD;  Location: [facility] CATH LAB;  Service: Cardiovascular;;  . LEFT HEART CATHETERIZATION WITH CORONARY ANGIOGRAM N/A 02/28/2012   Procedure: LEFT HEART CATHETERIZATION WITH CORONARY ANGIOGRAM;  Surgeon: Hillary Bow, MD;  Location[facility] CATH LAB;  Service: Cardiovascular;  Laterality: N/A;  . VAGINAL HYSTERECTOMY  1978    Review of Systems  Constitutional: Negative.  Negative for activity change, fatigue and fever.  HENT: Negative.   Eyes: Negative.   Respiratory: Negative.  Negative for cough.   Cardiovascular: Negative.  Negative for chest pain.  Gastrointestinal: Negative.  Negative for abdominal pain.  Endocrine: Negative.   Genitourinary: Negative.  Negative for dysuria.  Musculoskeletal: Positive for arthralgias, gait problem and joint swelling.  Skin: Negative.     Allergies as of 06/10/2017   No Known Allergies     Medication List       Accurate as of 06/10/17  2:45 PM. Always  use your most recent med list.          aspirin 81 MG EC tablet Take 1 tablet (81 mg total) by mouth daily.   B-D INS SYRINGE 0.5CC/30GX1/2" 30G X 1/2" 0.5 ML Misc Generic drug:  Insulin Syringe-Needle U-100 USE 1 NEEDLE UP TO 5 TIMES DAILY   citalopram 20 MG tablet Commonly known as:  CELEXA TAKE 1 TABLET (20 MG TOTAL) BY MOUTH DAILY.   diltiazem 240 MG 24 hr capsule Commonly known as:  CARDIZEM CD TAKE ONE CAPSULE BY MOUTH ON AN EMPTY STOMACH IN THE MORNING   gabapentin 400 MG capsule Commonly known as:  NEURONTIN Take 1 capsule (400 mg total) by mouth at bedtime. Take 1-3 capsules at bedtime   glucose monitoring kit monitoring kit 1 each by Does not apply route as needed for other. LIBRE continuous glucose monitor Reader and sensor pads for one month   ibuprofen 200 MG  tablet Commonly known as:  ADVIL,MOTRIN Take 200 mg by mouth every 6 (six) hours as needed.   insulin NPH Human 100 UNIT/ML injection Commonly known as:  HUMULIN N Inject 0.6 mLs (60 Units total) into the skin 2 (two) times daily.   insulin regular 100 units/mL injection Commonly known as:  HUMULIN R Inject 0.1-0.2 mLs (10-20 Units total) into the skin 3 (three) times daily before meals. 10-20 units before meals   loratadine 10 MG tablet Commonly known as:  CLARITIN Take 1 tablet (10 mg total) by mouth daily.   losartan-hydrochlorothiazide 100-25 MG tablet Commonly known as:  HYZAAR Take 1 tablet by mouth daily.   meclizine 25 MG tablet Commonly known as:  ANTIVERT Take 1 tablet (25 mg total) by mouth 3 (three) times daily as needed for dizziness.   meloxicam 7.5 MG tablet Commonly known as:  MOBIC Take 1 tablet (7.5 mg total) by mouth daily.   metFORMIN 1000 MG tablet Commonly known as:  GLUCOPHAGE Take 1 tablet (1,000 mg total) by mouth 2 (two) times daily with a meal.   ONE TOUCH ULTRA TEST test strip Generic drug:  glucose blood TEST TWICE DAILY IN VITRO 90 DAYS   simvastatin 40 MG tablet Commonly known as:  ZOCOR Take 1 tablet (40 mg total) by mouth daily.   Vitamin D3 5000 units Tabs Take 5,000 Units by mouth daily.            Discharge Care Instructions        Start     Ordered   06/10/17 1445  METHYLPREDNISOLONE ACETATE 80 MG/ML IJ SUSP   Once     06/10/17 1434   06/10/17 0000  meloxicam (MOBIC) 7.5 MG tablet  Daily    Question:  Supervising Provider  Answer:  Timmothy Euler   06/10/17 1434   06/10/17 0000  glucose monitoring kit (FREESTYLE) monitoring kit  As needed    Question:  Supervising Provider  Answer:  Timmothy Euler   06/10/17 1442         Objective:    BP (!) 146/77   Pulse 91   Temp (!) 97.2 F (36.2 C) (Oral)   Ht '5\' 7"'$  (1.702 m)   Wt 216 lb 9.6 oz (98.2 kg)   SpO2 97%   BMI 33.92 kg/m   No Known  Allergies  Physical Exam  Constitutional: She is oriented to person, place, and time. She appears well-developed and well-nourished.  HENT:  Head: Normocephalic and atraumatic.  Eyes: Pupils are equal, round, and reactive  to light. Conjunctivae and EOM are normal.  Cardiovascular: Normal rate, regular rhythm, normal heart sounds and intact distal pulses.   Pulmonary/Chest: Effort normal and breath sounds normal.  Abdominal: Soft. Bowel sounds are normal.  Musculoskeletal:       Right knee: She exhibits decreased range of motion and swelling. She exhibits no effusion, no ecchymosis, no deformity, no laceration, no erythema, normal alignment, no LCL laxity and normal patellar mobility. Tenderness found. Medial joint line tenderness noted.       Legs: Neurological: She is alert and oriented to person, place, and time. She has normal reflexes.  Skin: Skin is warm and dry. No rash noted.  Psychiatric: She has a normal mood and affect. Her behavior is normal. Judgment and thought content normal.    Results for orders placed or performed in visit on 05/23/17  Bayer DCA Hb A1c Waived  Result Value Ref Range   Bayer DCA Hb A1c Waived 7.3 (H) <7.0 %  CBC with Differential/Platelet  Result Value Ref Range   WBC 9.1 3.4 - 10.8 x10E3/uL   RBC 4.26 3.77 - 5.28 x10E6/uL   Hemoglobin 12.5 11.1 - 15.9 g/dL   Hematocrit 39.0 34.0 - 46.6 %   MCV 92 79 - 97 fL   MCH 29.3 26.6 - 33.0 pg   MCHC 32.1 31.5 - 35.7 g/dL   RDW 14.8 12.3 - 15.4 %   Platelets 400 (H) 150 - 379 x10E3/uL   Neutrophils 69 Not Estab. %   Lymphs 22 Not Estab. %   Monocytes 8 Not Estab. %   Eos 1 Not Estab. %   Basos 0 Not Estab. %   Neutrophils Absolute 6.2 1.4 - 7.0 x10E3/uL   Lymphocytes Absolute 2.0 0.7 - 3.1 x10E3/uL   Monocytes Absolute 0.7 0.1 - 0.9 x10E3/uL   EOS (ABSOLUTE) 0.1 0.0 - 0.4 x10E3/uL   Basophils Absolute 0.0 0.0 - 0.2 x10E3/uL   Immature Granulocytes 0 Not Estab. %   Immature Grans (Abs) 0.0 0.0 - 0.1  x10E3/uL  CMP14+EGFR  Result Value Ref Range   Glucose 105 (H) 65 - 99 mg/dL   BUN 18 8 - 27 mg/dL   Creatinine, Ser 0.76 0.57 - 1.00 mg/dL   GFR calc non Af Amer 78 >59 mL/min/1.73   GFR calc Af Amer 89 >59 mL/min/1.73   BUN/Creatinine Ratio 24 12 - 28   Sodium 140 134 - 144 mmol/L   Potassium 4.6 3.5 - 5.2 mmol/L   Chloride 101 96 - 106 mmol/L   CO2 22 20 - 29 mmol/L   Calcium 9.7 8.7 - 10.3 mg/dL   Total Protein 6.7 6.0 - 8.5 g/dL   Albumin 4.2 3.5 - 4.8 g/dL   Globulin, Total 2.5 1.5 - 4.5 g/dL   Albumin/Globulin Ratio 1.7 1.2 - 2.2   Bilirubin Total 0.4 0.0 - 1.2 mg/dL   Alkaline Phosphatase 75 39 - 117 IU/L   AST 18 0 - 40 IU/L   ALT 14 0 - 32 IU/L  Lipid panel  Result Value Ref Range   Cholesterol, Total 132 100 - 199 mg/dL   Triglycerides 103 0 - 149 mg/dL   HDL 47 >39 mg/dL   VLDL Cholesterol Cal 21 5 - 40 mg/dL   LDL Calculated 64 0 - 99 mg/dL   Chol/HDL Ratio 2.8 0.0 - 4.4 ratio      Assessment & Plan:   1. Medial knee pain, right - methylPREDNISolone acetate (DEPO-MEDROL) injection 80 mg; Inject 1 mL (  80 mg total) into the muscle once. - meloxicam (MOBIC) 7.5 MG tablet; Take 1 tablet (7.5 mg total) by mouth daily.  Dispense: 30 tablet; Refill: 0  2. Type 1 diabetes mellitus with complications (HCC) - glucose monitoring kit (FREESTYLE) monitoring kit; 1 each by Does not apply route as needed for other. LIBRE continuous glucose monitor Reader and sensor pads for one month  Dispense: 1 each; Refill: 12    Current Outpatient Prescriptions:  .  aspirin 81 MG EC tablet, Take 1 tablet (81 mg total) by mouth daily., Disp: , Rfl:  .  B-D INS SYRINGE 0.5CC/30GX1/2" 30G X 1/2" 0.5 ML MISC, USE 1 NEEDLE UP TO 5 TIMES DAILY, Disp: 300 each, Rfl: 7 .  Cholecalciferol (VITAMIN D3) 5000 UNITS TABS, Take 5,000 Units by mouth daily., Disp: , Rfl:  .  citalopram (CELEXA) 20 MG tablet, TAKE 1 TABLET (20 MG TOTAL) BY MOUTH DAILY., Disp: , Rfl: 3 .  diltiazem (CARDIZEM CD) 240  MG 24 hr capsule, TAKE ONE CAPSULE BY MOUTH ON AN EMPTY STOMACH IN THE MORNING, Disp: 90 capsule, Rfl: 1 .  gabapentin (NEURONTIN) 400 MG capsule, Take 1 capsule (400 mg total) by mouth at bedtime. Take 1-3 capsules at bedtime, Disp: 90 capsule, Rfl: 11 .  ibuprofen (ADVIL,MOTRIN) 200 MG tablet, Take 200 mg by mouth every 6 (six) hours as needed., Disp: , Rfl:  .  insulin NPH Human (HUMULIN N) 100 UNIT/ML injection, Inject 0.6 mLs (60 Units total) into the skin 2 (two) times daily., Disp: 30 mL, Rfl: 11 .  insulin regular (HUMULIN R) 100 units/mL injection, Inject 0.1-0.2 mLs (10-20 Units total) into the skin 3 (three) times daily before meals. 10-20 units before meals, Disp: 60 mL, Rfl: 5 .  loratadine (CLARITIN) 10 MG tablet, Take 1 tablet (10 mg total) by mouth daily., Disp: 90 tablet, Rfl: 3 .  losartan-hydrochlorothiazide (HYZAAR) 100-25 MG tablet, Take 1 tablet by mouth daily., Disp: 90 tablet, Rfl: 3 .  meclizine (ANTIVERT) 25 MG tablet, Take 1 tablet (25 mg total) by mouth 3 (three) times daily as needed for dizziness., Disp: 30 tablet, Rfl: 5 .  metFORMIN (GLUCOPHAGE) 1000 MG tablet, Take 1 tablet (1,000 mg total) by mouth 2 (two) times daily with a meal., Disp: 180 tablet, Rfl: 3 .  ONE TOUCH ULTRA TEST test strip, TEST TWICE DAILY IN VITRO 90 DAYS, Disp: 100 each, Rfl: 2 .  simvastatin (ZOCOR) 40 MG tablet, Take 1 tablet (40 mg total) by mouth daily., Disp: 90 tablet, Rfl: 3 .  glucose monitoring kit (FREESTYLE) monitoring kit, 1 each by Does not apply route as needed for other. LIBRE continuous glucose monitor Reader and sensor pads for one month, Disp: 1 each, Rfl: 12 .  meloxicam (MOBIC) 7.5 MG tablet, Take 1 tablet (7.5 mg total) by mouth daily., Disp: 30 tablet, Rfl: 0  Current Facility-Administered Medications:  .  methylPREDNISolone acetate (DEPO-MEDROL) injection 80 mg, 80 mg, Intramuscular, Once, Soloman Mckeithan S, PA-C Continue all other maintenance medications as listed  above.  Follow up plan: Follow-up as needed or worsening of symptoms. Call office for any issues.   Educational handout given for knee pain  Terald Sleeper PA-C [facility] [address]  [place], [place] [address] [phone]   06/10/2017, 2:45 PM

## 2017-07-07 ENCOUNTER — Encounter: Payer: Self-pay | Admitting: Physician Assistant

## 2017-07-07 ENCOUNTER — Ambulatory Visit (INDEPENDENT_AMBULATORY_CARE_PROVIDER_SITE_OTHER): Payer: Medicare Other | Admitting: Physician Assistant

## 2017-07-07 VITALS — BP 136/70 | HR 88 | Temp 97.5°F | Wt 215.0 lb

## 2017-07-07 DIAGNOSIS — Z23 Encounter for immunization: Secondary | ICD-10-CM

## 2017-07-07 DIAGNOSIS — G8929 Other chronic pain: Secondary | ICD-10-CM | POA: Diagnosis not present

## 2017-07-07 DIAGNOSIS — M25561 Pain in right knee: Secondary | ICD-10-CM

## 2017-07-07 NOTE — Patient Instructions (Signed)
In a few days you may receive a survey in the mail or online from [employer] regarding your visit with us today. Please take a moment to fill this out. Your feedback is very important to our whole office. It can help us better understand your needs as well as improve your experience and satisfaction. Thank you for taking your time to complete it. We care about you.  Brayleigh Rybacki, PA-C  

## 2017-07-07 NOTE — Progress Notes (Signed)
BP 136/70   Pulse 88   Temp (!) 97.5 F (36.4 C) (Oral)   Wt 215 lb (97.5 kg)   BMI 33.67 kg/m    Subjective:    Patient ID: Adrienne Cook, female    DOB: 1940-09-22, 76 y.o.   MRN: [id number]  HPI: Adrienne Cook is a 76 y.o. female presenting on 07/07/2017 for right knee pain with swelling  Approximately 2 months ago the patient had an episode with this in the.  It was swollen and hurting.  We did a Depo-Medrol shot here in the office and Mobic for 10 days at home.  She states it did get better for a brief amount of time.  In recent weeks it has been swelling greatly.  It is very bad at the end of the day.  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  She has not had any new injury to this knee.  She has not been to an orthopedist for this.  Relevant past medical, surgical, family and social history reviewed and updated as indicated. Allergies and medications reviewed and updated.  Past Medical History:  Diagnosis Date  . 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 Chest pain    a. normal cath in 2004;  b. nl stress echo 2011;  c. 02/2012 Cath: nl cors, EF 65%.  . DM (diabetes mellitus) ([place])    insulin dependent  . HTN (hypertension)   . Hypercholesterolemia   . Irregular heartbeat    a. normal event monitor  . Obesity   . PONV (postoperative nausea and vomiting)   . Seasonal allergies   . Sleep apnea    does not tolerate CPAP    Past Surgical History:  Procedure Laterality Date  . APPENDECTOMY    . BREAST CYST EXCISION     benign  . CARPAL TUNNEL RELEASE    . CHOLECYSTECTOMY    . COLONOSCOPY  02/13/2012  . EXAMINATION UNDER ANESTHESIA     and sphincterotomy  . HAMMER TOE SURGERY     bilateral; 5th toes  . LEFT HEART CATH  02/28/2012   Procedure: LEFT HEART CATH;  Surgeon: Hillary Bow, MD;  Location: [facility] CATH LAB;  Service: Cardiovascular;;  . LEFT HEART CATHETERIZATION WITH CORONARY ANGIOGRAM N/A 02/28/2012   Procedure: LEFT HEART CATHETERIZATION  WITH CORONARY ANGIOGRAM;  Surgeon: Hillary Bow, MD;  Location: [facility] CATH LAB;  Service: Cardiovascular;  Laterality: N/A;  . VAGINAL HYSTERECTOMY  1978    Review of Systems  Constitutional: Negative.   HENT: Negative.   Eyes: Negative.   Respiratory: Negative.   Gastrointestinal: Negative.   Genitourinary: Negative.   Musculoskeletal: Positive for arthralgias, gait problem and joint swelling.    Allergies as of 07/07/2017   No Known Allergies     Medication List       Accurate as of 07/07/17 10:54 AM. Always use your most recent med list.          aspirin 81 MG EC tablet Take 1 tablet (81 mg total) by mouth daily.   B-D INS SYRINGE 0.5CC/30GX1/2" 30G X 1/2" 0.5 ML Misc Generic drug:  Insulin Syringe-Needle U-100 USE 1 NEEDLE UP TO 5 TIMES DAILY   citalopram 20 MG tablet Commonly known as:  CELEXA TAKE 1 TABLET (20 MG TOTAL) BY MOUTH DAILY.   diltiazem 240 MG 24 hr capsule Commonly known as:  CARDIZEM CD TAKE ONE CAPSULE BY MOUTH ON  AN EMPTY STOMACH IN THE MORNING   gabapentin 400 MG capsule Commonly known as:  NEURONTIN Take 1 capsule (400 mg total) by mouth at bedtime. Take 1-3 capsules at bedtime   glucose monitoring kit monitoring kit 1 each by Does not apply route as needed for other. LIBRE continuous glucose monitor Reader and sensor pads for one month   ibuprofen 200 MG tablet Commonly known as:  ADVIL,MOTRIN Take 200 mg by mouth every 6 (six) hours as needed.   insulin NPH Human 100 UNIT/ML injection Commonly known as:  HUMULIN N Inject 0.6 mLs (60 Units total) into the skin 2 (two) times daily.   insulin regular 100 units/mL injection Commonly known as:  HUMULIN R Inject 0.1-0.2 mLs (10-20 Units total) into the skin 3 (three) times daily before meals. 10-20 units before meals   loratadine 10 MG tablet Commonly known as:  CLARITIN Take 1 tablet (10 mg total) by mouth daily.   losartan-hydrochlorothiazide 100-25 MG tablet Commonly known as:   HYZAAR Take 1 tablet by mouth daily.   meclizine 25 MG tablet Commonly known as:  ANTIVERT Take 1 tablet (25 mg total) by mouth 3 (three) times daily as needed for dizziness.   meloxicam 7.5 MG tablet Commonly known as:  MOBIC Take 1 tablet (7.5 mg total) by mouth daily.   metFORMIN 1000 MG tablet Commonly known as:  GLUCOPHAGE Take 1 tablet (1,000 mg total) by mouth 2 (two) times daily with a meal.   ONE TOUCH ULTRA TEST test strip Generic drug:  glucose blood TEST TWICE DAILY IN VITRO 90 DAYS   simvastatin 40 MG tablet Commonly known as:  ZOCOR Take 1 tablet (40 mg total) by mouth daily.   Vitamin D3 5000 units Tabs Take 5,000 Units by mouth daily.          Objective:    BP 136/70   Pulse 88   Temp (!) 97.5 F (36.4 C) (Oral)   Wt 215 lb (97.5 kg)   BMI 33.67 kg/m   No Known Allergies  Physical Exam  Constitutional: She is oriented to person, place, and time. She appears well-developed and well-nourished.  HENT:  Head: Normocephalic and atraumatic.  Eyes: Pupils are equal, round, and reactive to light. Conjunctivae and EOM are normal.  Cardiovascular: Normal rate, regular rhythm, normal heart sounds and intact distal pulses.   Pulmonary/Chest: Effort normal and breath sounds normal.  Abdominal: Soft. Bowel sounds are normal.  Musculoskeletal:       Right knee: She exhibits decreased range of motion and swelling. She exhibits no deformity, no erythema, no LCL laxity, normal patellar mobility and no MCL laxity. Tenderness found. Medial joint line tenderness noted.       Legs: Neurological: She is alert and oriented to person, place, and time. She has normal reflexes.  Skin: Skin is warm and dry. No rash noted.  Psychiatric: She has a normal mood and affect. Her behavior is normal. Judgment and thought content normal.        Assessment & Plan:   1. Encounter for immunization - Flu vaccine HIGH DOSE PF - Ambulatory referral to Orthopedic Surgery  2.  Chronic pain of right knee - Ambulatory referral to Orthopedic Surgery    Current Outpatient Prescriptions:  .  aspirin 81 MG EC tablet, Take 1 tablet (81 mg total) by mouth daily., Disp: , Rfl:  .  B-D INS SYRINGE 0.5CC/30GX1/2" 30G X 1/2" 0.5 ML MISC, USE 1 NEEDLE UP TO 5 TIMES DAILY,  Disp: 300 each, Rfl: 7 .  Cholecalciferol (VITAMIN D3) 5000 UNITS TABS, Take 5,000 Units by mouth daily., Disp: , Rfl:  .  citalopram (CELEXA) 20 MG tablet, TAKE 1 TABLET (20 MG TOTAL) BY MOUTH DAILY., Disp: , Rfl: 3 .  diltiazem (CARDIZEM CD) 240 MG 24 hr capsule, TAKE ONE CAPSULE BY MOUTH ON AN EMPTY STOMACH IN THE MORNING, Disp: 90 capsule, Rfl: 1 .  gabapentin (NEURONTIN) 400 MG capsule, Take 1 capsule (400 mg total) by mouth at bedtime. Take 1-3 capsules at bedtime, Disp: 90 capsule, Rfl: 11 .  glucose monitoring kit (FREESTYLE) monitoring kit, 1 each by Does not apply route as needed for other. LIBRE continuous glucose monitor Reader and sensor pads for one month, Disp: 1 each, Rfl: 12 .  ibuprofen (ADVIL,MOTRIN) 200 MG tablet, Take 200 mg by mouth every 6 (six) hours as needed., Disp: , Rfl:  .  insulin NPH Human (HUMULIN N) 100 UNIT/ML injection, Inject 0.6 mLs (60 Units total) into the skin 2 (two) times daily., Disp: 30 mL, Rfl: 11 .  insulin regular (HUMULIN R) 100 units/mL injection, Inject 0.1-0.2 mLs (10-20 Units total) into the skin 3 (three) times daily before meals. 10-20 units before meals, Disp: 60 mL, Rfl: 5 .  loratadine (CLARITIN) 10 MG tablet, Take 1 tablet (10 mg total) by mouth daily., Disp: 90 tablet, Rfl: 3 .  losartan-hydrochlorothiazide (HYZAAR) 100-25 MG tablet, Take 1 tablet by mouth daily., Disp: 90 tablet, Rfl: 3 .  meclizine (ANTIVERT) 25 MG tablet, Take 1 tablet (25 mg total) by mouth 3 (three) times daily as needed for dizziness., Disp: 30 tablet, Rfl: 5 .  meloxicam (MOBIC) 7.5 MG tablet, Take 1 tablet (7.5 mg total) by mouth daily., Disp: 30 tablet, Rfl: 0 .  metFORMIN  (GLUCOPHAGE) 1000 MG tablet, Take 1 tablet (1,000 mg total) by mouth 2 (two) times daily with a meal., Disp: 180 tablet, Rfl: 3 .  ONE TOUCH ULTRA TEST test strip, TEST TWICE DAILY IN VITRO 90 DAYS, Disp: 100 each, Rfl: 2 .  simvastatin (ZOCOR) 40 MG tablet, Take 1 tablet (40 mg total) by mouth daily., Disp: 90 tablet, Rfl: 3 Continue all other maintenance medications as listed above.  Follow up plan: Return if symptoms worsen or fail to improve.  Educational handout given for [place] PA-C [facility] [address]  [place], [place] [address] [phone]   07/07/2017, 10:54 AM

## 2017-07-10 ENCOUNTER — Encounter (INDEPENDENT_AMBULATORY_CARE_PROVIDER_SITE_OTHER): Payer: Self-pay | Admitting: Orthopaedic Surgery

## 2017-07-10 ENCOUNTER — Ambulatory Visit (INDEPENDENT_AMBULATORY_CARE_PROVIDER_SITE_OTHER): Payer: Medicare Other

## 2017-07-10 ENCOUNTER — Ambulatory Visit (INDEPENDENT_AMBULATORY_CARE_PROVIDER_SITE_OTHER): Payer: Medicare Other | Admitting: Orthopaedic Surgery

## 2017-07-10 VITALS — BP 137/70 | HR 82 | Ht 67.0 in | Wt 215.0 lb

## 2017-07-10 DIAGNOSIS — E11610 Type 2 diabetes mellitus with diabetic neuropathic arthropathy: Secondary | ICD-10-CM

## 2017-07-10 DIAGNOSIS — M25561 Pain in right knee: Secondary | ICD-10-CM | POA: Diagnosis not present

## 2017-07-10 DIAGNOSIS — Z794 Long term (current) use of insulin: Secondary | ICD-10-CM | POA: Diagnosis not present

## 2017-07-10 NOTE — Progress Notes (Signed)
Office Visit Note   Patient: Adrienne Cook           Date of Birth: Oct 10, 1940           MRN: [id number] Visit Date: 07/10/2017              Requested by: Terald Sleeper, PA-C [address] [place], [place] [address] PCP: Terald Sleeper, PA-C   Assessment & Plan: Visit Diagnoses:  1. Acute pain of right knee   2. Type 2 diabetes mellitus with diabetic neuropathic arthropathy, with long-term current use of insulin (HCC)     Plan: The aspirated 30 mL clear serous fluid with cartilage particulate. Fluid is not cloudy. We injected her knee with cortisone she'll watch her diabetes carefully and I will recheck her again in 4 weeks.  Follow-Up Instructions: Return in about 4 weeks (around 08/07/2017).   Orders:  Orders Placed This Encounter  Procedures  . XR KNEE 3 VIEW RIGHT   No orders of the defined types were placed in this encounter.     Procedures: No procedures performed   Clinical Data: No additional findings.   Subjective: Chief Complaint  Patient presents with  . Right Knee - Pain    HPI 76 year old female type II diabetic on insulin times greater than 10 years with problems with severe right knee pain and limp since the beginning of September. She's had trouble ambulating has a short stride gait has problems going up and down steps in her house since her onset of symptoms. She's had prominence in the popliteal region as well as anterior swelling. She denies fever chills no history of gout.She's tried meloxicam intermittent ice without relief. She has 21 steps to climb and since 05/26/2017 she's had significant problems with steps. No locking or giving way no fever or chills no history of gout or other arthropathy. She does have diabetic neuropathy.  Review of Systems positive for type 2 diabetes on insulin. She takes NovoLog twice a day and then regular insulin with meals. Past history of sleep apnea hypercholesterolemia increased BMI, carpal tunnel left wrist and  right knee severe knee pain. Positive for hypertension and high cholesterol.   Objective: Vital Signs: BP 137/70   Pulse 82   Ht 5\' 7"  (1.702 m)   Wt 215 lb (97.5 kg)   BMI 33.67 kg/m   Physical Exam  Constitutional: She is oriented to person, place, and time. She appears well-developed.  HENT:  Head: Normocephalic.  Right Ear: External ear normal.  Left Ear: External ear normal.  Eyes: Pupils are equal, round, and reactive to light.  Neck: No tracheal deviation present. No thyromegaly present.  Cardiovascular: Normal rate.   Pulmonary/Chest: Effort normal.  Abdominal: Soft.  Neurological: She is alert and oriented to person, place, and time.  Skin: Skin is warm and dry.  Psychiatric: She has a normal mood and affect. Her behavior is normal.    Ortho Exam patient had good hip range of motion. She has acute medial joint line tenderness crepitus with knee range of motion. She has 3+ synovitis. Some pain with patellar loading and quadriceps contracture. Collateral ligaments are stable anterior cruciate ligament PCL exam is normal distal pulses are intact she has decreased sensation consistent with peripheral neuropathy.  Specialty Comments:  No specialty comments available.  Imaging: Xr Knee 3 View Right  Result Date: 07/10/2017 Three-view x-rays right knee obtained and reviewed. This shows some medial joint line narrowing without spur formation. Slight osteopenia  of the distal femoral condyle. Calcifications in the quadriceps insertion site on the patella and some degenerative changes at the patellofemoral joint.    PMFS History: Patient Active Problem List   Diagnosis Date Noted  . Type 2 diabetes mellitus with diabetic neuropathic arthropathy ([place]) 07/10/2017  . Medial knee pain, right 06/10/2017  . Vertigo 05/23/2017  . Carpal tunnel syndrome of left wrist 05/23/2017  . 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 Renal insufficiency  03/16/2012  . Midsternal chest pain 02/28/2012  . Nausea & vomiting 02/28/2012  . Sleep apnea   . Obesity   . PALPITATIONS 04/25/2010  . HYPERCHOLESTEROLEMIA 04/23/2010  . Essential hypertension 04/23/2010  . FATIGUE 04/23/2010  . SHORTNESS OF BREATH 04/23/2010  . CHEST PAIN 04/23/2010   Past Medical History:  Diagnosis Date  . 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 Chest pain    a. normal cath in 2004;  b. nl stress echo 2011;  c. 02/2012 Cath: nl cors, EF 65%.  . DM (diabetes mellitus) ([place])    insulin dependent  . HTN (hypertension)   . Hypercholesterolemia   . Irregular heartbeat    a. normal event monitor  . Obesity   . PONV (postoperative nausea and vomiting)   . Seasonal allergies   . Sleep apnea    does not tolerate CPAP    Family History  Problem Relation Age of Onset  . Heart failure Mother        CHF   . Colon cancer Neg Hx   . Stomach cancer Neg Hx     Past Surgical History:  Procedure Laterality Date  . APPENDECTOMY    . BREAST CYST EXCISION     benign  . CARPAL TUNNEL RELEASE    . CHOLECYSTECTOMY    . COLONOSCOPY  02/13/2012  . EXAMINATION UNDER ANESTHESIA     and sphincterotomy  . HAMMER TOE SURGERY     bilateral; 5th toes  . LEFT HEART CATH  02/28/2012   Procedure: LEFT HEART CATH;  Surgeon: Hillary Bow, MD;  Location: [facility] CATH LAB;  Service: Cardiovascular;;  . LEFT HEART CATHETERIZATION WITH CORONARY ANGIOGRAM N/A 02/28/2012   Procedure: LEFT HEART CATHETERIZATION WITH CORONARY ANGIOGRAM;  Surgeon: Hillary Bow, MD;  Location: [facility] CATH LAB;  Service: Cardiovascular;  Laterality: N/A;  . VAGINAL HYSTERECTOMY  1978   Social History   Occupational History  . Not on file.   Social History Main Topics  . Smoking status: Never Smoker  . Smokeless tobacco: Never Used     Comment: tobacco use - no  . Alcohol use No  . Drug use: No  . Sexual activity: Not Currently    Birth control/ protection: Surgical     Comment: hyst

## 2017-07-17 ENCOUNTER — Telehealth: Payer: Self-pay | Admitting: Physician Assistant

## 2017-07-17 DIAGNOSIS — E108 Type 1 diabetes mellitus with unspecified complications: Secondary | ICD-10-CM

## 2017-07-21 MED ORDER — FREESTYLE LIBRE SENSOR SYSTEM MISC: 0 refills | Status: DC

## 2017-07-21 NOTE — Telephone Encounter (Signed)
Rx resent to pharmacy for patient.   

## 2017-07-31 ENCOUNTER — Ambulatory Visit (INDEPENDENT_AMBULATORY_CARE_PROVIDER_SITE_OTHER): Payer: Medicare Other | Admitting: Orthopaedic Surgery

## 2017-08-22 ENCOUNTER — Telehealth: Payer: Self-pay | Admitting: Physician Assistant

## 2017-08-22 ENCOUNTER — Other Ambulatory Visit: Payer: Self-pay | Admitting: *Deleted

## 2017-08-22 MED ORDER — "INSULIN SYRINGE 30G X 1/2"" 0.5 ML MISC": 1.0000 | Freq: Every day | 5 refills | Status: DC

## 2017-08-22 NOTE — Telephone Encounter (Signed)
What is the name of the medication? BD Ultra fine insulin syringes  Have you contacted your pharmacy to request a refill? YES  Which pharmacy would you like this sent to? [facility] in [place]   Patient notified that their request is being sent to the clinical staff for review and that they should receive a call once it is complete. If they do not receive a call within 24 hours they can check with their pharmacy or our office.

## 2017-08-22 NOTE — Telephone Encounter (Signed)
Fine to send in, I don't see any documentation of pt being dismissed?

## 2017-08-25 ENCOUNTER — Ambulatory Visit: Payer: Medicare Other | Admitting: Physician Assistant

## 2017-08-26 NOTE — Telephone Encounter (Signed)
She is under our care.  Can you find out what happened?  I am sending scripts in.

## 2017-08-27 NOTE — Telephone Encounter (Signed)
Patient aware.

## 2017-09-01 ENCOUNTER — Encounter: Payer: Self-pay | Admitting: Physician Assistant

## 2017-09-01 ENCOUNTER — Ambulatory Visit (INDEPENDENT_AMBULATORY_CARE_PROVIDER_SITE_OTHER): Payer: Medicare Other | Admitting: Physician Assistant

## 2017-09-01 VITALS — BP 128/73 | HR 91 | Temp 98.1°F | Ht 67.0 in | Wt 218.0 lb

## 2017-09-01 DIAGNOSIS — Z794 Long term (current) use of insulin: Secondary | ICD-10-CM | POA: Diagnosis not present

## 2017-09-01 DIAGNOSIS — I10 Essential (primary) hypertension: Secondary | ICD-10-CM

## 2017-09-01 DIAGNOSIS — E11610 Type 2 diabetes mellitus with diabetic neuropathic arthropathy: Secondary | ICD-10-CM

## 2017-09-01 DIAGNOSIS — M25512 Pain in left shoulder: Secondary | ICD-10-CM

## 2017-09-01 DIAGNOSIS — L309 Dermatitis, unspecified: Secondary | ICD-10-CM

## 2017-09-01 DIAGNOSIS — G8929 Other chronic pain: Secondary | ICD-10-CM | POA: Insufficient documentation

## 2017-09-01 LAB — BAYER DCA HB A1C WAIVED: HB A1C (BAYER DCA - WAIVED): 6.6 % (ref <7.0)

## 2017-09-01 MED ORDER — CLOBETASOL PROPIONATE 0.05 % EX CREA: 1.0000 "application " | TOPICAL_CREAM | Freq: Two times a day (BID) | CUTANEOUS | 0 refills | Status: DC

## 2017-09-01 NOTE — Patient Instructions (Signed)
In a few days you may receive a survey in the mail or online from [employer] regarding your visit with us today. Please take a moment to fill this out. Your feedback is very important to our whole office. It can help us better understand your needs as well as improve your experience and satisfaction. Thank you for taking your time to complete it. We care about you.  Ataya Murdy, PA-C  

## 2017-09-01 NOTE — Progress Notes (Signed)
BP 128/73   Pulse 91   Temp 98.1 F (36.7 C) (Oral)   Ht _0  (1.702 m)   Wt 218 lb (98.9 kg)   BMI 34.14 kg/m    Subjective:    Patient ID: Adrienne Cook, female    DOB: 1941/06/27, 76 y.o.   MRN: [id number]  HPI: Adrienne Cook is a 76 y.o. female presenting on 09/01/2017 for Follow-up (3 month ); Diabetes; and Hypertension  Patient comes in for 17-monthrecheck.  Overall she states she is feeling well.  She did get seen by orthopedics for her right knee.  There is positive synovitis and loss of degenerative changes.  They did discuss possible surgery in the future.  She is trying to hold off as long as possible.  She also complains of less pain.  It bothers her when lifting above her head and it wakes her up at night.  She does have some dryness on her feet that is white and scaling.  We have performed a foot exam today.  She has not had any issues with hypo-or hyperglycemia.  Her fasting blood sugars are typically in the 120s.  Relevant past medical, surgical, family and social history reviewed and updated as indicated. Allergies and medications reviewed and updated.  Past Medical History:  Diagnosis Date  . 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 Chest pain    a. normal cath in 2004;  b. nl stress echo 2011;  c. 02/2012 Cath: nl cors, EF 65%.  . DM (diabetes mellitus) ([place]    insulin dependent  . HTN (hypertension)   . Hypercholesterolemia   . Irregular heartbeat    a. normal event monitor  . Obesity   . PONV (postoperative nausea and vomiting)   . Seasonal allergies   . Sleep apnea    does not tolerate CPAP    Past Surgical History:  Procedure Laterality Date  . APPENDECTOMY    . BREAST CYST EXCISION     benign  . CARPAL TUNNEL RELEASE    . CHOLECYSTECTOMY    . COLONOSCOPY  02/13/2012  . EXAMINATION UNDER ANESTHESIA     and sphincterotomy  . HAMMER TOE SURGERY     bilateral; 5th toes  . LEFT HEART CATH  02/28/2012   Procedure: LEFT HEART CATH;  Surgeon: THillary Bow MD;  [facility];  Service: Cardiovascular;;  . LEFT HEART CATHETERIZATION WITH CORONARY ANGIOGRAM N/A 02/28/2012   Procedure: LEFT HEART CATHETERIZATION WITH CORONARY ANGIOGRAM;  Surgeon: THillary Bow MD;  [facility];  Service: Cardiovascular;  Laterality: N/A;  . VAGINAL HYSTERECTOMY  1978    Review of Systems  Constitutional: Negative.  Negative for activity change, fatigue and fever.  HENT: Negative.   Eyes: Negative.   Respiratory: Negative.  Negative for cough.   Cardiovascular: Negative.  Negative for chest pain.  Gastrointestinal: Negative.  Negative for abdominal pain.  Endocrine: Negative.   Genitourinary: Negative.  Negative for dysuria.  Musculoskeletal: Positive for arthralgias, joint swelling and myalgias.  Skin: Positive for rash.  Neurological: Negative.     Allergies as of 09/01/2017   No Known Allergies     Medication List        Accurate as of 09/01/17  8:51 AM. Always use your most recent med list.          aspirin 81 MG EC tablet Take 1 tablet (81 mg total) by mouth daily.   citalopram 20 MG  tablet Commonly known as:  CELEXA TAKE 1 TABLET (20 MG TOTAL) BY MOUTH DAILY.   clobetasol cream 0.05 % Commonly known as:  TEMOVATE Apply 1 application topically 2 (two) times daily.   diltiazem 240 MG 24 hr capsule Commonly known as:  CARDIZEM CD TAKE ONE CAPSULE BY MOUTH ON AN EMPTY STOMACH IN THE MORNING   gabapentin 400 MG capsule Commonly known as:  NEURONTIN Take 1 capsule (400 mg total) by mouth at bedtime. Take 1-3 capsules at bedtime   ibuprofen 200 MG tablet Commonly known as:  ADVIL,MOTRIN Take 200 mg by mouth every 6 (six) hours as needed.   insulin NPH Human 100 UNIT/ML injection Commonly known as:  HUMULIN N Inject 0.6 mLs (60 Units total) into the skin 2 (two) times daily.   insulin regular 100 units/mL injection Commonly known as:  HUMULIN R Inject 0.1-0.2 mLs (10-20 Units total) into the skin 3  (three) times daily before meals. 10-20 units before meals   INSULIN SYRINGE .5CC/30GX1/2" 30G X 1/2" 0.5 ML Misc Inject 1 each into the skin 5 (five) times daily.   loratadine 10 MG tablet Commonly known as:  CLARITIN Take 1 tablet (10 mg total) by mouth daily.   losartan-hydrochlorothiazide 100-25 MG tablet Commonly known as:  HYZAAR Take 1 tablet by mouth daily.   meclizine 25 MG tablet Commonly known as:  ANTIVERT Take 1 tablet (25 mg total) by mouth 3 (three) times daily as needed for dizziness.   metFORMIN 1000 MG tablet Commonly known as:  GLUCOPHAGE Take 1 tablet (1,000 mg total) by mouth 2 (two) times daily with a meal.   ONE TOUCH ULTRA TEST test strip Generic drug:  glucose blood TEST TWICE DAILY IN VITRO 90 DAYS   simvastatin 40 MG tablet Commonly known as:  ZOCOR Take 1 tablet (40 mg total) by mouth daily.   Vitamin D3 5000 units Tabs Take 5,000 Units by mouth daily.          Objective:    BP 128/73   Pulse 91   Temp 98.1 F (36.7 C) (Oral)   Ht _0  (1.702 m)   Wt 218 lb (98.9 kg)   BMI 34.14 kg/m   No Known Allergies  Physical Exam  Constitutional: She is oriented to person, place, and time. She appears well-developed and well-nourished.  HENT:  Head: Normocephalic and atraumatic.  Right Ear: Tympanic membrane, external ear and ear canal normal.  Left Ear: Tympanic membrane, external ear and ear canal normal.  Nose: Nose normal. No rhinorrhea.  Mouth/Throat: Oropharynx is clear and moist and mucous membranes are normal. No oropharyngeal exudate or posterior oropharyngeal erythema.  Eyes: Conjunctivae and EOM are normal. Pupils are equal, round, and reactive to light.  Neck: Normal range of motion. Neck supple.  Cardiovascular: Normal rate, regular rhythm, normal heart sounds and intact distal pulses.  Pulmonary/Chest: Effort normal and breath sounds normal.  Abdominal: Soft. Bowel sounds are normal.  Neurological: She is alert and oriented  to person, place, and time. She has normal reflexes.  Skin: Skin is warm and dry. Rash noted.  Psychiatric: She has a normal mood and affect. Her behavior is normal. Judgment and thought content normal.   Diabetic Foot Exam - Simple   Simple Foot Form Diabetic Foot exam was performed with the following findings:  Yes 09/01/2017  8:26 AM  Visual Inspection No deformities, no ulcerations, no other skin breakdown bilaterally:  Yes Sensation Testing Intact to touch and monofilament testing bilaterally:  Yes Pulse Check Posterior Tibialis and Dorsalis pulse intact bilaterally:  Yes Comments      Results for orders placed or performed in visit on 05/23/17  Bayer DCA Hb A1c Waived  Result Value Ref Range   Bayer DCA Hb A1c Waived 7.3 (H) <7.0 %  CBC with Differential/Platelet  Result Value Ref Range   WBC 9.1 3.4 - 10.8 x10E3/uL   RBC 4.26 3.77 - 5.28 x10E6/uL   Hemoglobin 12.5 11.1 - 15.9 g/dL   Hematocrit 39.0 34.0 - 46.6 %   MCV 92 79 - 97 fL   MCH 29.3 26.6 - 33.0 pg   MCHC 32.1 31.5 - 35.7 g/dL   RDW 14.8 12.3 - 15.4 %   Platelets 400 (H) 150 - 379 x10E3/uL   Neutrophils 69 Not Estab. %   Lymphs 22 Not Estab. %   Monocytes 8 Not Estab. %   Eos 1 Not Estab. %   Basos 0 Not Estab. %   Neutrophils Absolute 6.2 1.4 - 7.0 x10E3/uL   Lymphocytes Absolute 2.0 0.7 - 3.1 x10E3/uL   Monocytes Absolute 0.7 0.1 - 0.9 x10E3/uL   EOS (ABSOLUTE) 0.1 0.0 - 0.4 x10E3/uL   Basophils Absolute 0.0 0.0 - 0.2 x10E3/uL   Immature Granulocytes 0 Not Estab. %   Immature Grans (Abs) 0.0 0.0 - 0.1 x10E3/uL  CMP14+EGFR  Result Value Ref Range   Glucose 105 (H) 65 - 99 mg/dL   BUN 18 8 - 27 mg/dL   Creatinine, Ser 0.76 0.57 - 1.00 mg/dL   GFR calc non Af Amer 78 >59 mL/min/1.73   GFR calc Af Amer 89 >59 mL/min/1.73   BUN/Creatinine Ratio 24 12 - 28   Sodium 140 134 - 144 mmol/L   Potassium 4.6 3.5 - 5.2 mmol/L   Chloride 101 96 - 106 mmol/L   CO2 22 20 - 29 mmol/L   Calcium 9.7 8.7 - 10.3  mg/dL   Total Protein 6.7 6.0 - 8.5 g/dL   Albumin 4.2 3.5 - 4.8 g/dL   Globulin, Total 2.5 1.5 - 4.5 g/dL   Albumin/Globulin Ratio 1.7 1.2 - 2.2   Bilirubin Total 0.4 0.0 - 1.2 mg/dL   Alkaline Phosphatase 75 39 - 117 IU/L   AST 18 0 - 40 IU/L   ALT 14 0 - 32 IU/L  Lipid panel  Result Value Ref Range   Cholesterol, Total 132 100 - 199 mg/dL   Triglycerides 103 0 - 149 mg/dL   HDL 47 >39 mg/dL   VLDL Cholesterol Cal 21 5 - 40 mg/dL   LDL Calculated 64 0 - 99 mg/dL   Chol/HDL Ratio 2.8 0.0 - 4.4 ratio      Assessment & Plan:   1. Essential hypertension  2. Type 2 diabetes mellitus with diabetic neuropathic arthropathy, with long-term current use of insulin (HCC) - Bayer DCA Hb A1c Waived  3. Chronic left shoulder pain  4. Eczema, unspecified type - clobetasol cream (TEMOVATE) 0.05 %; Apply 1 application topically 2 (two) times daily.  Dispense: 60 g; Refill: 0    Current Outpatient Medications:  .  aspirin 81 MG EC tablet, Take 1 tablet (81 mg total) by mouth daily., Disp: , Rfl:  .  Cholecalciferol (VITAMIN D3) 5000 UNITS TABS, Take 5,000 Units by mouth daily., Disp: , Rfl:  .  citalopram (CELEXA) 20 MG tablet, TAKE 1 TABLET (20 MG TOTAL) BY MOUTH DAILY., Disp: , Rfl: 3 .  clobetasol cream (TEMOVATE) 5.63 %, Apply 1 application  topically 2 (two) times daily., Disp: 60 g, Rfl: 0 .  diltiazem (CARDIZEM CD) 240 MG 24 hr capsule, TAKE ONE CAPSULE BY MOUTH ON AN EMPTY STOMACH IN THE MORNING, Disp: 90 capsule, Rfl: 1 .  gabapentin (NEURONTIN) 400 MG capsule, Take 1 capsule (400 mg total) by mouth at bedtime. Take 1-3 capsules at bedtime, Disp: 90 capsule, Rfl: 11 .  ibuprofen (ADVIL,MOTRIN) 200 MG tablet, Take 200 mg by mouth every 6 (six) hours as needed., Disp: , Rfl:  .  insulin NPH Human (HUMULIN N) 100 UNIT/ML injection, Inject 0.6 mLs (60 Units total) into the skin 2 (two) times daily., Disp: 30 mL, Rfl: 11 .  insulin regular (HUMULIN R) 100 units/mL injection, Inject  0.1-0.2 mLs (10-20 Units total) into the skin 3 (three) times daily before meals. 10-20 units before meals, Disp: 60 mL, Rfl: 5 .  Insulin Syringe-Needle U-100 (INSULIN SYRINGE .5CC/30GX1/2") 30G X 1/2" 0.5 ML MISC, Inject 1 each into the skin 5 (five) times daily., Disp: 300 each, Rfl: 5 .  loratadine (CLARITIN) 10 MG tablet, Take 1 tablet (10 mg total) by mouth daily., Disp: 90 tablet, Rfl: 3 .  losartan-hydrochlorothiazide (HYZAAR) 100-25 MG tablet, Take 1 tablet by mouth daily., Disp: 90 tablet, Rfl: 3 .  meclizine (ANTIVERT) 25 MG tablet, Take 1 tablet (25 mg total) by mouth 3 (three) times daily as needed for dizziness., Disp: 30 tablet, Rfl: 5 .  metFORMIN (GLUCOPHAGE) 1000 MG tablet, Take 1 tablet (1,000 mg total) by mouth 2 (two) times daily with a meal., Disp: 180 tablet, Rfl: 3 .  ONE TOUCH ULTRA TEST test strip, TEST TWICE DAILY IN VITRO 90 DAYS, Disp: 100 each, Rfl: 2 .  simvastatin (ZOCOR) 40 MG tablet, Take 1 tablet (40 mg total) by mouth daily., Disp: 90 tablet, Rfl: 3 Continue all other maintenance medications as listed above.  Follow up plan: Return in about 3 months (around 11/30/2017) for recheck.  Educational handout given for [place] PA-C [place] [address]  [place], [place] [address] [phone]   09/01/2017, 8:51 AM

## 2017-09-03 ENCOUNTER — Other Ambulatory Visit: Payer: Self-pay | Admitting: Physician Assistant

## 2017-09-25 ENCOUNTER — Other Ambulatory Visit: Payer: Self-pay | Admitting: Physician Assistant

## 2017-09-25 DIAGNOSIS — E7800 Pure hypercholesterolemia, unspecified: Secondary | ICD-10-CM

## 2017-10-02 ENCOUNTER — Other Ambulatory Visit: Payer: Self-pay | Admitting: *Deleted

## 2017-10-02 MED ORDER — CITALOPRAM HYDROBROMIDE 20 MG PO TABS: ORAL_TABLET | ORAL | 0 refills | Status: DC

## 2017-10-04 ENCOUNTER — Other Ambulatory Visit: Payer: Self-pay | Admitting: Physician Assistant

## 2017-10-04 ENCOUNTER — Ambulatory Visit (INDEPENDENT_AMBULATORY_CARE_PROVIDER_SITE_OTHER): Payer: Medicare Other | Admitting: Family Medicine

## 2017-10-04 ENCOUNTER — Encounter: Payer: Self-pay | Admitting: Family Medicine

## 2017-10-04 VITALS — BP 117/61 | HR 81 | Temp 99.0°F | Resp 97 | Ht 67.0 in | Wt 219.0 lb

## 2017-10-04 DIAGNOSIS — K5792 Diverticulitis of intestine, part unspecified, without perforation or abscess without bleeding: Secondary | ICD-10-CM

## 2017-10-04 MED ORDER — METRONIDAZOLE 500 MG PO TABS: 500.0000 mg | ORAL_TABLET | Freq: Two times a day (BID) | ORAL | 0 refills | Status: DC

## 2017-10-04 MED ORDER — CIPROFLOXACIN HCL 500 MG PO TABS: 500.0000 mg | ORAL_TABLET | Freq: Two times a day (BID) | ORAL | 0 refills | Status: DC

## 2017-10-04 NOTE — Progress Notes (Signed)
Subjective:  Patient ID: Adrienne Cook, female    DOB: 1941-02-17  Age: 77 y.o. MRN: [id number]  CC: Abdominal Pain (lower ) and Back Pain (no urinary symptoms / no GI upset )   HPI [name] presents for lower abdominal pain starting yesterday.  It is a sharp pain in the anterior abdominal fold region bilaterally.  Somewhat worse on the left.  There is also some discomfort in the midline on the back.  The lower abdominal pain it is sharp and described as 7-9/10 whereas the low back pain is dull nonradiating aching and 5-6/10.  There has been no upper respiratory symptoms.  She denies nausea vomiting diarrhea and heartburn.  She had a normal bowel movement this morning.  Patient admits that she has been eating foods containing holes and seeds recently.  Depression screen [facility] 2/9 10/04/2017 09/01/2017 07/07/2017  Decreased Interest 0 0 0  Down, Depressed, Hopeless 1 0 0  PHQ - 2 Score 1 0 0  Altered sleeping - - -  Tired, decreased energy - - -  Change in appetite - - -  Feeling bad or failure about yourself  - - -  Trouble concentrating - - -  Moving slowly or fidgety/restless - - -  Suicidal thoughts - - -  PHQ-9 Score - - -    History [name] has a past medical history of Arthritis, Chest pain, DM (diabetes mellitus) ([place]), HTN (hypertension), Hypercholesterolemia, Irregular heartbeat, Obesity, PONV (postoperative nausea and vomiting), Seasonal allergies, and Sleep apnea.   She has a past surgical history that includes Cholecystectomy; Vaginal hysterectomy (1978); Examination under anesthesia; Carpal tunnel release; Appendectomy; Hammer toe surgery; Breast cyst excision; Colonoscopy (02/13/2012); left heart catheterization with coronary angiogram (N/A, 02/28/2012); and left heart cath (02/28/2012).   Her family history includes Heart failure in her mother.She reports that  has never smoked. she has never used smokeless tobacco. She reports that she does not drink alcohol or use  drugs.    ROS Review of Systems  Constitutional: Negative for activity change, appetite change and fever.  HENT: Negative for congestion, rhinorrhea and sore throat.   Eyes: Negative for visual disturbance.  Respiratory: Negative for cough and shortness of breath.   Cardiovascular: Negative for chest pain and palpitations.  Gastrointestinal: Positive for abdominal distention and abdominal pain. Negative for anal bleeding, blood in stool, diarrhea and nausea.  Genitourinary: Negative for dysuria.  Musculoskeletal: Negative for arthralgias and myalgias.    Objective:  BP 117/61 (BP Location: Left Arm)   Pulse 81   Temp 99 F (37.2 C) (Oral)   Resp (!) 97   Ht 5\' 7"  (1.702 m)   Wt 219 lb (99.3 kg)   BMI 34.30 kg/m   BP Readings from Last 3 Encounters:  10/04/17 117/61  09/01/17 128/73  07/10/17 137/70    Wt Readings from Last 3 Encounters:  10/04/17 219 lb (99.3 kg)  09/01/17 218 lb (98.9 kg)  07/10/17 215 lb (97.5 kg)     Physical Exam  Constitutional: She is oriented to person, place, and time. She appears well-developed and well-nourished. No distress.  HENT:  Head: Normocephalic and atraumatic.  Right Ear: External ear normal.  Left Ear: External ear normal.  Nose: Nose normal.  Mouth/Throat: Oropharynx is clear and moist.  Eyes: Conjunctivae and EOM are normal. Pupils are equal, round, and reactive to light.  Neck: Normal range of motion. Neck supple. No thyromegaly present.  Cardiovascular: Normal rate, regular rhythm and normal  heart sounds.  No murmur heard. Pulmonary/Chest: Effort normal and breath sounds normal. No respiratory distress. She has no wheezes. She has no rales.  Abdominal: Soft. Bowel sounds are normal. She exhibits no distension. There is tenderness (Left lower quadrant to midline at the lower abdominal fold/lap line).  Lymphadenopathy:    She has no cervical adenopathy.  Neurological: She is alert and oriented to person, place, and time.  She has normal reflexes.  Skin: Skin is warm and dry.  Psychiatric: She has a normal mood and affect. Her behavior is normal. Judgment and thought content normal.      Assessment & Plan:   Shreeya was seen today for abdominal pain and back pain.  Diagnoses and all orders for this visit:  Diverticulitis -     metroNIDAZOLE (FLAGYL) 500 MG tablet; Take 1 tablet (500 mg total) by mouth 2 (two) times daily. -     ciprofloxacin (CIPRO) 500 MG tablet; Take 1 tablet (500 mg total) by mouth 2 (two) times daily.    Also can use MiraLAX to empty the bowels more completely.  Use Metamucil 1 tablespoon twice daily to increase fiber and prevent exacerbations of diverticulosis/diverticulitis symptoms.   I am having [name] start on metroNIDAZOLE and ciprofloxacin. I am also having her maintain her aspirin, Vitamin D3, ibuprofen, gabapentin, loratadine, metFORMIN, losartan-hydrochlorothiazide, ONE TOUCH ULTRA TEST, insulin NPH Human, insulin regular, meclizine, INSULIN SYRINGE .5CC/30GX1/2", clobetasol cream, diltiazem, simvastatin, and citalopram.  Allergies as of 10/04/2017   No Known Allergies     Medication List        Accurate as of 10/04/17 12:59 PM. Always use your most recent med list.          aspirin 81 MG EC tablet Take 1 tablet (81 mg total) by mouth daily.   ciprofloxacin 500 MG tablet Commonly known as:  CIPRO Take 1 tablet (500 mg total) by mouth 2 (two) times daily.   citalopram 20 MG tablet Commonly known as:  CELEXA TAKE 1 TABLET (20 MG TOTAL) BY MOUTH DAILY.   clobetasol cream 0.05 % Commonly known as:  TEMOVATE Apply 1 application topically 2 (two) times daily.   diltiazem 240 MG 24 hr capsule Commonly known as:  CARDIZEM CD TAKE ONE CAPSULE BY MOUTH ON AN EMPTY STOMACH IN THE MORNING   gabapentin 400 MG capsule Commonly known as:  NEURONTIN Take 1 capsule (400 mg total) by mouth at bedtime. Take 1-3 capsules at bedtime   ibuprofen 200 MG  tablet Commonly known as:  ADVIL,MOTRIN Take 200 mg by mouth every 6 (six) hours as needed.   insulin NPH Human 100 UNIT/ML injection Commonly known as:  HUMULIN N Inject 0.6 mLs (60 Units total) into the skin 2 (two) times daily.   insulin regular 100 units/mL injection Commonly known as:  HUMULIN R Inject 0.1-0.2 mLs (10-20 Units total) into the skin 3 (three) times daily before meals. 10-20 units before meals   INSULIN SYRINGE .5CC/30GX1/2" 30G X 1/2" 0.5 ML Misc Inject 1 each into the skin 5 (five) times daily.   loratadine 10 MG tablet Commonly known as:  CLARITIN Take 1 tablet (10 mg total) by mouth daily.   losartan-hydrochlorothiazide 100-25 MG tablet Commonly known as:  HYZAAR Take 1 tablet by mouth daily.   meclizine 25 MG tablet Commonly known as:  ANTIVERT Take 1 tablet (25 mg total) by mouth 3 (three) times daily as needed for dizziness.   metFORMIN 1000 MG tablet Commonly known as:  GLUCOPHAGE Take 1 tablet (1,000 mg total) by mouth 2 (two) times daily with a meal.   metroNIDAZOLE 500 MG tablet Commonly known as:  FLAGYL Take 1 tablet (500 mg total) by mouth 2 (two) times daily.   ONE TOUCH ULTRA TEST test strip Generic drug:  glucose blood TEST TWICE DAILY IN VITRO 90 DAYS   simvastatin 40 MG tablet Commonly known as:  ZOCOR TAKE 1 TABLET (40 MG TOTAL) BY MOUTH DAILY.   Vitamin D3 5000 units Tabs Take 5,000 Units by mouth daily.        Follow-up: Return if symptoms worsen or fail to improve.  Claretta Fraise, M.D.

## 2017-10-04 NOTE — Patient Instructions (Signed)
Use OTC: miralax daily for 1 week  And Metamucil - 1 tsp twice a day

## 2017-11-05 ENCOUNTER — Other Ambulatory Visit: Payer: Self-pay | Admitting: *Deleted

## 2017-11-05 DIAGNOSIS — E119 Type 2 diabetes mellitus without complications: Secondary | ICD-10-CM

## 2017-11-05 DIAGNOSIS — Z794 Long term (current) use of insulin: Principal | ICD-10-CM

## 2017-11-05 MED ORDER — METFORMIN HCL 1000 MG PO TABS: 1000.0000 mg | ORAL_TABLET | Freq: Two times a day (BID) | ORAL | 0 refills | Status: DC

## 2017-11-05 NOTE — Telephone Encounter (Signed)
Next Ov 12/01/17

## 2017-11-13 ENCOUNTER — Other Ambulatory Visit: Payer: Self-pay | Admitting: Physician Assistant

## 2017-11-19 ENCOUNTER — Other Ambulatory Visit: Payer: Self-pay | Admitting: Physician Assistant

## 2017-12-01 ENCOUNTER — Encounter: Payer: Self-pay | Admitting: Physician Assistant

## 2017-12-01 ENCOUNTER — Ambulatory Visit (INDEPENDENT_AMBULATORY_CARE_PROVIDER_SITE_OTHER): Payer: Medicare Other | Admitting: Physician Assistant

## 2017-12-01 ENCOUNTER — Other Ambulatory Visit: Payer: Self-pay | Admitting: *Deleted

## 2017-12-01 VITALS — BP 127/67 | HR 80 | Temp 98.0°F | Ht 67.0 in | Wt 217.0 lb

## 2017-12-01 DIAGNOSIS — M25512 Pain in left shoulder: Secondary | ICD-10-CM | POA: Diagnosis not present

## 2017-12-01 DIAGNOSIS — E7800 Pure hypercholesterolemia, unspecified: Secondary | ICD-10-CM | POA: Diagnosis not present

## 2017-12-01 DIAGNOSIS — R251 Tremor, unspecified: Secondary | ICD-10-CM | POA: Diagnosis not present

## 2017-12-01 DIAGNOSIS — G8929 Other chronic pain: Secondary | ICD-10-CM

## 2017-12-01 DIAGNOSIS — E11610 Type 2 diabetes mellitus with diabetic neuropathic arthropathy: Secondary | ICD-10-CM | POA: Diagnosis not present

## 2017-12-01 DIAGNOSIS — K5792 Diverticulitis of intestine, part unspecified, without perforation or abscess without bleeding: Secondary | ICD-10-CM | POA: Diagnosis not present

## 2017-12-01 DIAGNOSIS — I10 Essential (primary) hypertension: Secondary | ICD-10-CM | POA: Diagnosis not present

## 2017-12-01 DIAGNOSIS — Z794 Long term (current) use of insulin: Secondary | ICD-10-CM | POA: Diagnosis not present

## 2017-12-01 DIAGNOSIS — R002 Palpitations: Secondary | ICD-10-CM

## 2017-12-01 DIAGNOSIS — K5790 Diverticulosis of intestine, part unspecified, without perforation or abscess without bleeding: Secondary | ICD-10-CM | POA: Insufficient documentation

## 2017-12-01 LAB — CMP14+EGFR
A/G RATIO: 1.5 (ref 1.2–2.2)
ALT: 14 IU/L (ref 0–32)
AST: 15 IU/L (ref 0–40)
Albumin: 4.1 g/dL (ref 3.5–4.8)
Alkaline Phosphatase: 80 IU/L (ref 39–117)
BUN/Creatinine Ratio: 20 (ref 12–28)
BUN: 18 mg/dL (ref 8–27)
Bilirubin Total: 0.3 mg/dL (ref 0.0–1.2)
CO2: 23 mmol/L (ref 20–29)
Calcium: 9.7 mg/dL (ref 8.7–10.3)
Chloride: 100 mmol/L (ref 96–106)
Creatinine, Ser: 0.92 mg/dL (ref 0.57–1.00)
GFR calc Af Amer: 70 mL/min/{1.73_m2} (ref >59)
GFR, EST NON AFRICAN AMERICAN: 61 mL/min/{1.73_m2} (ref >59)
GLOBULIN, TOTAL: 2.7 g/dL (ref 1.5–4.5)
Glucose: 195 mg/dL — ABNORMAL HIGH (ref 65–99)
Potassium: 4.7 mmol/L (ref 3.5–5.2)
SODIUM: 140 mmol/L (ref 134–144)
Total Protein: 6.8 g/dL (ref 6.0–8.5)

## 2017-12-01 LAB — LIPID PANEL
CHOLESTEROL TOTAL: 151 mg/dL (ref 100–199)
Chol/HDL Ratio: 3.4 ratio (ref 0.0–4.4)
HDL: 44 mg/dL (ref >39)
LDL CALC: 78 mg/dL (ref 0–99)
TRIGLYCERIDES: 145 mg/dL (ref 0–149)
VLDL Cholesterol Cal: 29 mg/dL (ref 5–40)

## 2017-12-01 LAB — BAYER DCA HB A1C WAIVED: HB A1C: 7.1 % — AB (ref <7.0)

## 2017-12-01 MED ORDER — CIPROFLOXACIN HCL 500 MG PO TABS: 500.0000 mg | ORAL_TABLET | Freq: Two times a day (BID) | ORAL | 0 refills | Status: DC

## 2017-12-01 MED ORDER — PRIMIDONE 50 MG PO TABS: 50.0000 mg | ORAL_TABLET | Freq: Four times a day (QID) | ORAL | 5 refills | Status: DC

## 2017-12-01 NOTE — Progress Notes (Signed)
BP 127/67 (BP Location: Left Arm)   Pulse 80   Temp 98 F (36.7 C) (Oral)   Ht _0  (1.702 m)   Wt 217 lb (98.4 kg)   BMI 33.99 kg/m    Subjective:    Patient ID: Adrienne Cook, female    DOB: Jan 20, 1941, 77 y.o.   MRN: [id number]  HPI: Adrienne Cook is a 77 y.o. female presenting on 12/01/2017 for Hyperlipidemia (3 mo); Hypertension; and Diabetes  This patient comes in for periodic recheck on medications and conditions including hypertension, diabetes, hyperlipidemia, recent diverticulitis, onset of tremor in the past year, chronic left shoulder pain and palpitations.  She has been seen by Dr. control in the past for her palpitations.  We will get her set back up.  It does not matter if she is at rest or active.  She does not have chest pain or radiation of it up into her neck or arm.  She does not get diaphoretic.  Patient also has had chronic left shoulder pain it is most bothersome when she tries to reach to her low back.  She is very limited movement at that point.  She is having trouble sleeping with the shoulder.  Otherwise things are doing well.  She did recently have an episode of diverticulitis she was seen in our Saturday clinic.    All medications are reviewed today. There are no reports of any problems with the medications. All of the medical conditions are reviewed and updated.  Lab work is reviewed and will be ordered as medically necessary. There are no new problems reported with today's visit.   Past Medical History:  Diagnosis Date  . 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 Chest pain    a. normal cath in 2004;  b. nl stress echo 2011;  c. 02/2012 Cath: nl cors, EF 65%.  . DM (diabetes mellitus) ([place])    insulin dependent  . HTN (hypertension)   . Hypercholesterolemia   . Irregular heartbeat    a. normal event monitor  . Obesity   . PONV (postoperative nausea and vomiting)   . Seasonal allergies   . Sleep apnea    does not tolerate CPAP   Relevant past medical,  surgical, family and social history reviewed and updated as indicated. Interim medical history since our last visit reviewed. Allergies and medications reviewed and updated. DATA REVIEWED: CHART IN EPIC  Family History reviewed for pertinent findings.  Review of Systems  Constitutional: Negative.  Negative for activity change, fatigue and fever.  HENT: Negative.   Eyes: Negative.   Respiratory: Negative.  Negative for cough, shortness of breath and wheezing.   Cardiovascular: Positive for palpitations. Negative for chest pain and leg swelling.  Gastrointestinal: Negative.  Negative for abdominal pain.  Endocrine: Negative.   Genitourinary: Negative.  Negative for dysuria.  Musculoskeletal: Positive for arthralgias, gait problem, joint swelling and myalgias.  Skin: Negative.     Allergies as of 12/01/2017   No Known Allergies     Medication List        Accurate as of 12/01/17 10:16 AM. Always use your most recent med list.          aspirin 81 MG EC tablet Take 1 tablet (81 mg total) by mouth daily.   ciprofloxacin 500 MG tablet Commonly known as:  CIPRO Take 1 tablet (500 mg total) by mouth 2 (two) times daily.   citalopram 20 MG tablet Commonly known as:  CELEXA  TAKE 1 TABLET (20 MG TOTAL) BY MOUTH DAILY.   clobetasol cream 0.05 % Commonly known as:  TEMOVATE Apply 1 application topically 2 (two) times daily.   diltiazem 240 MG 24 hr capsule Commonly known as:  CARDIZEM CD TAKE ONE CAPSULE BY MOUTH ON AN EMPTY STOMACH IN THE MORNING   gabapentin 400 MG capsule Commonly known as:  NEURONTIN TAKE 1 TO 3 CAPSULES BY MOUTH AT BEDTIME   ibuprofen 200 MG tablet Commonly known as:  ADVIL,MOTRIN Take 200 mg by mouth every 6 (six) hours as needed.   insulin NPH Human 100 UNIT/ML injection Commonly known as:  HUMULIN N Inject 0.6 mLs (60 Units total) into the skin 2 (two) times daily.   insulin regular 100 units/mL injection Commonly known as:  HUMULIN R Inject  0.1-0.2 mLs (10-20 Units total) into the skin 3 (three) times daily before meals. 10-20 units before meals   INSULIN SYRINGE .5CC/30GX1/2" 30G X 1/2" 0.5 ML Misc Inject 1 each into the skin 5 (five) times daily.   loratadine 10 MG tablet Commonly known as:  CLARITIN Take 1 tablet (10 mg total) by mouth daily.   losartan-hydrochlorothiazide 100-25 MG tablet Commonly known as:  HYZAAR Take 1 tablet by mouth daily.   meclizine 25 MG tablet Commonly known as:  ANTIVERT Take 1 tablet (25 mg total) by mouth 3 (three) times daily as needed for dizziness.   metFORMIN 1000 MG tablet Commonly known as:  GLUCOPHAGE Take 1 tablet (1,000 mg total) by mouth 2 (two) times daily with a meal.   metroNIDAZOLE 500 MG tablet Commonly known as:  FLAGYL Take 1 tablet (500 mg total) by mouth 2 (two) times daily.   ONE TOUCH ULTRA TEST test strip Generic drug:  glucose blood TEST TWICE DAILY   primidone 50 MG tablet Commonly known as:  MYSOLINE Take 1 tablet (50 mg total) by mouth 4 (four) times daily. For tremor   simvastatin 40 MG tablet Commonly known as:  ZOCOR TAKE 1 TABLET (40 MG TOTAL) BY MOUTH DAILY.   Vitamin D3 5000 units Tabs Take 5,000 Units by mouth daily.          Objective:    BP 127/67 (BP Location: Left Arm)   Pulse 80   Temp 98 F (36.7 C) (Oral)   Ht _0  (1.702 m)   Wt 217 lb (98.4 kg)   BMI 33.99 kg/m   No Known Allergies  Wt Readings from Last 3 Encounters:  12/01/17 217 lb (98.4 kg)  10/04/17 219 lb (99.3 kg)  09/01/17 218 lb (98.9 kg)    Physical Exam  Constitutional: She is oriented to person, place, and time. She appears well-developed and well-nourished.  HENT:  Head: Normocephalic and atraumatic.  Right Ear: Tympanic membrane, external ear and ear canal normal.  Left Ear: Tympanic membrane, external ear and ear canal normal.  Nose: Nose normal. No rhinorrhea.  Mouth/Throat: Oropharynx is clear and moist and mucous membranes are normal. No  oropharyngeal exudate or posterior oropharyngeal erythema.  Eyes: Conjunctivae and EOM are normal. Pupils are equal, round, and reactive to light.  Neck: Normal range of motion. Neck supple.  Cardiovascular: Normal rate, regular rhythm, normal heart sounds and intact distal pulses.  Pulmonary/Chest: Effort normal and breath sounds normal.  Abdominal: Soft. Bowel sounds are normal.  Musculoskeletal:       Left shoulder: She exhibits decreased range of motion and tenderness.       Arms: Neurological: She is alert and  oriented to person, place, and time. She has normal reflexes.  Skin: Skin is warm and dry. No rash noted.  Psychiatric: She has a normal mood and affect. Her behavior is normal. Judgment and thought content normal.    Results for orders placed or performed in visit on 09/01/17  Bayer DCA Hb A1c Waived  Result Value Ref Range   Bayer DCA Hb A1c Waived 6.6 <7.0 %      Assessment & Plan:   1. Essential hypertension - CMP14+EGFR - Bayer DCA Hb A1c Waived - Lipid panel  2. Type 2 diabetes mellitus with diabetic neuropathic arthropathy, with long-term current use of insulin (HCC) - CMP14+EGFR - Bayer DCA Hb A1c Waived - Lipid panel  3. HYPERCHOLESTEROLEMIA  4. Diverticulitis - ciprofloxacin (CIPRO) 500 MG tablet; Take 1 tablet (500 mg total) by mouth 2 (two) times daily.  Dispense: 20 tablet; Refill: 0  5. Tremor of both hands - primidone (MYSOLINE) 50 MG tablet; Take 1 tablet (50 mg total) by mouth 4 (four) times daily. For tremor  Dispense: 120 tablet; Refill: 5  6. Chronic left shoulder pain - Ambulatory referral to Orthopedic Surgery  7. Palpitations - Ambulatory referral to Cardiology    Continue all other maintenance medications as listed above.  Follow up plan: Return in about 4 weeks (around 12/29/2017) for recheck tremor.  Educational handout given for [place] PA-C [facility] [address]  [place],  [place] [address] [phone]   12/01/2017, 10:16 AM

## 2017-12-01 NOTE — Patient Instructions (Signed)
Continue current medications. Continue good therapeutic lifestyle changes which include good diet and exercise. Fall precautions discussed with patient. If an FOBT was given today- please return it to our front desk. If you are over 75 years old - you may need Prevnar 13 or the adult Pneumonia vaccine.  **Flu shots are available--- please call and schedule a FLU-CLINIC appointment**  After your visit with us today you will receive a survey in the mail or online from [employer] regarding your care with us. Please take a moment to fill this out. Your feedback is very important to us as you can help us better understand your patient needs as well as improve your experience and satisfaction. WE CARE ABOUT YOU!!!    

## 2017-12-04 ENCOUNTER — Encounter (INDEPENDENT_AMBULATORY_CARE_PROVIDER_SITE_OTHER): Payer: Self-pay | Admitting: Orthopaedic Surgery

## 2017-12-04 ENCOUNTER — Ambulatory Visit (INDEPENDENT_AMBULATORY_CARE_PROVIDER_SITE_OTHER): Payer: Self-pay

## 2017-12-04 ENCOUNTER — Ambulatory Visit (INDEPENDENT_AMBULATORY_CARE_PROVIDER_SITE_OTHER): Payer: Medicare Other | Admitting: Orthopaedic Surgery

## 2017-12-04 VITALS — BP 153/71 | HR 79 | Ht 67.0 in | Wt 217.0 lb

## 2017-12-04 DIAGNOSIS — M25512 Pain in left shoulder: Secondary | ICD-10-CM

## 2017-12-04 MED ORDER — METHYLPREDNISOLONE ACETATE 40 MG/ML IJ SUSP
40.0000 mg | INTRAMUSCULAR | Status: AC | PRN
  Administered 2017-12-04: 40 mg via INTRA_ARTICULAR

## 2017-12-04 MED ORDER — BUPIVACAINE HCL 0.25 % IJ SOLN
2.0000 mL | INTRAMUSCULAR | Status: AC | PRN
  Administered 2017-12-04: 2 mL via INTRA_ARTICULAR

## 2017-12-04 MED ORDER — BUPIVACAINE HCL 0.25 % IJ SOLN
4.0000 mL | INTRAMUSCULAR | Status: AC | PRN
  Administered 2017-12-04: 4 mL via INTRA_ARTICULAR

## 2017-12-04 MED ORDER — LIDOCAINE HCL 1 % IJ SOLN
0.5000 mL | INTRAMUSCULAR | Status: AC | PRN
  Administered 2017-12-04: .5 mL

## 2017-12-04 NOTE — Progress Notes (Signed)
Office Visit Note   Patient: Adrienne Cook           Date of Birth: Dec 07, 1940           MRN: [id number] Visit Date: 12/04/2017              Requested by: Terald Sleeper, PA-C [address] [place], [place] [address] PCP: Terald Sleeper, PA-C   Assessment & Plan: Visit Diagnoses:  1. Acute pain of left shoulder     Plan: Left shoulder is her dominant side biceps tendon injection performed.  Tolerated the injection well had improved range of motion.  She will watch carefully for hypoglycemia adjust her insulin appropriately frequently over the next 5 to 7 days as we discussed.  She has persistent problems she will call and let us know we will consider proceeding with an MRI scan to evaluate her for biceps tendinopathy.  Follow-Up Instructions: Return if symptoms worsen or fail to improve.   Orders:  Orders Placed This Encounter  Procedures  . Medium Joint Inj  . Large Joint Inj  . Medium Joint Inj  . Large Joint Inj  . XR Shoulder Left   No orders of the defined types were placed in this encounter.     Procedures: Large Joint Inj: L glenohumeral on 12/04/2017 3:52 PM Indications: pain Details: 22 G 1.5 in needle  Arthrogram: No  Medications: 4 mL bupivacaine 0.25 %; 40 mg methylPREDNISolone acetate 40 MG/ML; 0.5 mL lidocaine 1 %; 2 mL bupivacaine 0.25 % Outcome: tolerated well, no immediate complications Procedure, treatment alternatives, risks and benefits explained, specific risks discussed. Consent was given by the patient. Immediately prior to procedure a time out was called to verify the correct patient, procedure, equipment, support staff and site/side marked as required. Patient was prepped and draped in the usual sterile fashion.       Clinical Data: No additional findings.   Subjective: Chief Complaint  Patient presents with  . Left Shoulder - Pain    HPI 77 year old female with 2-month history of left shoulder pain.  She states the pain is  excruciating she has great difficulty reaching past the posterior axillary line.  She has snap her bra in front spinning around.  She denies numbness or tingling in her hand she is left-hand-dominant.  She is used ibuprofen at maximum doses which is eased it somewhat.  Originally she did not want to get an injection when she talk with her primary care physician but now states that the pain is progressed and she is requesting one.  Denies any specific injury.  Review of Systems positive for type 2 diabetes with neuropathy, high cholesterol, hypertension, fatigue, obesity, renal insufficiency, otherwise negative as it pertains HPI.   Objective: Vital Signs: BP (!) 153/71   Pulse 79   Ht 5\' 7"  (1.702 m)   Wt 217 lb (98.4 kg)   BMI 33.99 kg/m   Physical Exam  Constitutional: She is oriented to person, place, and time. She appears well-developed.  HENT:  Head: Normocephalic.  Right Ear: External ear normal.  Left Ear: External ear normal.  Eyes: Pupils are equal, round, and reactive to light.  Neck: No tracheal deviation present. No thyromegaly present.  Cardiovascular: Normal rate.  Pulmonary/Chest: Effort normal.  Abdominal: Soft.  Increased BMI.  Liver is not palpable.  Neurological: She is alert and oriented to person, place, and time.  Skin: Skin is warm and dry.  Psychiatric: She has a normal mood  and affect. Her behavior is normal.    Ortho Exam patient has limitation in internal rotation left shoulder only the posterior axillary line.  Opposite shoulder is full range of motion.  Positive Hawkins mild to moderate Neer test supraspinatus resistance is good but painful.  No subscap weakness.  Long head of the biceps is exquisitely tender on the left shoulder minimal on the right.  Specialty Comments:  No specialty comments available.  Imaging: Xr Shoulder Left  Result Date: 12/04/2017 X-ray left shoulder shows normal glenohumeral joint.  Mild acromioclavicular degenerative  changes.  No soft tissue calcification. Impression normal left shoulder glenohumeral joint mild to moderate acromioclavicular degenerative changes with spurring.    PMFS History: Patient Active Problem List   Diagnosis Date Noted  . Tremor of both hands 12/01/2017  . Diverticulitis 12/01/2017  . Chronic left shoulder pain 09/01/2017  . Eczema 09/01/2017  . Type 2 diabetes mellitus with diabetic neuropathic arthropathy ([place]) 07/10/2017  . Medial knee pain, right 06/10/2017  . Vertigo 05/23/2017  . Carpal tunnel syndrome of left wrist 05/23/2017  . 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 Renal insufficiency 03/16/2012  . Midsternal chest pain 02/28/2012  . Nausea & vomiting 02/28/2012  . Sleep apnea   . Obesity   . PALPITATIONS 04/25/2010  . HYPERCHOLESTEROLEMIA 04/23/2010  . Essential hypertension 04/23/2010  . FATIGUE 04/23/2010  . SHORTNESS OF BREATH 04/23/2010  . CHEST PAIN 04/23/2010   Past Medical History:  Diagnosis Date  . 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 Chest pain    a. normal cath in 2004;  b. nl stress echo 2011;  c. 02/2012 Cath: nl cors, EF 65%.  . DM (diabetes mellitus) ([place])    insulin dependent  . HTN (hypertension)   . Hypercholesterolemia   . Irregular heartbeat    a. normal event monitor  . Obesity   . PONV (postoperative nausea and vomiting)   . Seasonal allergies   . Sleep apnea    does not tolerate CPAP    Family History  Problem Relation Age of Onset  . Heart failure Mother        CHF   . Colon cancer Neg Hx   . Stomach cancer Neg Hx     Past Surgical History:  Procedure Laterality Date  . APPENDECTOMY    . BREAST CYST EXCISION     benign  . CARPAL TUNNEL RELEASE    . CHOLECYSTECTOMY    . COLONOSCOPY  02/13/2012  . EXAMINATION UNDER ANESTHESIA     and sphincterotomy  . HAMMER TOE SURGERY     bilateral; 5th toes  . LEFT HEART CATH  02/28/2012   Procedure: LEFT HEART CATH;  Surgeon: Hillary Bow, MD;   Location: [facility] C[facility] LAB;  Service: Cardiovascular;;  . LEFT HEART CATHETERIZATION WITH CORONARY ANGIOGRAM N/A 02/28/2012   Procedure: LEFT HEART CATHETERIZATION WITH CORONARY ANGIOGRAM;  Surgeon: Hillary Bow, MD;  Location: [facility] CATH LAB;  Service: Cardiovascular;  Laterality: N/A;  . VAGINAL HYSTERECTOMY  1978   Social History   Occupational History  . Not on file  Tobacco Use  . Smoking status: Never Smoker  . Smokeless tobacco: Never Used  . Tobacco comment: tobacco use - no  Substance and Sexual Activity  . Alcohol use: No  . Drug use: No  . Sexual activity: Not Currently    Birth control/protection: Surgical    Comment: hyst

## 2017-12-10 ENCOUNTER — Other Ambulatory Visit: Payer: Self-pay | Admitting: Physician Assistant

## 2017-12-10 DIAGNOSIS — Z794 Long term (current) use of insulin: Principal | ICD-10-CM

## 2017-12-10 DIAGNOSIS — E119 Type 2 diabetes mellitus without complications: Secondary | ICD-10-CM

## 2018-01-01 ENCOUNTER — Ambulatory Visit: Payer: Medicare Other | Admitting: Physician Assistant

## 2018-01-13 ENCOUNTER — Other Ambulatory Visit: Payer: Self-pay | Admitting: Physician Assistant

## 2018-01-13 DIAGNOSIS — E119 Type 2 diabetes mellitus without complications: Secondary | ICD-10-CM

## 2018-01-13 DIAGNOSIS — Z794 Long term (current) use of insulin: Principal | ICD-10-CM

## 2018-01-15 LAB — HM DIABETES EYE EXAM

## 2018-01-22 ENCOUNTER — Encounter: Payer: Self-pay | Admitting: Physician Assistant

## 2018-03-03 ENCOUNTER — Encounter: Payer: Self-pay | Admitting: Physician Assistant

## 2018-03-03 ENCOUNTER — Ambulatory Visit (INDEPENDENT_AMBULATORY_CARE_PROVIDER_SITE_OTHER): Payer: Medicare Other | Admitting: Physician Assistant

## 2018-03-03 VITALS — BP 140/64 | HR 89 | Temp 98.7°F | Ht 67.0 in | Wt 215.0 lb

## 2018-03-03 DIAGNOSIS — J40 Bronchitis, not specified as acute or chronic: Secondary | ICD-10-CM

## 2018-03-03 DIAGNOSIS — E11610 Type 2 diabetes mellitus with diabetic neuropathic arthropathy: Secondary | ICD-10-CM | POA: Diagnosis not present

## 2018-03-03 DIAGNOSIS — E7800 Pure hypercholesterolemia, unspecified: Secondary | ICD-10-CM

## 2018-03-03 DIAGNOSIS — Z794 Long term (current) use of insulin: Secondary | ICD-10-CM | POA: Diagnosis not present

## 2018-03-03 LAB — CMP14+EGFR
ALT: 16 IU/L (ref 0–32)
AST: 18 IU/L (ref 0–40)
Albumin/Globulin Ratio: 1.6 (ref 1.2–2.2)
Albumin: 4.3 g/dL (ref 3.5–4.8)
Alkaline Phosphatase: 73 IU/L (ref 39–117)
BUN / CREAT RATIO: 21 (ref 12–28)
BUN: 22 mg/dL (ref 8–27)
Bilirubin Total: 0.3 mg/dL (ref 0.0–1.2)
CO2: 23 mmol/L (ref 20–29)
Calcium: 9.4 mg/dL (ref 8.7–10.3)
Chloride: 99 mmol/L (ref 96–106)
Creatinine, Ser: 1.03 mg/dL — ABNORMAL HIGH (ref 0.57–1.00)
GFR calc Af Amer: 61 mL/min/{1.73_m2} (ref >59)
GFR, EST NON AFRICAN AMERICAN: 53 mL/min/{1.73_m2} — AB (ref >59)
GLOBULIN, TOTAL: 2.7 g/dL (ref 1.5–4.5)
Glucose: 161 mg/dL — ABNORMAL HIGH (ref 65–99)
POTASSIUM: 4.3 mmol/L (ref 3.5–5.2)
SODIUM: 137 mmol/L (ref 134–144)
Total Protein: 7 g/dL (ref 6.0–8.5)

## 2018-03-03 LAB — LIPID PANEL
CHOL/HDL RATIO: 2.7 ratio (ref 0.0–4.4)
Cholesterol, Total: 129 mg/dL (ref 100–199)
HDL: 48 mg/dL (ref >39)
LDL Calculated: 61 mg/dL (ref 0–99)
TRIGLYCERIDES: 98 mg/dL (ref 0–149)
VLDL Cholesterol Cal: 20 mg/dL (ref 5–40)

## 2018-03-03 LAB — BAYER DCA HB A1C WAIVED: HB A1C (BAYER DCA - WAIVED): 7.5 % — ABNORMAL HIGH (ref <7.0)

## 2018-03-03 MED ORDER — AZITHROMYCIN 250 MG PO TABS: ORAL_TABLET | ORAL | 0 refills | Status: DC

## 2018-03-03 MED ORDER — CLOTRIMAZOLE-BETAMETHASONE 1-0.05 % EX CREA: 1.0000 "application " | TOPICAL_CREAM | Freq: Two times a day (BID) | CUTANEOUS | 2 refills | Status: DC

## 2018-03-03 MED ORDER — BENZONATATE 200 MG PO CAPS: 200.0000 mg | ORAL_CAPSULE | Freq: Two times a day (BID) | ORAL | 0 refills | Status: DC | PRN

## 2018-03-03 NOTE — Patient Instructions (Signed)
In a few days you may receive a survey in the mail or online from [employer] regarding your visit with us today. Please take a moment to fill this out. Your feedback is very important to our whole office. It can help us better understand your needs as well as improve your experience and satisfaction. Thank you for taking your time to complete it. We care about you.  Tykeisha Peer, PA-C  

## 2018-03-04 NOTE — Progress Notes (Signed)
BP 140/64   Pulse 89   Temp 98.7 F (37.1 C) (Oral)   Ht _0  (1.702 m)   Wt 215 lb (97.5 kg)   BMI 33.67 kg/m    Subjective:    Patient ID: Adrienne Cook, female    DOB: 1941/04/26, 78 y.o.   MRN: [id number]  HPI: Adrienne Cook is a 77 y.o. female presenting on 03/03/2018 for Hypertension and Tremors (4 week rck )  This patient came in for a 36-monthrecheck on her diabetes.  She also 4 weeks ago was seen for a tremor.  She stated after she read the side effects of the primidone she decided not to take the medication at all.  She will just wait and see if things get better.  She reports that her fasting blood sugars have been anywhere from 130-170.  She has had some evening readings of 200.  She is currently using NPH at 35 to 40 units twice daily, and she uses our insulin 15 to 20 units at a meal.  We will have labs drawn today.  She continues with chronic pain in the knee and we will plan to see her orthopedist for another injection.  Patient with several days of progressing upper respiratory and bronchial symptoms. Initially there was more upper respiratory congestion. This progressed to having significant cough that is productive throughout the day and severe at night. There is occasional wheezing after coughing. Sometimes there is slight dyspnea on exertion. It is productive mucus that is yellow in color. Denies any blood.   Past Medical History:  Diagnosis Date  . 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 Chest pain    a. normal cath in 2004;  b. nl stress echo 2011;  c. 02/2012 Cath: nl cors, EF 65%.  . DM (diabetes mellitus) [place]    insulin dependent  . HTN (hypertension)   . Hypercholesterolemia   . Irregular heartbeat    a. normal event monitor  . Obesity   . PONV (postoperative nausea and vomiting)   . Seasonal allergies   . Sleep apnea    does not tolerate CPAP   Relevant past medical, surgical, family and social history reviewed and updated as indicated. Interim medical  history since our last visit reviewed. Allergies and medications reviewed and updated. DATA REVIEWED: CHART IN EPIC  Family History reviewed for pertinent findings.  Review of Systems  Constitutional: Positive for chills and fatigue. Negative for activity change, appetite change and fever.  HENT: Positive for congestion, postnasal drip and sore throat.   Eyes: Negative.   Respiratory: Positive for cough. Negative for wheezing.   Cardiovascular: Negative.  Negative for chest pain, palpitations and leg swelling.  Gastrointestinal: Negative.   Genitourinary: Negative.   Musculoskeletal: Positive for arthralgias and back pain.  Skin: Negative.   Neurological: Negative for headaches.    Allergies as of 03/03/2018   No Known Allergies     Medication List        Accurate as of 03/03/18 11:59 PM. Always use your most recent med list.          aspirin 81 MG EC tablet Take 1 tablet (81 mg total) by mouth daily.   azithromycin 250 MG tablet Commonly known as:  ZITHROMAX Z-PAK Take as directed   benzonatate 200 MG capsule Commonly known as:  TESSALON Take 1 capsule (200 mg total) by mouth 2 (two) times daily as needed for cough.   citalopram 20 MG  tablet Commonly known as:  CELEXA TAKE 1 TABLET (20 MG TOTAL) BY MOUTH DAILY.   clobetasol cream 0.05 % Commonly known as:  TEMOVATE Apply 1 application topically 2 (two) times daily.   clotrimazole-betamethasone cream Commonly known as:  LOTRISONE Apply 1 application topically 2 (two) times daily.   diltiazem 240 MG 24 hr capsule Commonly known as:  CARDIZEM CD TAKE ONE CAPSULE BY MOUTH ON AN EMPTY STOMACH IN THE MORNING   gabapentin 400 MG capsule Commonly known as:  NEURONTIN TAKE 1 TO 3 CAPSULES BY MOUTH AT BEDTIME   ibuprofen 200 MG tablet Commonly known as:  ADVIL,MOTRIN Take 200 mg by mouth every 6 (six) hours as needed.   insulin NPH Human 100 UNIT/ML injection Commonly known as:  HUMULIN N Inject 0.6 mLs (60  Units total) into the skin 2 (two) times daily.   insulin regular 100 units/mL injection Commonly known as:  HUMULIN R Inject 0.1-0.2 mLs (10-20 Units total) into the skin 3 (three) times daily before meals.   INSULIN SYRINGE .5CC/30GX1/2" 30G X 1/2" 0.5 ML Misc Inject 1 each into the skin 5 (five) times daily.   loratadine 10 MG tablet Commonly known as:  CLARITIN Take 1 tablet (10 mg total) by mouth daily.   losartan-hydrochlorothiazide 100-25 MG tablet Commonly known as:  HYZAAR Take 1 tablet by mouth daily.   meclizine 25 MG tablet Commonly known as:  ANTIVERT Take 1 tablet (25 mg total) by mouth 3 (three) times daily as needed for dizziness.   metFORMIN 1000 MG tablet Commonly known as:  GLUCOPHAGE Take 1 tablet (1,000 mg total) by mouth 2 (two) times daily with a meal.   ONE TOUCH ULTRA TEST test strip Generic drug:  glucose blood TEST TWICE DAILY   simvastatin 40 MG tablet Commonly known as:  ZOCOR TAKE 1 TABLET (40 MG TOTAL) BY MOUTH DAILY.   Vitamin D3 5000 units Tabs Take 5,000 Units by mouth daily.          Objective:    BP 140/64   Pulse 89   Temp 98.7 F (37.1 C) (Oral)   Ht _0  (1.702 m)   Wt 215 lb (97.5 kg)   BMI 33.67 kg/m   No Known Allergies  Wt Readings from Last 3 Encounters:  03/03/18 215 lb (97.5 kg)  12/04/17 217 lb (98.4 kg)  12/01/17 217 lb (98.4 kg)    Physical Exam  Constitutional: She is oriented to person, place, and time. She appears well-developed and well-nourished.  HENT:  Head: Normocephalic and atraumatic.  Right Ear: There is drainage and tenderness.  Left Ear: There is drainage and tenderness.  Nose: Mucosal edema and rhinorrhea present. Right sinus exhibits no maxillary sinus tenderness and no frontal sinus tenderness. Left sinus exhibits no maxillary sinus tenderness and no frontal sinus tenderness.  Mouth/Throat: Oropharyngeal exudate and posterior oropharyngeal erythema present.  Eyes: Pupils are equal,  round, and reactive to light. Conjunctivae and EOM are normal.  Neck: Normal range of motion. Neck supple.  Cardiovascular: Normal rate, regular rhythm, normal heart sounds and intact distal pulses.  Pulmonary/Chest: Effort normal. She has wheezes in the right upper field and the left upper field.  Abdominal: Soft. Bowel sounds are normal.  Neurological: She is alert and oriented to person, place, and time. She has normal reflexes.  Skin: Skin is warm and dry. No rash noted.  Psychiatric: She has a normal mood and affect. Her behavior is normal. Judgment and thought content normal.  Results for orders placed or performed in visit on 03/03/18  CMP14+EGFR  Result Value Ref Range   Glucose 161 (H) 65 - 99 mg/dL   BUN 22 8 - 27 mg/dL   Creatinine, Ser 1.03 (H) 0.57 - 1.00 mg/dL   GFR calc non Af Amer 53 (L) >59 mL/min/1.73   GFR calc Af Amer 61 >59 mL/min/1.73   BUN/Creatinine Ratio 21 12 - 28   Sodium 137 134 - 144 mmol/L   Potassium 4.3 3.5 - 5.2 mmol/L   Chloride 99 96 - 106 mmol/L   CO2 23 20 - 29 mmol/L   Calcium 9.4 8.7 - 10.3 mg/dL   Total Protein 7.0 6.0 - 8.5 g/dL   Albumin 4.3 3.5 - 4.8 g/dL   Globulin, Total 2.7 1.5 - 4.5 g/dL   Albumin/Globulin Ratio 1.6 1.2 - 2.2   Bilirubin Total 0.3 0.0 - 1.2 mg/dL   Alkaline Phosphatase 73 39 - 117 IU/L   AST 18 0 - 40 IU/L   ALT 16 0 - 32 IU/L  Lipid panel  Result Value Ref Range   Cholesterol, Total 129 100 - 199 mg/dL   Triglycerides 98 0 - 149 mg/dL   HDL 48 >39 mg/dL   VLDL Cholesterol Cal 20 5 - 40 mg/dL   LDL Calculated 61 0 - 99 mg/dL   Chol/HDL Ratio 2.7 0.0 - 4.4 ratio  Bayer DCA Hb A1c Waived  Result Value Ref Range   HB A1C (BAYER DCA - WAIVED) 7.5 (H) <7.0 %      Assessment & Plan:   1. Bronchitis - azithromycin (ZITHROMAX Z-PAK) 250 MG tablet; Take as directed  Dispense: 6 each; Refill: 0 - benzonatate (TESSALON) 200 MG capsule; Take 1 capsule (200 mg total) by mouth 2 (two) times daily as needed for cough.   Dispense: 20 capsule; Refill: 0  2. Type 2 diabetes mellitus with diabetic neuropathic arthropathy, with long-term current use of insulin (HCC) - CMP14+EGFR - Bayer DCA Hb A1c Waived  3. HYPERCHOLESTEROLEMIA - Lipid panel   Continue all other maintenance medications as listed above.  Follow up plan: Return in about 3 months (around 06/03/2018) for recheck.  Educational handout given for [place] PA-C [place] [address]  [place], [place] [address] [phone]   03/04/2018, 1:56 PM

## 2018-03-12 ENCOUNTER — Encounter: Payer: Self-pay | Admitting: *Deleted

## 2018-03-19 ENCOUNTER — Other Ambulatory Visit: Payer: Self-pay | Admitting: Physician Assistant

## 2018-03-20 ENCOUNTER — Other Ambulatory Visit: Payer: Self-pay | Admitting: Physician Assistant

## 2018-03-20 DIAGNOSIS — E7800 Pure hypercholesterolemia, unspecified: Secondary | ICD-10-CM

## 2018-04-09 ENCOUNTER — Other Ambulatory Visit: Payer: Self-pay | Admitting: Physician Assistant

## 2018-04-09 DIAGNOSIS — Z794 Long term (current) use of insulin: Principal | ICD-10-CM

## 2018-04-09 DIAGNOSIS — E119 Type 2 diabetes mellitus without complications: Secondary | ICD-10-CM

## 2018-04-12 ENCOUNTER — Other Ambulatory Visit: Payer: Self-pay | Admitting: Physician Assistant

## 2018-04-12 DIAGNOSIS — E119 Type 2 diabetes mellitus without complications: Secondary | ICD-10-CM

## 2018-04-12 DIAGNOSIS — Z794 Long term (current) use of insulin: Principal | ICD-10-CM

## 2018-04-13 ENCOUNTER — Other Ambulatory Visit: Payer: Self-pay | Admitting: Physician Assistant

## 2018-04-15 ENCOUNTER — Other Ambulatory Visit: Payer: Self-pay | Admitting: Physician Assistant

## 2018-04-30 ENCOUNTER — Other Ambulatory Visit: Payer: Self-pay | Admitting: Physician Assistant

## 2018-04-30 DIAGNOSIS — I10 Essential (primary) hypertension: Secondary | ICD-10-CM

## 2018-05-14 ENCOUNTER — Other Ambulatory Visit: Payer: Self-pay | Admitting: Physician Assistant

## 2018-05-14 DIAGNOSIS — Z794 Long term (current) use of insulin: Principal | ICD-10-CM

## 2018-05-14 DIAGNOSIS — E119 Type 2 diabetes mellitus without complications: Secondary | ICD-10-CM

## 2018-05-20 ENCOUNTER — Ambulatory Visit: Payer: Medicare Other | Admitting: Physician Assistant

## 2018-06-03 ENCOUNTER — Ambulatory Visit (INDEPENDENT_AMBULATORY_CARE_PROVIDER_SITE_OTHER): Payer: Medicare Other | Admitting: Physician Assistant

## 2018-06-03 ENCOUNTER — Encounter: Payer: Self-pay | Admitting: Physician Assistant

## 2018-06-03 VITALS — BP 151/72 | HR 82 | Temp 98.8°F | Ht 67.0 in | Wt 217.6 lb

## 2018-06-03 DIAGNOSIS — E108 Type 1 diabetes mellitus with unspecified complications: Secondary | ICD-10-CM

## 2018-06-03 DIAGNOSIS — N6310 Unspecified lump in the right breast, unspecified quadrant: Secondary | ICD-10-CM

## 2018-06-03 DIAGNOSIS — M25561 Pain in right knee: Secondary | ICD-10-CM

## 2018-06-03 DIAGNOSIS — N289 Disorder of kidney and ureter, unspecified: Secondary | ICD-10-CM

## 2018-06-03 LAB — BAYER DCA HB A1C WAIVED: HB A1C (BAYER DCA - WAIVED): 7.1 % — ABNORMAL HIGH (ref <7.0)

## 2018-06-03 MED ORDER — CITALOPRAM HYDROBROMIDE 40 MG PO TABS: 40.0000 mg | ORAL_TABLET | Freq: Every day | ORAL | 1 refills | Status: DC

## 2018-06-04 LAB — CBC WITH DIFFERENTIAL/PLATELET
BASOS ABS: 0.1 10*3/uL (ref 0.0–0.2)
Basos: 1 %
EOS (ABSOLUTE): 0.1 10*3/uL (ref 0.0–0.4)
Eos: 2 %
HEMATOCRIT: 37.4 % (ref 34.0–46.6)
Hemoglobin: 12 g/dL (ref 11.1–15.9)
Immature Grans (Abs): 0 10*3/uL (ref 0.0–0.1)
Immature Granulocytes: 0 %
Lymphocytes Absolute: 2 10*3/uL (ref 0.7–3.1)
Lymphs: 24 %
MCH: 28 pg (ref 26.6–33.0)
MCHC: 32.1 g/dL (ref 31.5–35.7)
MCV: 87 fL (ref 79–97)
MONOS ABS: 0.7 10*3/uL (ref 0.1–0.9)
Monocytes: 8 %
Neutrophils Absolute: 5.3 10*3/uL (ref 1.4–7.0)
Neutrophils: 65 %
PLATELETS: 403 10*3/uL (ref 150–450)
RBC: 4.28 x10E6/uL (ref 3.77–5.28)
RDW: 13.5 % (ref 12.3–15.4)
WBC: 8.1 10*3/uL (ref 3.4–10.8)

## 2018-06-04 LAB — CMP14+EGFR
ALK PHOS: 70 IU/L (ref 39–117)
ALT: 18 IU/L (ref 0–32)
AST: 19 IU/L (ref 0–40)
Albumin/Globulin Ratio: 1.8 (ref 1.2–2.2)
Albumin: 4.4 g/dL (ref 3.5–4.8)
BUN/Creatinine Ratio: 17 (ref 12–28)
BUN: 15 mg/dL (ref 8–27)
Bilirubin Total: 0.3 mg/dL (ref 0.0–1.2)
CALCIUM: 9.2 mg/dL (ref 8.7–10.3)
CO2: 21 mmol/L (ref 20–29)
Chloride: 102 mmol/L (ref 96–106)
Creatinine, Ser: 0.88 mg/dL (ref 0.57–1.00)
GFR calc Af Amer: 74 mL/min/{1.73_m2} (ref >59)
GFR, EST NON AFRICAN AMERICAN: 64 mL/min/{1.73_m2} (ref >59)
GLOBULIN, TOTAL: 2.5 g/dL (ref 1.5–4.5)
Glucose: 186 mg/dL — ABNORMAL HIGH (ref 65–99)
Potassium: 4.6 mmol/L (ref 3.5–5.2)
SODIUM: 141 mmol/L (ref 134–144)
Total Protein: 6.9 g/dL (ref 6.0–8.5)

## 2018-06-04 LAB — LIPID PANEL
CHOLESTEROL TOTAL: 133 mg/dL (ref 100–199)
Chol/HDL Ratio: 3 ratio (ref 0.0–4.4)
HDL: 45 mg/dL (ref >39)
LDL CALC: 66 mg/dL (ref 0–99)
TRIGLYCERIDES: 109 mg/dL (ref 0–149)
VLDL Cholesterol Cal: 22 mg/dL (ref 5–40)

## 2018-06-04 LAB — TSH: TSH: 1.56 u[IU]/mL (ref 0.450–4.500)

## 2018-06-04 LAB — MICROALBUMIN / CREATININE URINE RATIO
CREATININE, UR: 168.4 mg/dL
MICROALB/CREAT RATIO: 27.7 mg/g{creat} (ref 0.0–30.0)
MICROALBUM., U, RANDOM: 46.7 ug/mL

## 2018-06-08 ENCOUNTER — Other Ambulatory Visit: Payer: Self-pay | Admitting: Physician Assistant

## 2018-06-08 DIAGNOSIS — N6310 Unspecified lump in the right breast, unspecified quadrant: Secondary | ICD-10-CM | POA: Insufficient documentation

## 2018-06-08 NOTE — Progress Notes (Signed)
BP (!) 151/72   Pulse 82   Temp 98.8 F (37.1 C) (Oral)   Ht 5' 7" (1.702 m)   Wt 217 lb 9.6 oz (98.7 kg)   BMI 34.08 kg/m    Subjective:    Patient ID: Adrienne Cook, female    DOB: 1941/08/09, 77 y.o.   MRN: [id number]  HPI: Adrienne Cook is a 77 y.o. female presenting on 06/03/2018 for Diabetes (3 month follow up ); Hypertension; and lump in right breast This patient comes in with multiple complaints.  She is here for her regular diabetes and hypertension check.  She states that she does not feel that her diabetes will be very well controlled.  She has had dietary indiscretions.  She states she has not been as active.    Her right knee is bothering her tremendously again.  There was discussion with orthopedics about injection or future surgery.  She states she has a very hard time getting up and down her stairs.  Therefore she has not been very active.    She also has an area in the right breast at 9:00 that is enlarged and tender at times.  She has had problems with cysts in the past even has had to have surgery to have them removed.  She has not had cancer.  She states she did have a mammogram within the past year.  Nothing was seen at that time.    As far as her sugar she does report that most morning she is between 80 and 200.  She does know what causes her to have a higher readings and is trying to work on reducing.  She does need labs performed today.   Past Medical History:  Diagnosis Date  . 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 Chest pain    a. normal cath in 2004;  b. nl stress echo 2011;  c. 02/2012 Cath: nl cors, EF 65%.  . DM (diabetes mellitus) ([facility])    insulin dependent  . HTN (hypertension)   . Hypercholesterolemia   . Irregular heartbeat    a. normal event monitor  . Obesity   . PONV (postoperative nausea and vomiting)   . Seasonal allergies   . Sleep apnea    does not tolerate CPAP   Relevant past medical, surgical, family and social history reviewed and  updated as indicated. Interim medical history since our last visit reviewed. Allergies and medications reviewed and updated. DATA REVIEWED: CHART IN EPIC  Family History reviewed for pertinent findings.  Review of Systems  Constitutional: Positive for fatigue and unexpected weight change. Negative for activity change and fever.  HENT: Negative.   Eyes: Negative.   Respiratory: Negative.  Negative for cough.   Cardiovascular: Negative.  Negative for chest pain.  Gastrointestinal: Negative.  Negative for abdominal pain.  Endocrine: Negative.   Genitourinary: Negative.  Negative for dysuria.  Musculoskeletal: Positive for arthralgias and joint swelling.  Skin: Negative.   Neurological: Negative.   Psychiatric/Behavioral: The patient is nervous/anxious.     Allergies as of 06/03/2018   No Known Allergies     Medication List        Accurate as of 06/03/18 11:59 PM. Always use your most recent med list.          aspirin 81 MG EC tablet Take 1 tablet (81 mg total) by mouth daily.   citalopram 40 MG tablet Commonly known as:  CELEXA Take 1 tablet (40 mg  total) by mouth daily.   clotrimazole-betamethasone cream Commonly known as:  LOTRISONE Apply 1 application topically 2 (two) times daily.   diltiazem 240 MG 24 hr capsule Commonly known as:  CARDIZEM CD TAKE ONE CAPSULE BY MOUTH ON AN EMPTY STOMACH IN THE MORNING   gabapentin 400 MG capsule Commonly known as:  NEURONTIN TAKE 1 TO 3 CAPSULES BY MOUTH AT BEDTIME   HUMULIN N 100 UNIT/ML injection Generic drug:  insulin NPH Human INJECT 0.4 MLS (40 UNITS TOTAL) INTO THE SKIN 2 (TWO) TIMES DAILY.   HUMULIN R 100 units/mL injection Generic drug:  insulin regular INJECT 10-20 UNITS TOTAL INTO THE SKIN THREE TIMES DAILY BEFORE MEALS   INSULIN SYRINGE .5CC/30GX1/2" 30G X 1/2" 0.5 ML Misc Inject 1 each into the skin 5 (five) times daily.   loratadine 10 MG tablet Commonly known as:  CLARITIN Take 1 tablet (10 mg total) by  mouth daily.   losartan-hydrochlorothiazide 100-25 MG tablet Commonly known as:  HYZAAR TAKE 1 TABLET BY MOUTH EVERY DAY   meclizine 25 MG tablet Commonly known as:  ANTIVERT Take 1 tablet (25 mg total) by mouth 3 (three) times daily as needed for dizziness.   metFORMIN 1000 MG tablet Commonly known as:  GLUCOPHAGE TAKE 1 TABLET (1,000 MG TOTAL) BY MOUTH 2 (TWO) TIMES DAILY WITH A MEAL.   ONE TOUCH ULTRA TEST test strip Generic drug:  glucose blood TEST TWICE DAILY E11.9   simvastatin 40 MG tablet Commonly known as:  ZOCOR TAKE 1 TABLET BY MOUTH EVERY DAY   Vitamin D3 5000 units Tabs Take 5,000 Units by mouth daily.          Objective:    BP (!) 151/72   Pulse 82   Temp 98.8 F (37.1 C) (Oral)   Ht 5' 7" (1.702 m)   Wt 217 lb 9.6 oz (98.7 kg)   BMI 34.08 kg/m   No Known Allergies  Wt Readings from Last 3 Encounters:  06/03/18 217 lb 9.6 oz (98.7 kg)  03/03/18 215 lb (97.5 kg)  12/04/17 217 lb (98.4 kg)    Physical Exam  Constitutional: She is oriented to person, place, and time. She appears well-developed and well-nourished.  HENT:  Head: Normocephalic and atraumatic.  Eyes: Pupils are equal, round, and reactive to light. Conjunctivae and EOM are normal.  Cardiovascular: Normal rate, regular rhythm, normal heart sounds and intact distal pulses.    Pulmonary/Chest: Effort normal and breath sounds normal.  There is a mass at 3:00 in the right breast.  It is less than 2 cm.  It is fairly mobile.  There is mild tenderness on examination.  No other significant masses are palpated.  Abdominal: Soft. Bowel sounds are normal.  Neurological: She is alert and oriented to person, place, and time. She has normal reflexes.  Skin: Skin is warm and dry. No rash noted.  Psychiatric: She has a normal mood and affect. Her behavior is normal. Judgment and thought content normal.    Results for orders placed or performed in visit on 06/03/18  CBC with Differential/Platelet    Result Value Ref Range   WBC 8.1 3.4 - 10.8 x10E3/uL   RBC 4.28 3.77 - 5.28 x10E6/uL   Hemoglobin 12.0 11.1 - 15.9 g/dL   Hematocrit 37.4 34.0 - 46.6 %   MCV 87 79 - 97 fL   MCH 28.0 26.6 - 33.0 pg   MCHC 32.1 31.5 - 35.7 g/dL   RDW 13.5 12.3 - 15.4 %  Platelets 403 150 - 450 x10E3/uL   Neutrophils 65 Not Estab. %   Lymphs 24 Not Estab. %   Monocytes 8 Not Estab. %   Eos 2 Not Estab. %   Basos 1 Not Estab. %   Neutrophils Absolute 5.3 1.4 - 7.0 x10E3/uL   Lymphocytes Absolute 2.0 0.7 - 3.1 x10E3/uL   Monocytes Absolute 0.7 0.1 - 0.9 x10E3/uL   EOS (ABSOLUTE) 0.1 0.0 - 0.4 x10E3/uL   Basophils Absolute 0.1 0.0 - 0.2 x10E3/uL   Immature Granulocytes 0 Not Estab. %   Immature Grans (Abs) 0.0 0.0 - 0.1 x10E3/uL  CMP14+EGFR  Result Value Ref Range   Glucose 186 (H) 65 - 99 mg/dL   BUN 15 8 - 27 mg/dL   Creatinine, Ser 0.88 0.57 - 1.00 mg/dL   GFR calc non Af Amer 64 >59 mL/min/1.73   GFR calc Af Amer 74 >59 mL/min/1.73   BUN/Creatinine Ratio 17 12 - 28   Sodium 141 134 - 144 mmol/L   Potassium 4.6 3.5 - 5.2 mmol/L   Chloride 102 96 - 106 mmol/L   CO2 21 20 - 29 mmol/L   Calcium 9.2 8.7 - 10.3 mg/dL   Total Protein 6.9 6.0 - 8.5 g/dL   Albumin 4.4 3.5 - 4.8 g/dL   Globulin, Total 2.5 1.5 - 4.5 g/dL   Albumin/Globulin Ratio 1.8 1.2 - 2.2   Bilirubin Total 0.3 0.0 - 1.2 mg/dL   Alkaline Phosphatase 70 39 - 117 IU/L   AST 19 0 - 40 IU/L   ALT 18 0 - 32 IU/L  Lipid panel  Result Value Ref Range   Cholesterol, Total 133 100 - 199 mg/dL   Triglycerides 109 0 - 149 mg/dL   HDL 45 >39 mg/dL   VLDL Cholesterol Cal 22 5 - 40 mg/dL   LDL Calculated 66 0 - 99 mg/dL   Chol/HDL Ratio 3.0 0.0 - 4.4 ratio  Microalbumin / creatinine urine ratio  Result Value Ref Range   Creatinine, Urine 168.4 Not Estab. mg/dL   Microalbumin, Urine 46.7 Not Estab. ug/mL   Microalb/Creat Ratio 27.7 0.0 - 30.0 mg/g creat  TSH  Result Value Ref Range   TSH 1.560 0.450 - 4.500 uIU/mL  Bayer DCA Hb  A1c Waived  Result Value Ref Range   HB A1C (BAYER DCA - WAIVED) 7.1 (H) <7.0 %      Assessment & Plan:   1. Type 1 diabetes mellitus with complications ([facility]CC) - CBC with Differential/Platelet - CMP14+EGFR - Lipid panel - Microalbumin / creatinine urine ratio - TSH - Bayer DCA Hb A1c Waived  2. Renal insufficiency - CBC with Differential/Platelet - CMP14+EGFR - Lipid panel - Microalbumin / creatinine urine ratio - TSH - Bayer DCA Hb A1c Waived  3. Medial knee pain, right - Ambulatory referral to Orthopedic Surgery  4. Breast mass, right - US BREAST COMPLETE UNI RIGHT INC AXILLA; Future   Continue all other maintenance medications as listed above.  Follow up plan: No follow-ups on file.  Educational handout given for English PA-C [facility] [address]  [place], [place] [address] [phone]   06/08/2018, 1:01 PM

## 2018-06-18 ENCOUNTER — Encounter: Payer: Self-pay | Admitting: Physician Assistant

## 2018-06-18 ENCOUNTER — Ambulatory Visit (INDEPENDENT_AMBULATORY_CARE_PROVIDER_SITE_OTHER): Payer: Medicare Other | Admitting: Orthopaedic Surgery

## 2018-06-19 ENCOUNTER — Telehealth: Payer: Self-pay | Admitting: Physician Assistant

## 2018-06-20 ENCOUNTER — Other Ambulatory Visit: Payer: Self-pay | Admitting: Physician Assistant

## 2018-06-20 DIAGNOSIS — Z794 Long term (current) use of insulin: Principal | ICD-10-CM

## 2018-06-20 DIAGNOSIS — E119 Type 2 diabetes mellitus without complications: Secondary | ICD-10-CM

## 2018-06-25 ENCOUNTER — Encounter (INDEPENDENT_AMBULATORY_CARE_PROVIDER_SITE_OTHER): Payer: Self-pay | Admitting: Orthopaedic Surgery

## 2018-06-25 ENCOUNTER — Ambulatory Visit (INDEPENDENT_AMBULATORY_CARE_PROVIDER_SITE_OTHER): Payer: Medicare Other | Admitting: Orthopaedic Surgery

## 2018-06-25 VITALS — BP 144/69 | HR 86 | Ht 67.0 in | Wt 217.0 lb

## 2018-06-25 DIAGNOSIS — M1711 Unilateral primary osteoarthritis, right knee: Secondary | ICD-10-CM

## 2018-06-25 NOTE — Progress Notes (Signed)
Office Visit Note   Patient: Adrienne Cook           Date of Birth: [birth year]           MRN: [id number] Visit Date: 06/25/2018              Requested by: Terald Sleeper, PA-C [address] [place], [place] [address] PCP: Terald Sleeper, PA-C   Assessment & Plan: Visit Diagnoses:  1. Unilateral primary osteoarthritis, right knee     Plan: Right knee injection performed which she tolerated well.  She ambulated better after the injection with left pain.  She can return if she has increased symptoms.  Follow-Up Instructions: No follow-ups on file.   Orders:  Orders Placed This Encounter  Procedures  . Large Joint Inj   No orders of the defined types were placed in this encounter.     Procedures: Large Joint Inj: R knee on 06/26/2018 12:36 PM Indications: pain and joint swelling Details: 22 G 1.5 in needle, anterolateral approach  Arthrogram: No  Medications: 40 mg methylPREDNISolone acetate 40 MG/ML; 0.5 mL lidocaine 1 %; 4 mL bupivacaine 0.25 % Outcome: tolerated well, no immediate complications Procedure, treatment alternatives, risks and benefits explained, specific risks discussed. Consent was given by the patient. Immediately prior to procedure a time out was called to verify the correct patient, procedure, equipment, support staff and site/side marked as required. Patient was prepped and draped in the usual sterile fashion.       Clinical Data: No additional findings.   Subjective: Chief Complaint  Patient presents with  . Right Knee - Pain    HPI 77 year old female with recurrent right knee pain.  She had an injection in October 2018 and did well for 3 to 6 months with good relief of pain.  Is gradually increased and she has more pain medial than lateral with crepitus and difficulty ambulating.  Previous x-rays last year showed medial joint line narrowing without spur formation.  Patient is requesting a repeat injection since her previous one did so well for  many months.  Review of Systems 14 point review of systems updated unchanged from 07/10/2017 other than as mentioned in HPI.  Of note his diabetes on insulin.  Sleep apnea increased BMI, hypertension and high cholesterol.  Carpal tunnel syndrome and right knee pain.   Objective: Vital Signs: BP (!) 144/69   Pulse 86   Ht 5\' 7"  (1.702 m)   Wt 217 lb (98.4 kg)   BMI 33.99 kg/m   Physical Exam  Constitutional: She is oriented to person, place, and time. She appears well-developed.  HENT:  Head: Normocephalic.  Right Ear: External ear normal.  Left Ear: External ear normal.  Eyes: Pupils are equal, round, and reactive to light.  Neck: No tracheal deviation present. No thyromegaly present.  Cardiovascular: Normal rate.  Pulmonary/Chest: Effort normal.  Abdominal: Soft.  Neurological: She is alert and oriented to person, place, and time.  Skin: Skin is warm and dry.  Psychiatric: She has a normal mood and affect. Her behavior is normal.    Ortho Exam patient has tenderness over the pes bursa tenderness over the semi-tendinosis.  She does not appear to have increased swelling over the pes bursa compared to the opposite left knee.  Crepitus knee range of motion and with patellar loading and quadriceps contracture.  Mild lateral compartment narrowing collateral ligaments are stable ACL PCL is normal negative calf tenderness distal pulses are intact no plantar foot  lesions.  Normal hip range of motion. Specialty Comments:  No specialty comments available.  Imaging: No results found.   PMFS History: Patient Active Problem List   Diagnosis Date Noted  . Breast mass, right 06/08/2018  . Tremor of both hands 12/01/2017  . Diverticulitis 12/01/2017  . Chronic left shoulder pain 09/01/2017  . Eczema 09/01/2017  . Medial knee pain, right 06/10/2017  . Vertigo 05/23/2017  . Carpal tunnel syndrome of left wrist 05/23/2017  . 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 Renal insufficiency 03/16/2012  . Midsternal chest pain 02/28/2012  . Nausea & vomiting 02/28/2012  . Sleep apnea   . Obesity   . PALPITATIONS 04/25/2010  . Type 1 diabetes mellitus with complications ([place]) 05/39/7673  . HYPERCHOLESTEROLEMIA 04/23/2010  . Essential hypertension 04/23/2010  . FATIGUE 04/23/2010  . SHORTNESS OF BREATH 04/23/2010  . CHEST PAIN 04/23/2010   Past Medical History:  Diagnosis Date  . 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 Chest pain    a. normal cath in 2004;  b. nl stress echo 2011;  c. 02/2012 Cath: nl cors, EF 65%.  . DM (diabetes mellitus) ([place])    insulin dependent  . HTN (hypertension)   . Hypercholesterolemia   . Irregular heartbeat    a. normal event monitor  . Obesity   . PONV (postoperative nausea and vomiting)   . Seasonal allergies   . Sleep apnea    does not tolerate CPAP    Family History  Problem Relation Age of Onset  . Heart failure Mother        CHF   . Colon cancer Neg Hx   . Stomach cancer Neg Hx     Past Surgical History:  Procedure Laterality Date  . APPENDECTOMY    . BREAST CYST EXCISION     benign  . CARPAL TUNNEL RELEASE    . CHOLECYSTECTOMY    . COLONOSCOPY  02/13/2012  . EXAMINATION UNDER ANESTHESIA     and sphincterotomy  . HAMMER TOE SURGERY     bilateral; 5th toes  . LEFT HEART CATH  02/28/2012   Procedure: LEFT HEART CATH;  Surgeon: Hillary Bow, MD;  Location: [facility] CATH LAB;  Service: Cardiovascular;;  . LEFT HEART CATHETERIZATION WITH CORONARY ANGIOGRAM N/A 02/28/2012   Procedure: LEFT HEART CATHETERIZATION WITH CORONARY ANGIOGRAM;  Surgeon: Hillary Bow, MD;  Location: [facility] CATH LAB;  Service: Cardiovascular;  Laterality: N/A;  . VAGINAL HYSTERECTOMY  1978   Social History   Occupational History  . Not on file  Tobacco Use  . Smoking status: Never Smoker  . Smokeless tobacco: Never Used  . Tobacco comment: tobacco use - no  Substance and Sexual Activity  . Alcohol use: No  . Drug use: No   . Sexual activity: Not Currently    Birth control/protection: Surgical    Comment: hyst

## 2018-06-26 ENCOUNTER — Encounter (INDEPENDENT_AMBULATORY_CARE_PROVIDER_SITE_OTHER): Payer: Self-pay | Admitting: Orthopaedic Surgery

## 2018-06-26 DIAGNOSIS — M1711 Unilateral primary osteoarthritis, right knee: Secondary | ICD-10-CM

## 2018-06-26 MED ORDER — LIDOCAINE HCL 1 % IJ SOLN
0.5000 mL | INTRAMUSCULAR | Status: AC | PRN
  Administered 2018-06-26: .5 mL

## 2018-06-26 MED ORDER — BUPIVACAINE HCL 0.25 % IJ SOLN
4.0000 mL | INTRAMUSCULAR | Status: AC | PRN
  Administered 2018-06-26: 4 mL via INTRA_ARTICULAR

## 2018-06-26 MED ORDER — METHYLPREDNISOLONE ACETATE 40 MG/ML IJ SUSP
40.0000 mg | INTRAMUSCULAR | Status: AC | PRN
  Administered 2018-06-26: 40 mg via INTRA_ARTICULAR

## 2018-06-29 ENCOUNTER — Encounter: Payer: Self-pay | Admitting: *Deleted

## 2018-06-29 ENCOUNTER — Ambulatory Visit (INDEPENDENT_AMBULATORY_CARE_PROVIDER_SITE_OTHER): Payer: Medicare Other | Admitting: *Deleted

## 2018-06-29 ENCOUNTER — Ambulatory Visit (INDEPENDENT_AMBULATORY_CARE_PROVIDER_SITE_OTHER): Payer: Medicare Other

## 2018-06-29 VITALS — BP 125/65 | HR 72 | Ht 66.0 in | Wt 213.0 lb

## 2018-06-29 DIAGNOSIS — Z0000 Encounter for general adult medical examination without abnormal findings: Secondary | ICD-10-CM | POA: Diagnosis not present

## 2018-06-29 DIAGNOSIS — Z780 Asymptomatic menopausal state: Secondary | ICD-10-CM | POA: Diagnosis not present

## 2018-06-29 DIAGNOSIS — Z23 Encounter for immunization: Secondary | ICD-10-CM | POA: Diagnosis not present

## 2018-06-29 NOTE — Progress Notes (Signed)
Subjective:   Adrienne Cook is a 77 y.o. female who presents for a Medicare Annual Wellness Visit. [name] lives in a second floor apartment with her daughter and 2 year old grandson. She has 21 steps up to her apartment. She is separated from her husband of 51 years but they have a friendly relationship and he helps her with things like car repairs when she needs them. She had to put her 64 year old dog down last week because of kidney failure. She is dealing with this ok but is understandably sad. She has not gone without medications but she is in the donut hole currently and her insulin out of pocket cost is around $100 a month. She is unable to buy the newer medications due to cost.    Review of Systems    Patient reports that her overall health is unchanged compared to last year.  Cardiac Risk Factors include: advanced age (>76men, >65 women);diabetes mellitus;dyslipidemia;hypertension;sedentary lifestyle;obesity (BMI >30kg/m2)  Musculoskeletal: right knee pain. Cortisone injection last week. Has seen some improvement in range of motion since then. She does have some trouble coming down the stairs because of the knee pain.   All other systems negative       Current Medications (verified) Outpatient Encounter Medications as of 06/29/2018  Medication Sig  . aspirin 81 MG EC tablet Take 1 tablet (81 mg total) by mouth daily.  . Cholecalciferol (VITAMIN D3) 5000 UNITS TABS Take 5,000 Units by mouth daily.  . citalopram (CELEXA) 40 MG tablet Take 1 tablet (40 mg total) by mouth daily.  . clotrimazole-betamethasone (LOTRISONE) cream Apply 1 application topically 2 (two) times daily.  [name] diltiazem (CARDIZEM CD) 240 MG 24 hr capsule TAKE ONE CAPSULE BY MOUTH ON AN EMPTY STOMACH IN THE MORNING  . gabapentin (NEURONTIN) 400 MG capsule TAKE 1 TO 3 CAPSULES BY MOUTH AT BEDTIME  . HUMULIN N 100 UNIT/ML injection INJECT (40 UNITS TOTAL) INTO THE SKIN 2 (TWO) TIMES DAILY.  [name] HUMULIN R 100  UNIT/ML injection INJECT 10-20 UNITS TOTAL INTO THE SKIN THREE TIMES DAILY BEFORE MEALS  . Insulin Syringe-Needle U-100 (INSULIN SYRINGE .5CC/30GX1/2") 30G X 1/2" 0.5 ML MISC Inject 1 each into the skin 5 (five) times daily.  [name] loratadine (CLARITIN) 10 MG tablet Take 1 tablet (10 mg total) by mouth daily.  [name] losartan-hydrochlorothiazide (HYZAAR) 100-25 MG tablet TAKE 1 TABLET BY MOUTH EVERY DAY  . meclizine (ANTIVERT) 25 MG tablet Take 1 tablet (25 mg total) by mouth 3 (three) times daily as needed for dizziness.  . metFORMIN (GLUCOPHAGE) 1000 MG tablet TAKE 1 TABLET (1,000 MG TOTAL) BY MOUTH 2 (TWO) TIMES DAILY WITH A MEAL.  [name] ONE TOUCH ULTRA TEST test strip TEST TWICE DAILY E11.9  . simvastatin (ZOCOR) 40 MG tablet TAKE 1 TABLET BY MOUTH EVERY DAY   No facility-administered encounter medications on file as of 06/29/2018.     Allergies (verified) Patient has no known allergies.   History: Past Medical History:  Diagnosis Date  . 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 Chest pain    a. normal cath in 2004;  b. nl stress echo 2011;  c. 02/2012 Cath: nl cors, EF 65%.  . 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 HTN (hypertension)   . Hypercholesterolemia   . Irregular heartbeat    a. normal event monitor  . Obesity   . PONV (postoperative nausea and vomiting)   . Seasonal allergies   .  Sleep apnea    does not tolerate CPAP   Past Surgical History:  Procedure Laterality Date  . APPENDECTOMY    . BREAST CYST EXCISION     benign  . CARPAL TUNNEL RELEASE    . CHOLECYSTECTOMY    . COLONOSCOPY  02/13/2012  . EXAMINATION UNDER ANESTHESIA     and sphincterotomy  . HAMMER TOE SURGERY     bilateral; 5th toes  . LEFT HEART CATH  02/28/2012   Procedure: LEFT HEART CATH;  Surgeon: Hillary Bow, MD;  Location: [facility] CATH LAB;  Service: Cardiovascular;;  . LEFT HEART CATHETERIZATION WITH CORONARY ANGIOGRAM N/A 02/28/2012   Procedure: LEFT HEART CATHETERIZATION WITH CORONARY ANGIOGRAM;   Surgeon: Hillary Bow, MD;  Location[facility] CATH LAB;  Service: Cardiovascular;  Laterality: N/A;  . VAGINAL HYSTERECTOMY  1978   Family History  Problem Relation Age of Onset  . Heart failure Mother        CHF   . COPD Father   . Cancer Brother        prostate  . Kidney Stones Brother   . Epilepsy Daughter   . Kidney Stones Daughter   . Colon cancer Neg Hx   . Stomach cancer Neg Hx    Social History   Socioeconomic History  . Marital status: Married    Spouse name: Not on file  . Number of children: 1  . Years of education: 73  . Highest education level: 12th grade  Occupational History  . Occupation: Retired    Comment: Passenger transport manager  Social Needs  . Financial resource strain: Not hard at all  . Food insecurity:    Worry: Never true    Inability: Never true  . Transportation needs:    Medical: No    Non-medical: No  Tobacco Use  . Smoking status: Never Smoker  . Smokeless tobacco: Never Used  . Tobacco comment: tobacco use - no  Substance and Sexual Activity  . Alcohol use: No  . Drug use: No  . Sexual activity: Not Currently    Birth control/protection: Surgical    Comment: hyst  Lifestyle  . Physical activity:    Days per week: 0 days    Minutes per session: 0 min  . Stress: Only a little  Relationships  . Social connections:    Talks on phone: More than three times a week    Gets together: More than three times a week    Attends religious service: More than 4 times per year    Active member of club or organization: Yes    Attends meetings of clubs or organizations: More than 4 times per year    Relationship status: Separated  Other Topics Concern  . Not on file  Social History Narrative  . Not on file    Tobacco Use No.  Clinical Intake:  Pre-visit preparation completed: No  Pain : No/denies pain     Nutritional Status: BMI 25 -29 Overweight  How often do you need to have someone help you when you read instructions,  pamphlets, or other written materials from your doctor or pharmacy?: 1 - Never What is the last grade level you completed in school?: 12  Interpreter Needed?: No  Information entered by :: Chong Sicilian, RN   Activities of Daily Living In your present state of health, do you have any difficulty performing the following activities: 06/29/2018  Hearing? N  Vision? N  Difficulty concentrating or making decisions? N  Walking or climbing stairs? N  Dressing or bathing? N  Doing errands, shopping? N  Preparing Food and eating ? N  Using the Toilet? N  In the past six months, have you accidently leaked urine? Y  Comment Worsening stress and urge incontince for about 2 years  Do you have problems with loss of bowel control? N  Managing your Medications? N  Managing your Finances? N  Housekeeping or managing your Housekeeping? N  Some recent data might be hidden     Diet 3 meals a day-combination of eating at home and eating out Drinks several bottles of water throughout the day and one canned diet soda a day Drinks coffee in the morning   Exercise Current Exercise Habits: Home exercise routine(walks up and down steps into apartment), Type of exercise: walking, Time (Minutes): 10, Frequency (Times/Week): 7, Weekly Exercise (Minutes/Week): 70, Intensity: Mild, Exercise limited by: orthopedic condition(s)   Depression Screen PHQ 2/9 Scores 06/29/2018 06/03/2018 03/03/2018 12/01/2017 10/04/2017 09/01/2017 07/07/2017  PHQ - 2 Score 2 3 1 1 1  0 0  PHQ- 9 Score 2 7 - - - - -     Fall Risk Fall Risk  06/29/2018 03/03/2018 12/01/2017 10/04/2017 09/01/2017  Falls in the past year? No No Yes Yes -  Number falls in past yr: - - 2 or more 2 or more 2 or more  Injury with Fall? - - Yes Yes Yes  Comment - - - - -  Risk Factor Category  - - - - -  Follow up - - - - -    Safety Is the patient's home free of loose throw rugs in walkways, pet beds, electrical cords, etc?   yes      Grab bars in  the bathroom? yes      Walkin shower? no      Shower Seat? no      Handrails on the stairs?   yes      Adequate lighting?   yes  Patient Care Team: Theodoro Clock as PCP - General ([place])  Anthony Sar optometrist  Hospitalizations, surgeries, and ER visits in previous 12 months No hospitalizations, ER visits, or surgeries this past year.   Objective:    Today's Vitals   06/29/18 0910  BP: 125/65  Pulse: 72  Weight: 213 lb (96.6 kg)  Height: 5\' 6"  (1.676 m)   Body mass index is 34.38 kg/m.  Advanced Directives 12/25/2016 10/27/2014 02/28/2012  Does Patient Have a Medical Advance Directive? No No Patient does not have advance directive  Would patient like information on creating a medical advance directive? - No - patient declined information -  Pre-existing out of facility DNR order (yellow form or pink MOST form) - - No    Hearing/Vision  No hearing or vision deficits noted during visit.  Cognitive Function: MMSE - Mini Mental State Exam 06/29/2018  Orientation to time 5  Orientation to Place 5  Registration 3  Attention/ Calculation 5  Recall 3  Language- name 2 objects 2  Language- repeat 1  Language- follow 3 step command 3  Language- read & follow direction 1  Write a sentence 1  Copy design 1  Total score 30       Normal Cognitive Function Screening: Yes    Immunizations and Health Maintenance Immunization History  Administered Date(s) Administered  . Influenza, High Dose Seasonal PF 06/29/2016, 07/07/2017, 06/29/2018  . Pneumococcal Conjugate-13 10/09/2015  . Pneumococcal Polysaccharide-23 07/10/2012  .  Tdap 06/29/2018   Health Maintenance Due  Topic Date Due  . TETANUS/TDAP  09/29/1961  . DEXA SCAN  09/30/2007  . OPHTHALMOLOGY EXAM  12/13/2015  . INFLUENZA VACCINE  04/16/2018   Health Maintenance  Topic Date Due  . Samul Dada  09/29/1959  . DEXA SCAN  09/30/2007  . OPHTHALMOLOGY EXAM  12/13/2015  . INFLUENZA VACCINE   04/16/2018  . FOOT EXAM  09/01/2018  . HEMOGLOBIN A1C  12/02/2018  . MAMMOGRAM  01/23/2020  . COLONOSCOPY  02/12/2022  . PNA vac Low Risk Adult  Completed        Assessment:   This is a routine wellness examination for [place].    Plan:    Goals    . Exercise 150 min/wk Moderate Activity        Health Maintenance Recommendations: Influenza vaccine Bone densitometry screening   Additional Screening Recommendations: Lung: Low Dose CT Chest recommended if Age 25-80 years, 30 pack-year currently smoking OR have quit w/in 15years. Patient does not qualify. Hepatitis C Screening recommended: no  Today's Orders Orders Placed This Encounter  Procedures  . DG [facility] DEXA    Order Specific Question:   Reason for Exam (SYMPTOM  OR DIAGNOSIS REQUIRED)    Answer:   post menopausal  . Tdap vaccine greater than or equal to 7yo IM  . Flu vaccine HIGH DOSE PF  Flu shot and a Tdap administered. Dexa scan done   Keep f/u with Terald Sleeper, PA-C and any other specialty appointments you may have Continue current medications Move carefully to avoid falls. Use assistive devices like a cane or walker if needed. Aim for at least 150 minutes of moderate activity a week. This can be done with chair exercises if necessary. Read or work on puzzles daily Stay connected with friends and family Information on [employer] and [place] Extra Help Program given and explained Requested eye exam report from Dr Marin Comment  I have personally reviewed and noted the following in the patient's chart:   . Medical and social history . Use of alcohol, tobacco or illicit drugs  . Current medications and supplements . Functional ability and status . Nutritional status . Physical activity . Advanced directives . List of other physicians . Hospitalizations, surgeries, and ER visits in previous 12 months . Vitals . Screenings to include cognitive, depression, and falls . Referrals and appointments  In addition, I  have reviewed and discussed with patient certain preventive protocols, quality metrics, and best practice recommendations. A written personalized care plan for preventive services as well as general preventive health recommendations were provided to patient.     Chong Sicilian, RN   06/29/2018

## 2018-06-29 NOTE — Patient Instructions (Signed)
Adrienne Cook , Thank you for taking time to come for your Medicare Wellness Visit. I appreciate your ongoing commitment to your health goals. Please review the following plan we discussed and let me know if I can assist you in the future.   These are the goals we discussed: Goals    . Exercise 150 min/wk Moderate Activity       This is a list of the screening recommended for you and due dates:  Health Maintenance  Topic Date Due  . Tetanus Vaccine  09[birth year]29[birth year]1961  . DEXA scan (bone density measurement)  09/30/2007  . Eye exam for diabetics  12/13/2015  . Flu Shot  04/16/2018  . Complete foot exam   09/01/2018  . Hemoglobin A1C  12/02/2018  . Mammogram  01/23/2020  . Colon Cancer Screening  02/12/2022  . Pneumonia vaccines  Completed    DTaP Vaccine (Diphtheria, Tetanus, and Pertussis): What You Need to Know 1. Why get vaccinated? Diphtheria, tetanus, and pertussis are serious diseases caused by bacteria. Diphtheria and pertussis are spread from person to person. Tetanus enters the body through cuts or wounds. DIPHTHERIA causes a thick covering in the back of the throat.  It can lead to breathing problems, paralysis, heart failure, and even death.  TETANUS (Lockjaw) causes painful tightening of the muscles, usually all over the body.  It can lead to "locking" of the jaw so the victim cannot open his mouth or swallow. Tetanus leads to death in up to 2 out of 10 cases.  PERTUSSIS (Whooping Cough) causes coughing spells so bad that it is hard for infants to eat, drink, or breathe. These spells can last for weeks.  It can lead to pneumonia, seizures (jerking and staring spells), brain damage, and death.  Diphtheria, tetanus, and pertussis vaccine (DTaP) can help prevent these diseases. Most children who are vaccinated with DTaP will be protected throughout childhood. Many more children would get these diseases if we stopped vaccinating. DTaP is a safer version of an older vaccine called  DTP. DTP is no longer used in the [place]. 2. Who should get DTaP vaccine and when? Children should get 5 doses of DTaP vaccine, one dose at each of the following ages:  2 months  4 months  6 months  15-18 months  4-6 years  DTaP may be given at the same time as other vaccines. 3. Some children should not get DTaP vaccine or should wait  Children with minor illnesses, such as a cold, may be vaccinated. But children who are moderately or severely ill should usually wait until they recover before getting DTaP vaccine.  Any child who had a life-threatening allergic reaction after a dose of DTaP should not get another dose.  Any child who suffered a brain or nervous system disease within 7 days after a dose of DTaP should not get another dose.  Talk with your doctor if your child: ? had a seizure or collapsed after a dose of DTaP, ? cried non-stop for 3 hours or more after a dose of DTaP, ? had a fever over 105F after a dose of DTaP. Ask your doctor for more information. Some of these children should not get another dose of pertussis vaccine, but may get a vaccine without pertussis, called DT. 4. Older children and adults DTaP is not licensed for adolescents, adults, or children 75 years of age and older. But older people still need protection. A vaccine called Tdap is similar to DTaP. A single dose  of Tdap is recommended for people 11 through 75 years of age. Another vaccine, called Td, protects against tetanus and diphtheria, but not pertussis. It is recommended every 10 years. There are separate Vaccine Information Statements for these vaccines. 5. What are the risks from DTaP vaccine? Getting diphtheria, tetanus, or pertussis disease is much riskier than getting DTaP vaccine. However, a vaccine, like any medicine, is capable of causing serious problems, such as severe allergic reactions. The risk of DTaP vaccine causing serious harm, or death, is extremely small. Mild problems  (common)  Fever (up to about 1 child in 4)  Redness or swelling where the shot was given (up to about 1 child in 4)  Soreness or tenderness where the shot was given (up to about 1 child in 4) These problems occur more often after the 4th and 5th doses of the DTaP series than after earlier doses. Sometimes the 4th or 5th dose of DTaP vaccine is followed by swelling of the entire arm or leg in which the shot was given, lasting 1-7 days (up to about 1 child in 66). Other mild problems include:  Fussiness (up to about 1 child in 3)  Tiredness or poor appetite (up to about 1 child in 10)  Vomiting (up to about 1 child in 44) These problems generally occur 1-3 days after the shot. Moderate problems (uncommon)  Seizure (jerking or staring) (about 1 child out of 14,000)  Non-stop crying, for 3 hours or more (up to about 1 child out of 1,000)  High fever, over 105F (about 1 child out of 16,000) Severe problems (very rare)  Serious allergic reaction (less than 1 out of a million doses)  Several other severe problems have been reported after DTaP vaccine. These include: ? Long-term seizures, coma, or lowered consciousness ? Permanent brain damage. These are so rare it is hard to tell if they are caused by the vaccine. Controlling fever is especially important for children who have had seizures, for any reason. It is also important if another family member has had seizures. You can reduce fever and pain by giving your child an aspirin-free pain reliever when the shot is given, and for the next 24 hours, following the package instructions. 6. What if there is a serious reaction? What should I look for? Look for anything that concerns you, such as signs of a severe allergic reaction, very high fever, or behavior changes. Signs of a severe allergic reaction can include hives, swelling of the face and throat, difficulty breathing, a fast heartbeat, dizziness, and weakness. These would start a few  minutes to a few hours after the vaccination. What should I do?  If you think it is a severe allergic reaction or other emergency that can't wait, call 9-1-1 or get the person to the nearest hospital. Otherwise, call your doctor.  Afterward, the reaction should be reported to the Vaccine Adverse Event Reporting System (VAERS). Your doctor might file this report, or you can do it yourself through the VAERS web site at www.vaers.SamedayNews.es, or by calling 863-487-0899. ? VAERS is only for reporting reactions. They do not give medical advice. 7. The National Vaccine Injury Compensation Program The [employer] Vaccine Injury Compensation Program (VICP) is a federal program that was created to compensate people who may have been injured by certain vaccines. Persons who believe they may have been injured by a vaccine can learn about the program and about filing a claim by calling (229) 588-0397 or visiting the [place] website at  GoldCloset.com.ee. 8. How can I learn more?  Ask your doctor.  Call your local or state health department.  Contact the Centers for Disease Control and Prevention (CDC): ? Call (786)007-3473 (1-800-CDC-INFO) or ? Visit CDC's website at http://hunter.com/ CDC DTaP Vaccine (Diphtheria, Tetanus, and Pertussis) VIS (01/30/06) This information is not intended to replace advice given to you by your health care provider. Make sure you discuss any questions you have with your health care provider. Document Released: 06/30/2006 Document Revised: 05/23/2016 Document Reviewed: 05/23/2016 Elsevier Interactive Patient Education  2017 [employer].

## 2018-07-11 ENCOUNTER — Other Ambulatory Visit: Payer: Self-pay | Admitting: Physician Assistant

## 2018-07-23 ENCOUNTER — Other Ambulatory Visit: Payer: Self-pay | Admitting: Physician Assistant

## 2018-07-23 DIAGNOSIS — I10 Essential (primary) hypertension: Secondary | ICD-10-CM

## 2018-08-04 ENCOUNTER — Other Ambulatory Visit: Payer: Self-pay | Admitting: Physician Assistant

## 2018-08-04 DIAGNOSIS — E119 Type 2 diabetes mellitus without complications: Secondary | ICD-10-CM

## 2018-08-04 DIAGNOSIS — Z794 Long term (current) use of insulin: Principal | ICD-10-CM

## 2018-08-09 ENCOUNTER — Other Ambulatory Visit: Payer: Self-pay | Admitting: Physician Assistant

## 2018-08-09 DIAGNOSIS — Z794 Long term (current) use of insulin: Principal | ICD-10-CM

## 2018-08-09 DIAGNOSIS — E119 Type 2 diabetes mellitus without complications: Secondary | ICD-10-CM

## 2018-08-10 ENCOUNTER — Other Ambulatory Visit: Payer: Self-pay | Admitting: *Deleted

## 2018-08-10 DIAGNOSIS — I10 Essential (primary) hypertension: Secondary | ICD-10-CM

## 2018-08-10 MED ORDER — LOSARTAN POTASSIUM-HCTZ 100-25 MG PO TABS: 1.0000 | ORAL_TABLET | Freq: Every day | ORAL | 0 refills | Status: DC

## 2018-08-17 ENCOUNTER — Telehealth: Payer: Self-pay | Admitting: Physician Assistant

## 2018-08-18 ENCOUNTER — Other Ambulatory Visit: Payer: Self-pay | Admitting: Physician Assistant

## 2018-08-18 MED ORDER — LISINOPRIL-HYDROCHLOROTHIAZIDE 20-25 MG PO TABS: 1.0000 | ORAL_TABLET | Freq: Every day | ORAL | 3 refills | Status: DC

## 2018-08-18 NOTE — Telephone Encounter (Signed)
This is a new prescription for lisinopril hydrochlorothiazide.  It has a medicine somewhat like losartan plus a fluid pill.

## 2018-08-24 NOTE — Telephone Encounter (Signed)
Pt aware and has already picked up rx.

## 2018-09-02 ENCOUNTER — Other Ambulatory Visit: Payer: Self-pay | Admitting: Physician Assistant

## 2018-09-02 DIAGNOSIS — Z794 Long term (current) use of insulin: Principal | ICD-10-CM

## 2018-09-02 DIAGNOSIS — E119 Type 2 diabetes mellitus without complications: Secondary | ICD-10-CM

## 2018-09-02 NOTE — Telephone Encounter (Signed)
OV 09/04/18

## 2018-09-04 ENCOUNTER — Ambulatory Visit (INDEPENDENT_AMBULATORY_CARE_PROVIDER_SITE_OTHER): Payer: Medicare Other | Admitting: Physician Assistant

## 2018-09-04 ENCOUNTER — Encounter: Payer: Self-pay | Admitting: Physician Assistant

## 2018-09-04 VITALS — BP 133/67 | HR 84 | Temp 98.6°F | Ht 66.0 in | Wt 211.6 lb

## 2018-09-04 DIAGNOSIS — J0110 Acute frontal sinusitis, unspecified: Secondary | ICD-10-CM | POA: Diagnosis not present

## 2018-09-04 DIAGNOSIS — E108 Type 1 diabetes mellitus with unspecified complications: Secondary | ICD-10-CM | POA: Diagnosis not present

## 2018-09-04 DIAGNOSIS — Z6841 Body Mass Index (BMI) 40.0 and over, adult: Secondary | ICD-10-CM

## 2018-09-04 LAB — BAYER DCA HB A1C WAIVED: HB A1C: 6.8 % (ref <7.0)

## 2018-09-04 MED ORDER — AMOXICILLIN 500 MG PO CAPS: 500.0000 mg | ORAL_CAPSULE | Freq: Three times a day (TID) | ORAL | 0 refills | Status: DC

## 2018-09-06 NOTE — Progress Notes (Addendum)
BP 133/67   Pulse 84   Temp 98.6 F (37 C) (Oral)   Ht 5\' 6"  (1.676 m)   Wt 211 lb 9.6 oz (96 kg)   BMI 34.15 kg/m    Subjective:    Patient ID: Adrienne Cook, female    DOB: [birth year], 77 y.o.   MRN: [id number]  HPI: Adrienne Cook is a 77 y.o. female presenting on 09/04/2018 for Diabetes and Hypertension  This patient comes in for periodic recheck on medications and conditions including type 1 diabetes.  She does need labs today.  She does think that it will be not very well controlled.  She has had a few high readings.  This patient has had many days of sinus headache and postnasal drainage. There is copious drainage at times. Denies any fever at this time. There has been a history of sinus infections in the past.  No history of sinus surgery. There is cough at night. It has become more prevalent in recent days. .  This patient also has a classification morbid obesity.  She is working on diet and exercise.  However she does have a hard time with her exercise due to the osteoarthritis in her knee.  There were other things to be discussed in her yearly review.  She has not had any issues with vascular disease or seizure.  These are not problems on her active medical history.  All medications are reviewed today. There are no reports of any problems with the medications. All of the medical conditions are reviewed and updated.  Lab work is reviewed and will be ordered as medically necessary. There are no new problems reported with today's visit.   Past Medical History:  Diagnosis Date  . 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 Chest pain    a. normal cath in 2004;  b. nl stress echo 2011;  c. 02/2012 Cath: nl cors, EF 65%.  . DM (diabetes mellitus) ([place])    insulin dependent  . HTN (hypertension)   . Hypercholesterolemia   . Irregular heartbeat    a. normal event monitor  . Obesity   . PONV (postoperative nausea and vomiting)   . Seasonal allergies   . Sleep apnea    does not tolerate  CPAP   Relevant past medical, surgical, family and social history reviewed and updated as indicated. Interim medical history since our last visit reviewed. Allergies and medications reviewed and updated. DATA REVIEWED: CHART IN EPIC  Family History reviewed for pertinent findings.  Review of Systems  Constitutional: Positive for chills and fatigue. Negative for activity change, appetite change and fever.  HENT: Positive for congestion, postnasal drip, sinus pressure, sinus pain and sore throat.   Eyes: Negative.   Respiratory: Negative for cough and wheezing.   Cardiovascular: Negative.  Negative for chest pain, palpitations and leg swelling.  Gastrointestinal: Negative.   Genitourinary: Negative.   Musculoskeletal: Negative.   Skin: Negative.   Neurological: Positive for headaches.    Allergies as of 09/04/2018   No Known Allergies     Medication List       Accurate as of September 04, 2018 11:59 PM. Always use your most recent med list.        amoxicillin 500 MG capsule Commonly known as:  AMOXIL Take 1 capsule (500 mg total) by mouth 3 (three) times daily.   aspirin 81 MG EC tablet Take 1 tablet (81 mg total) by mouth daily.   calcium elemental as  carbonate 400 MG chewable tablet Commonly known as:  BARIATRIC TUMS ULTRA Chew 1,000 mg by mouth 3 (three) times daily.   citalopram 20 MG tablet Commonly known as:  CELEXA TAKE 1 TABLET (20 MG TOTAL) BY MOUTH DAILY.   clotrimazole-betamethasone cream Commonly known as:  LOTRISONE Apply 1 application topically 2 (two) times daily.   diltiazem 240 MG 24 hr capsule Commonly known as:  CARDIZEM CD TAKE ONE CAPSULE BY MOUTH ON AN EMPTY STOMACH IN THE MORNING   gabapentin 400 MG capsule Commonly known as:  NEURONTIN TAKE 1 TO 3 CAPSULES BY MOUTH AT BEDTIME   HUMULIN R 100 units/mL injection Generic drug:  insulin regular INJECT 10 TO 20 UNITS SUBCUTANEOUSLY THREE TIMES DAILY BEFORE MEAL(S)   insulin NPH Human 100  UNIT/ML injection Commonly known as:  HUMULIN N Inject 0.4 mLs (40 Units total) into the skin 2 (two) times daily.   INSULIN SYRINGE .5CC/30GX1/2" 30G X 1/2" 0.5 ML Misc Inject 1 each into the skin 5 (five) times daily.   lisinopril-hydrochlorothiazide 20-25 MG tablet Commonly known as:  PRINZIDE,ZESTORETIC Take 1 tablet by mouth daily.   loratadine 10 MG tablet Commonly known as:  CLARITIN Take 1 tablet (10 mg total) by mouth daily.   meclizine 25 MG tablet Commonly known as:  ANTIVERT Take 1 tablet (25 mg total) by mouth 3 (three) times daily as needed for dizziness.   metFORMIN 1000 MG tablet Commonly known as:  GLUCOPHAGE TAKE 1 TABLET (1,000 MG TOTAL) BY MOUTH 2 (TWO) TIMES DAILY WITH A MEAL.   ONE TOUCH ULTRA TEST test strip Generic drug:  glucose blood TEST TWICE DAILY E11.9   simvastatin 40 MG tablet Commonly known as:  ZOCOR TAKE 1 TABLET BY MOUTH EVERY DAY   Vitamin D3 125 MCG (5000 UT) Tabs Take 5,000 Units by mouth daily.          Objective:    BP 133/67   Pulse 84   Temp 98.6 F (37 C) (Oral)   Ht 5\' 6"  (1.676 m)   Wt 211 lb 9.6 oz (96 kg)   BMI 34.15 kg/m   No Known Allergies  Wt Readings from Last 3 Encounters:  09/04/18 211 lb 9.6 oz (96 kg)  06/29/18 213 lb (96.6 kg)  06/25/18 217 lb (98.4 kg)    Physical Exam Constitutional:      Appearance: She is well-developed.  HENT:     Head: Normocephalic and atraumatic.     Right Ear: Tympanic membrane and external ear normal. No middle ear effusion.     Left Ear: Tympanic membrane and external ear normal.  No middle ear effusion.     Nose: Mucosal edema and rhinorrhea present.     Right Sinus: No maxillary sinus tenderness.     Left Sinus: No maxillary sinus tenderness.     Mouth/Throat:     Pharynx: Uvula midline. Posterior oropharyngeal erythema present.  Eyes:     General:        Right eye: No discharge.        Left eye: No discharge.     Conjunctiva/sclera: Conjunctivae normal.      Pupils: Pupils are equal, round, and reactive to light.  Neck:     Musculoskeletal: Normal range of motion.  Cardiovascular:     Rate and Rhythm: Normal rate and regular rhythm.     Heart sounds: Normal heart sounds.  Pulmonary:     Effort: Pulmonary effort is normal. No respiratory distress.  Breath sounds: Normal breath sounds. No wheezing.  Abdominal:     Palpations: Abdomen is soft.  Lymphadenopathy:     Cervical: No cervical adenopathy.  Skin:    General: Skin is warm and dry.  Neurological:     Mental Status: She is alert and oriented to person, place, and time.     Results for orders placed or performed in visit on 09/04/18  Bayer DCA Hb A1c Waived  Result Value Ref Range   HB A1C (BAYER DCA - WAIVED) 6.8 <7.0 %      Assessment & Plan:   1. Type 1 diabetes mellitus with complications (HCC) - Bayer DCA Hb A1c Waived  2. Acute non-recurrent frontal sinusitis - amoxicillin (AMOXIL) 500 MG capsule; Take 1 capsule (500 mg total) by mouth 3 (three) times daily.  Dispense: 30 capsule; Refill: 0   Continue all other maintenance medications as listed above.  Follow up plan: Return in about 3 months (around 12/04/2018) for recheck.  Educational handout given for [place] PA-C [facility] [address]  [place], [place] [address] [phone]   09/06/2018, 10:30 PM

## 2018-09-13 ENCOUNTER — Other Ambulatory Visit: Payer: Self-pay | Admitting: Physician Assistant

## 2018-09-13 DIAGNOSIS — E7800 Pure hypercholesterolemia, unspecified: Secondary | ICD-10-CM

## 2018-09-28 ENCOUNTER — Other Ambulatory Visit: Payer: Self-pay | Admitting: Physician Assistant

## 2018-10-13 ENCOUNTER — Other Ambulatory Visit: Payer: Self-pay | Admitting: Physician Assistant

## 2018-10-13 DIAGNOSIS — Z794 Long term (current) use of insulin: Principal | ICD-10-CM

## 2018-10-13 DIAGNOSIS — E119 Type 2 diabetes mellitus without complications: Secondary | ICD-10-CM

## 2018-11-03 ENCOUNTER — Other Ambulatory Visit: Payer: Self-pay | Admitting: Physician Assistant

## 2018-11-03 DIAGNOSIS — E119 Type 2 diabetes mellitus without complications: Secondary | ICD-10-CM

## 2018-11-03 DIAGNOSIS — Z794 Long term (current) use of insulin: Principal | ICD-10-CM

## 2018-12-02 ENCOUNTER — Other Ambulatory Visit: Payer: Self-pay | Admitting: Physician Assistant

## 2018-12-03 ENCOUNTER — Encounter: Payer: Self-pay | Admitting: Physician Assistant

## 2018-12-03 ENCOUNTER — Ambulatory Visit (INDEPENDENT_AMBULATORY_CARE_PROVIDER_SITE_OTHER): Payer: Medicare Other | Admitting: Physician Assistant

## 2018-12-03 ENCOUNTER — Other Ambulatory Visit: Payer: Self-pay

## 2018-12-03 VITALS — BP 136/65 | HR 73 | Temp 97.3°F | Ht 66.0 in | Wt 214.0 lb

## 2018-12-03 DIAGNOSIS — E108 Type 1 diabetes mellitus with unspecified complications: Secondary | ICD-10-CM

## 2018-12-03 DIAGNOSIS — J301 Allergic rhinitis due to pollen: Secondary | ICD-10-CM

## 2018-12-03 DIAGNOSIS — I10 Essential (primary) hypertension: Secondary | ICD-10-CM

## 2018-12-03 LAB — LIPID PANEL
CHOLESTEROL TOTAL: 136 mg/dL (ref 100–199)
Chol/HDL Ratio: 2.9 ratio (ref 0.0–4.4)
HDL: 47 mg/dL (ref >39)
LDL CALC: 64 mg/dL (ref 0–99)
Triglycerides: 123 mg/dL (ref 0–149)
VLDL Cholesterol Cal: 25 mg/dL (ref 5–40)

## 2018-12-03 LAB — CBC WITH DIFFERENTIAL/PLATELET
Basophils Absolute: 0.1 10*3/uL (ref 0.0–0.2)
Basos: 1 %
EOS (ABSOLUTE): 0.2 10*3/uL (ref 0.0–0.4)
Eos: 2 %
Hematocrit: 39.9 % (ref 34.0–46.6)
Hemoglobin: 12.7 g/dL (ref 11.1–15.9)
Immature Grans (Abs): 0 10*3/uL (ref 0.0–0.1)
Immature Granulocytes: 0 %
LYMPHS ABS: 2.1 10*3/uL (ref 0.7–3.1)
Lymphs: 23 %
MCH: 28.5 pg (ref 26.6–33.0)
MCHC: 31.8 g/dL (ref 31.5–35.7)
MCV: 90 fL (ref 79–97)
MONOCYTES: 7 %
Monocytes Absolute: 0.7 10*3/uL (ref 0.1–0.9)
Neutrophils Absolute: 6.2 10*3/uL (ref 1.4–7.0)
Neutrophils: 67 %
Platelets: 395 10*3/uL (ref 150–450)
RBC: 4.45 x10E6/uL (ref 3.77–5.28)
RDW: 13.8 % (ref 11.7–15.4)
WBC: 9.3 10*3/uL (ref 3.4–10.8)

## 2018-12-03 LAB — BAYER DCA HB A1C WAIVED: HB A1C (BAYER DCA - WAIVED): 6.8 % (ref <7.0)

## 2018-12-03 LAB — CMP14+EGFR
ALT: 14 IU/L (ref 0–32)
AST: 18 IU/L (ref 0–40)
Albumin/Globulin Ratio: 1.6 (ref 1.2–2.2)
Albumin: 4.2 g/dL (ref 3.7–4.7)
Alkaline Phosphatase: 74 IU/L (ref 39–117)
BUN/Creatinine Ratio: 22 (ref 12–28)
BUN: 20 mg/dL (ref 8–27)
Bilirubin Total: 0.4 mg/dL (ref 0.0–1.2)
CO2: 22 mmol/L (ref 20–29)
CREATININE: 0.93 mg/dL (ref 0.57–1.00)
Calcium: 9.7 mg/dL (ref 8.7–10.3)
Chloride: 102 mmol/L (ref 96–106)
GFR calc Af Amer: 69 mL/min/{1.73_m2} (ref >59)
GFR calc non Af Amer: 60 mL/min/{1.73_m2} (ref >59)
Globulin, Total: 2.6 g/dL (ref 1.5–4.5)
Glucose: 157 mg/dL — ABNORMAL HIGH (ref 65–99)
Potassium: 5 mmol/L (ref 3.5–5.2)
Sodium: 141 mmol/L (ref 134–144)
Total Protein: 6.8 g/dL (ref 6.0–8.5)

## 2018-12-03 MED ORDER — FLUTICASONE PROPIONATE 50 MCG/ACT NA SUSP: 2.0000 | Freq: Every day | NASAL | 6 refills | Status: DC

## 2018-12-03 MED ORDER — LORATADINE 10 MG PO TABS: 10.0000 mg | ORAL_TABLET | Freq: Every day | ORAL | 3 refills | Status: DC

## 2018-12-03 MED ORDER — DILTIAZEM HCL ER COATED BEADS 240 MG PO CP24: ORAL_CAPSULE | ORAL | 3 refills | Status: DC

## 2018-12-03 MED ORDER — HYDROCODONE-HOMATROPINE 5-1.5 MG/5ML PO SYRP: 5.0000 mL | ORAL_SOLUTION | Freq: Four times a day (QID) | ORAL | 0 refills | Status: DC | PRN

## 2018-12-04 ENCOUNTER — Ambulatory Visit: Payer: Medicare Other | Admitting: Physician Assistant

## 2018-12-06 NOTE — Progress Notes (Signed)
BP 136/65   Pulse 73   Temp (!) 97.3 F (36.3 C) (Oral)   Ht '5\' 6"'$  (1.676 m)   Wt 214 lb (97.1 kg)   BMI 34.54 kg/m    Subjective:    Patient ID: Adrienne Cook, female    DOB: [birth year], 78 y.o.   MRN: [id number]  HPI: Adrienne Cook is a 78 y.o. female presenting on 12/03/2018 for Diabetes; Hypertension; and Cough  This patient comes in for periodic recheck on medications and conditions including diabetes, hypertension, and her seasonal allergies are flaring up. She even gets some cough from the postnasl drainage. Denies fever or chill. Reports that her readings have been good.   All medications are reviewed today. There are no reports of any problems with the medications. All of the medical conditions are reviewed and updated.  Lab work is reviewed and will be ordered as medically necessary. There are no new problems reported with today's visit.   Past Medical History:  Diagnosis Date  . 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 Chest pain    a. normal cath in 2004;  b. nl stress echo 2011;  c. 02/2012 Cath: nl cors, EF 65%.  . DM (diabetes mellitus) ([place])    insulin dependent  . HTN (hypertension)   . Hypercholesterolemia   . Irregular heartbeat    a. normal event monitor  . Obesity   . PONV (postoperative nausea and vomiting)   . Seasonal allergies   . Sleep apnea    does not tolerate CPAP   Relevant past medical, surgical, family and social history reviewed and updated as indicated. Interim medical history since our last visit reviewed. Allergies and medications reviewed and updated. DATA REVIEWED: CHART IN EPIC  Family History reviewed for pertinent findings.  Review of Systems  Constitutional: Negative.  Negative for activity change, fatigue and fever.  HENT: Negative.   Eyes: Negative.   Respiratory: Negative.  Negative for cough.   Cardiovascular: Negative.  Negative for chest pain.  Gastrointestinal: Negative.  Negative for abdominal pain.  Endocrine: Negative.    Genitourinary: Negative.  Negative for dysuria.  Musculoskeletal: Negative.   Skin: Negative.   Neurological: Negative.     Allergies as of 12/03/2018   No Known Allergies     Medication List       Accurate as of December 03, 2018 11:59 PM. Always use your most recent med list.        aspirin 81 MG EC tablet Take 1 tablet (81 mg total) by mouth daily.   calcium elemental as carbonate 400 MG chewable tablet Commonly known as:  BARIATRIC TUMS ULTRA Chew 1,000 mg by mouth 3 (three) times daily.   citalopram 40 MG tablet Commonly known as:  CELEXA Take 40 mg by mouth daily.   clotrimazole-betamethasone cream Commonly known as:  LOTRISONE Apply 1 application topically 2 (two) times daily.   diltiazem 240 MG 24 hr capsule Commonly known as:  CARDIZEM CD TAKE ONE CAPSULE BY MOUTH ON AN EMPTY STOMACH IN THE MORNING   fluticasone 50 MCG/ACT nasal spray Commonly known as:  FLONASE Place 2 sprays into both nostrils daily.   gabapentin 400 MG capsule Commonly known as:  NEURONTIN TAKE 1 TO 3 CAPSULES BY MOUTH AT BEDTIME   HumuLIN R 100 units/mL injection Generic drug:  insulin regular INJECT 10 TO 20 UNITS SUBCUTANEOUSLY THREE TIMES DAILY BEFORE MEAL(S)   HYDROcodone-homatropine 5-1.5 MG/5ML syrup Commonly known as:  HYCODAN Take 5-10  mLs by mouth every 6 (six) hours as needed.   insulin NPH Human 100 UNIT/ML injection Commonly known as:  HumuLIN N Inject 0.4 mLs (40 Units total) into the skin 2 (two) times daily.   INSULIN SYRINGE .5CC/30GX1/2" 30G X 1/2" 0.5 ML Misc INJECT 1 EACH INTO THE SKIN 5 (FIVE) TIMES DAILY.   lisinopril-hydrochlorothiazide 20-25 MG tablet Commonly known as:  PRINZIDE,ZESTORETIC Take 1 tablet by mouth daily.   loratadine 10 MG tablet Commonly known as:  CLARITIN Take 1 tablet (10 mg total) by mouth daily.   meclizine 25 MG tablet Commonly known as:  ANTIVERT Take 1 tablet (25 mg total) by mouth 3 (three) times daily as needed for  dizziness.   metFORMIN 1000 MG tablet Commonly known as:  GLUCOPHAGE TAKE 1 TABLET (1,000 MG TOTAL) BY MOUTH 2 (TWO) TIMES DAILY WITH A MEAL.   ONE TOUCH ULTRA TEST test strip Generic drug:  glucose blood TEST TWICE DAILY E11.9   simvastatin 40 MG tablet Commonly known as:  ZOCOR TAKE 1 TABLET BY MOUTH EVERY DAY   Vitamin D3 125 MCG (5000 UT) Tabs Take 5,000 Units by mouth daily.          Objective:    BP 136/65   Pulse 73   Temp (!) 97.3 F (36.3 C) (Oral)   Ht '5\' 6"'$  (1.676 m)   Wt 214 lb (97.1 kg)   BMI 34.54 kg/m   No Known Allergies  Wt Readings from Last 3 Encounters:  12/03/18 214 lb (97.1 kg)  09/04/18 211 lb 9.6 oz (96 kg)  06/29/18 213 lb (96.6 kg)    Physical Exam Constitutional:      Appearance: She is well-developed.  HENT:     Head: Normocephalic and atraumatic.     Right Ear: Tympanic membrane, ear canal and external ear normal.     Left Ear: Tympanic membrane, ear canal and external ear normal.     Nose: Nose normal. No rhinorrhea.     Mouth/Throat:     Pharynx: No oropharyngeal exudate or posterior oropharyngeal erythema.  Eyes:     Conjunctiva/sclera: Conjunctivae normal.     Pupils: Pupils are equal, round, and reactive to light.  Neck:     Musculoskeletal: Normal range of motion and neck supple.  Cardiovascular:     Rate and Rhythm: Normal rate and regular rhythm.     Heart sounds: Normal heart sounds.  Pulmonary:     Effort: Pulmonary effort is normal.     Breath sounds: Normal breath sounds.  Abdominal:     General: Bowel sounds are normal.     Palpations: Abdomen is soft.  Skin:    General: Skin is warm and dry.     Findings: No rash.  Neurological:     Mental Status: She is alert and oriented to person, place, and time.     Deep Tendon Reflexes: Reflexes are normal and symmetric.  Psychiatric:        Behavior: Behavior normal.        Thought Content: Thought content normal.        Judgment: Judgment normal.     Results  for orders placed or performed in visit on 12/03/18  CBC with Differential/Platelet  Result Value Ref Range   WBC 9.3 3.4 - 10.8 x10E3/uL   RBC 4.45 3.77 - 5.28 x10E6/uL   Hemoglobin 12.7 11.1 - 15.9 g/dL   Hematocrit 39.9 34.0 - 46.6 %   MCV 90 79 - 97 fL  MCH 28.5 26.6 - 33.0 pg   MCHC 31.8 31.5 - 35.7 g/dL   RDW 13.8 11.7 - 15.4 %   Platelets 395 150 - 450 x10E3/uL   Neutrophils 67 Not Estab. %   Lymphs 23 Not Estab. %   Monocytes 7 Not Estab. %   Eos 2 Not Estab. %   Basos 1 Not Estab. %   Neutrophils Absolute 6.2 1.4 - 7.0 x10E3/uL   Lymphocytes Absolute 2.1 0.7 - 3.1 x10E3/uL   Monocytes Absolute 0.7 0.1 - 0.9 x10E3/uL   EOS (ABSOLUTE) 0.2 0.0 - 0.4 x10E3/uL   Basophils Absolute 0.1 0.0 - 0.2 x10E3/uL   Immature Granulocytes 0 Not Estab. %   Immature Grans (Abs) 0.0 0.0 - 0.1 x10E3/uL  CMP14+EGFR  Result Value Ref Range   Glucose 157 (H) 65 - 99 mg/dL   BUN 20 8 - 27 mg/dL   Creatinine, Ser 0.93 0.57 - 1.00 mg/dL   GFR calc non Af Amer 60 >59 mL/min/1.73   GFR calc Af Amer 69 >59 mL/min/1.73   BUN/Creatinine Ratio 22 12 - 28   Sodium 141 134 - 144 mmol/L   Potassium 5.0 3.5 - 5.2 mmol/L   Chloride 102 96 - 106 mmol/L   CO2 22 20 - 29 mmol/L   Calcium 9.7 8.7 - 10.3 mg/dL   Total Protein 6.8 6.0 - 8.5 g/dL   Albumin 4.2 3.7 - 4.7 g/dL   Globulin, Total 2.6 1.5 - 4.5 g/dL   Albumin/Globulin Ratio 1.6 1.2 - 2.2   Bilirubin Total 0.4 0.0 - 1.2 mg/dL   Alkaline Phosphatase 74 39 - 117 IU/L   AST 18 0 - 40 IU/L   ALT 14 0 - 32 IU/L  Lipid panel  Result Value Ref Range   Cholesterol, Total 136 100 - 199 mg/dL   Triglycerides 123 0 - 149 mg/dL   HDL 47 >39 mg/dL   VLDL Cholesterol Cal 25 5 - 40 mg/dL   LDL Calculated 64 0 - 99 mg/dL   Chol/HDL Ratio 2.9 0.0 - 4.4 ratio  Bayer DCA Hb A1c Waived  Result Value Ref Range   HB A1C (BAYER DCA - WAIVED) 6.8 <7.0 %      Assessment & Plan:   1. Type 1 diabetes mellitus with complications (HCC) - CBC with  Differential/Platelet - CMP14+EGFR - Lipid panel - Bayer DCA Hb A1c Waived  2. Essential hypertension - diltiazem (CARDIZEM CD) 240 MG 24 hr capsule; TAKE ONE CAPSULE BY MOUTH ON AN EMPTY STOMACH IN THE MORNING  Dispense: 90 capsule; Refill: 3 - CBC with Differential/Platelet - CMP14+EGFR - Lipid panel - Bayer DCA Hb A1c Waived  3. Seasonal allergic rhinitis due to pollen - loratadine (CLARITIN) 10 MG tablet; Take 1 tablet (10 mg total) by mouth daily.  Dispense: 90 tablet; Refill: 3 - fluticasone (FLONASE) 50 MCG/ACT nasal spray; Place 2 sprays into both nostrils daily.  Dispense: 16 g; Refill: 6   Continue all other maintenance medications as listed above.  Follow up plan: Return in about 3 months (around 03/05/2019).  Educational handout given for [place] PA-C [facility] [address]  [place], [place] [address] [phone]   12/06/2018, 8:22 PM

## 2018-12-09 ENCOUNTER — Other Ambulatory Visit: Payer: Self-pay

## 2018-12-09 MED ORDER — CITALOPRAM HYDROBROMIDE 40 MG PO TABS: 40.0000 mg | ORAL_TABLET | Freq: Every day | ORAL | 0 refills | Status: DC

## 2018-12-11 ENCOUNTER — Other Ambulatory Visit: Payer: Self-pay | Admitting: Physician Assistant

## 2018-12-11 DIAGNOSIS — Z794 Long term (current) use of insulin: Principal | ICD-10-CM

## 2018-12-11 DIAGNOSIS — E119 Type 2 diabetes mellitus without complications: Secondary | ICD-10-CM

## 2018-12-24 ENCOUNTER — Encounter: Payer: Self-pay | Admitting: Family Medicine

## 2018-12-24 ENCOUNTER — Ambulatory Visit (INDEPENDENT_AMBULATORY_CARE_PROVIDER_SITE_OTHER): Payer: Medicare Other | Admitting: Family Medicine

## 2018-12-24 ENCOUNTER — Other Ambulatory Visit: Payer: Self-pay

## 2018-12-24 DIAGNOSIS — K5792 Diverticulitis of intestine, part unspecified, without perforation or abscess without bleeding: Secondary | ICD-10-CM | POA: Diagnosis not present

## 2018-12-24 MED ORDER — METRONIDAZOLE 500 MG PO TABS: 500.0000 mg | ORAL_TABLET | Freq: Two times a day (BID) | ORAL | 0 refills | Status: DC

## 2018-12-24 MED ORDER — CIPROFLOXACIN HCL 500 MG PO TABS: 500.0000 mg | ORAL_TABLET | Freq: Two times a day (BID) | ORAL | 0 refills | Status: AC

## 2018-12-24 NOTE — Progress Notes (Signed)
Virtual Visit via telephone Note Due to COVID-19, visit is conducted virtually and was requested by patient.  I connected with Adrienne Cook on 12/24/18 at 1445 by telephone and verified that I am speaking with the correct person using two identifiers. Adrienne Cook is currently located at home and family is currently with them during visit. The provider, Monia Pouch, FNP is located in their office at time of visit.  I discussed the limitations, risks, security and privacy concerns of performing an evaluation and management service by telephone and the availability of in person appointments. I also discussed with the patient that there may be a patient responsible charge related to this service. The patient expressed understanding and agreed to proceed.  Subjective:  Patient ID: Adrienne Cook, female    DOB: 1941/02/04, 78 y.o.   MRN: [id number]  Chief Complaint:  No chief complaint on file.   HPI: Adrienne Cook is a 78 y.o. female presenting on 12/24/2018 for No chief complaint on file.   Pt reports 3 days of LLQ abdominal pain. States her bowel movements have been less frequent than normal. States she has a history of diverticulitis and this feels the same. She states the pain is sharp and stabbing, 6/10. States it does not radiate anywhere. No fever, chills, weakness, confusion, diarrhea, or blood in stool. No changes in stool size or color. No abdominal distention. She states she does not watch her diet. No unexpected weight changes.    Relevant past medical, surgical, family, and social history reviewed and updated as indicated.  Allergies and medications reviewed and updated.   Past Medical History:  Diagnosis Date  . 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 Chest pain    a. normal cath in 2004;  b. nl stress echo 2011;  c. 02/2012 Cath: nl cors, EF 65%.  . DM (diabetes mellitus) ([place])    insulin dependent  . HTN (hypertension)   . Hypercholesterolemia   . Irregular heartbeat    a. normal event monitor  . Obesity   . PONV (postoperative nausea and vomiting)   . Seasonal allergies   . Sleep apnea    does not tolerate CPAP    Past Surgical History:  Procedure Laterality Date  . APPENDECTOMY    . BREAST CYST EXCISION     benign  . CARPAL TUNNEL RELEASE    . CHOLECYSTECTOMY    . COLONOSCOPY  02/13/2012  . EXAMINATION UNDER ANESTHESIA     and sphincterotomy  . HAMMER TOE SURGERY     bilateral; 5th toes  . LEFT HEART CATH  02/28/2012   Procedure: LEFT HEART CATH;  Surgeon: Hillary Bow, MD;  Location: [facility] CATH LAB;  Service: Cardiovascular;;  . LEFT HEART CATHETERIZATION WITH CORONARY ANGIOGRAM N/A 02/28/2012   Procedure: LEFT HEART CATHETERIZATION WITH CORONARY ANGIOGRAM;  Surgeon: Hillary Bow, MD;  Location: [facility] CATH LAB;  Service: Cardiovascular;  Laterality: N/A;  . VAGINAL HYSTERECTOMY  1978    Social History   Socioeconomic History  . Marital status: Married    Spouse name: Not on file  . Number of children: 1  . Years of education: 31  . Highest education level: 12th grade  Occupational History  . Occupation: Retired    Comment: Passenger transport manager  Social Needs  . Financial resource strain: Not hard at all  . Food insecurity:    Worry: Never true    Inability: Never true  . Transportation needs:  Medical: No    Non-medical: No  Tobacco Use  . Smoking status: Never Smoker  . Smokeless tobacco: Never Used  . Tobacco comment: tobacco use - no  Substance and Sexual Activity  . Alcohol use: No  . Drug use: No  . Sexual activity: Not Currently    Birth control/protection: Surgical    Comment: hyst  Lifestyle  . Physical activity:    Days per week: 0 days    Minutes per session: 0 min  . Stress: Only a little  Relationships  . Social connections:    Talks on phone: More than three times a week    Gets together: More than three times a week    Attends religious service: More than 4 times per year    Active member of club  or organization: Yes    Attends meetings of clubs or organizations: More than 4 times per year    Relationship status: Separated  . Intimate partner violence:    Fear of current or ex partner: No    Emotionally abused: No    Physically abused: No    Forced sexual activity: No  Other Topics Concern  . Not on file  Social History Narrative  . Not on file    Outpatient Encounter Medications as of 12/24/2018  Medication Sig  . aspirin 81 MG EC tablet Take 1 tablet (81 mg total) by mouth daily.  . calcium elemental as carbonate (BARIATRIC TUMS ULTRA) 400 MG chewable tablet Chew 1,000 mg by mouth 3 (three) times daily.  . Cholecalciferol (VITAMIN D3) 5000 UNITS TABS Take 5,000 Units by mouth daily.  . ciprofloxacin (CIPRO) 500 MG tablet Take 1 tablet (500 mg total) by mouth 2 (two) times daily for 7 days.  . citalopram (CELEXA) 40 MG tablet Take 1 tablet (40 mg total) by mouth daily.  . clotrimazole-betamethasone (LOTRISONE) cream Apply 1 application topically 2 (two) times daily.  [name] diltiazem (CARDIZEM CD) 240 MG 24 hr capsule TAKE ONE CAPSULE BY MOUTH ON AN EMPTY STOMACH IN THE MORNING  . fluticasone (FLONASE) 50 MCG/ACT nasal spray Place 2 sprays into both nostrils daily.  [name] gabapentin (NEURONTIN) 400 MG capsule TAKE 1 TO 3 CAPSULES BY MOUTH AT BEDTIME  . HUMULIN N 100 UNIT/ML injection INJECT 0.4 MLS (40 UNITS TOTAL) INTO THE SKIN 2 (TWO) TIMES DAILY.  [name] HUMULIN R 100 UNIT/ML injection INJECT 10 TO 20 UNITS SUBCUTANEOUSLY THREE TIMES DAILY BEFORE MEAL(S)  . HYDROcodone-homatropine (HYCODAN) 5-1.5 MG/5ML syrup Take 5-10 mLs by mouth every 6 (six) hours as needed.  . Insulin Syringe-Needle U-100 (INSULIN SYRINGE .5CC/30GX1/2") 30G X 1/2" 0.5 ML MISC INJECT 1 EACH INTO THE SKIN 5 (FIVE) TIMES DAILY.  [name] lisinopril-hydrochlorothiazide (PRINZIDE,ZESTORETIC) 20-25 MG tablet Take 1 tablet by mouth daily.  [name] loratadine (CLARITIN) 10 MG tablet Take 1 tablet (10 mg total) by mouth daily.  . meclizine  (ANTIVERT) 25 MG tablet Take 1 tablet (25 mg total) by mouth 3 (three) times daily as needed for dizziness.  . metFORMIN (GLUCOPHAGE) 1000 MG tablet TAKE 1 TABLET (1,000 MG TOTAL) BY MOUTH 2 (TWO) TIMES DAILY WITH A MEAL.  [name] metroNIDAZOLE (FLAGYL) 500 MG tablet Take 1 tablet (500 mg total) by mouth 2 (two) times daily.  . ONE TOUCH ULTRA TEST test strip TEST TWICE DAILY E11.9  . simvastatin (ZOCOR) 40 MG tablet TAKE 1 TABLET BY MOUTH EVERY DAY   No facility-administered encounter medications on file as of 12/24/2018.     No Known  Allergies  Review of Systems  Constitutional: Negative for activity change, appetite change, chills, diaphoresis, fatigue, fever and unexpected weight change.  Respiratory: Negative for cough and shortness of breath.   Cardiovascular: Negative for chest pain, palpitations and leg swelling.  Gastrointestinal: Positive for abdominal pain and constipation. Negative for abdominal distention, anal bleeding, blood in stool, diarrhea, nausea, rectal pain and vomiting.  Neurological: Negative for dizziness, syncope, weakness, light-headedness and headaches.  Psychiatric/Behavioral: Negative for confusion.  All other systems reviewed and are negative.        Observations/Objective: No vital signs or physical exam, this was a telephone or virtual health encounter.  Pt alert and oriented, answers all questions appropriately, and able to speak in full sentences.    Assessment and Plan: Diagnoses and all orders for this visit:  Diverticulitis Continue fiber and Miralax to facilitate regular bowel movements. Antibiotics as prescribed for diverticulitis flare. Report any new or worsening symptoms. Pt aware of signs and symptoms that require emergent evaluation.  -     ciprofloxacin (CIPRO) 500 MG tablet; Take 1 tablet (500 mg total) by mouth 2 (two) times daily for 7 days. -     metroNIDAZOLE (FLAGYL) 500 MG tablet; Take 1 tablet (500 mg total) by mouth 2 (two) times  daily.     Follow Up Instructions: Return if symptoms worsen or fail to improve.    I discussed the assessment and treatment plan with the patient. The patient was provided an opportunity to ask questions and all were answered. The patient agreed with the plan and demonstrated an understanding of the instructions.   The patient was advised to call back or seek an in-person evaluation if the symptoms worsen or if the condition fails to improve as anticipated.  The above assessment and management plan was discussed with the patient. The patient verbalized understanding of and has agreed to the management plan. Patient is aware to call the clinic if symptoms persist or worsen. Patient is aware when to return to the clinic for a follow-up visit. Patient educated on when it is appropriate to go to the emergency department.    I provided 15 minutes of non-face-to-face time during this encounter. The call started at 1445. The call ended at 1500.   Monia Pouch, FNP-C [facility] [address] [place], [place] [address] [phone]

## 2019-01-27 ENCOUNTER — Other Ambulatory Visit: Payer: Self-pay | Admitting: Physician Assistant

## 2019-01-27 DIAGNOSIS — E119 Type 2 diabetes mellitus without complications: Secondary | ICD-10-CM

## 2019-01-27 DIAGNOSIS — Z794 Long term (current) use of insulin: Secondary | ICD-10-CM

## 2019-03-04 ENCOUNTER — Other Ambulatory Visit: Payer: Self-pay | Admitting: Physician Assistant

## 2019-03-05 ENCOUNTER — Other Ambulatory Visit: Payer: Self-pay | Admitting: Physician Assistant

## 2019-03-05 ENCOUNTER — Other Ambulatory Visit: Payer: Self-pay

## 2019-03-05 DIAGNOSIS — E119 Type 2 diabetes mellitus without complications: Secondary | ICD-10-CM

## 2019-03-05 DIAGNOSIS — Z794 Long term (current) use of insulin: Secondary | ICD-10-CM

## 2019-03-08 ENCOUNTER — Encounter: Payer: Self-pay | Admitting: Physician Assistant

## 2019-03-08 ENCOUNTER — Other Ambulatory Visit: Payer: Self-pay

## 2019-03-08 ENCOUNTER — Ambulatory Visit (INDEPENDENT_AMBULATORY_CARE_PROVIDER_SITE_OTHER): Payer: Medicare Other | Admitting: Physician Assistant

## 2019-03-08 VITALS — BP 137/72 | HR 82 | Temp 98.6°F | Ht 66.0 in | Wt 218.0 lb

## 2019-03-08 DIAGNOSIS — N3281 Overactive bladder: Secondary | ICD-10-CM | POA: Diagnosis not present

## 2019-03-08 DIAGNOSIS — F320 Major depressive disorder, single episode, mild: Secondary | ICD-10-CM

## 2019-03-08 DIAGNOSIS — E1065 Type 1 diabetes mellitus with hyperglycemia: Secondary | ICD-10-CM | POA: Diagnosis not present

## 2019-03-08 DIAGNOSIS — E7800 Pure hypercholesterolemia, unspecified: Secondary | ICD-10-CM | POA: Diagnosis not present

## 2019-03-08 LAB — CMP14+EGFR
ALT: 16 IU/L (ref 0–32)
AST: 18 IU/L (ref 0–40)
Albumin/Globulin Ratio: 1.8 (ref 1.2–2.2)
Albumin: 4.2 g/dL (ref 3.7–4.7)
Alkaline Phosphatase: 71 IU/L (ref 39–117)
BUN/Creatinine Ratio: 22 (ref 12–28)
BUN: 22 mg/dL (ref 8–27)
Bilirubin Total: 0.3 mg/dL (ref 0.0–1.2)
CO2: 22 mmol/L (ref 20–29)
Calcium: 9.6 mg/dL (ref 8.7–10.3)
Chloride: 101 mmol/L (ref 96–106)
Creatinine, Ser: 1.02 mg/dL — ABNORMAL HIGH (ref 0.57–1.00)
GFR calc Af Amer: 62 mL/min/{1.73_m2} (ref >59)
GFR calc non Af Amer: 54 mL/min/{1.73_m2} — ABNORMAL LOW (ref >59)
Globulin, Total: 2.3 g/dL (ref 1.5–4.5)
Glucose: 222 mg/dL — ABNORMAL HIGH (ref 65–99)
Potassium: 4.9 mmol/L (ref 3.5–5.2)
Sodium: 138 mmol/L (ref 134–144)
Total Protein: 6.5 g/dL (ref 6.0–8.5)

## 2019-03-08 LAB — BAYER DCA HB A1C WAIVED: HB A1C (BAYER DCA - WAIVED): 7.5 % — ABNORMAL HIGH (ref <7.0)

## 2019-03-08 MED ORDER — SIMVASTATIN 40 MG PO TABS: 40.0000 mg | ORAL_TABLET | Freq: Every day | ORAL | 3 refills | Status: DC

## 2019-03-08 MED ORDER — CITALOPRAM HYDROBROMIDE 40 MG PO TABS: 40.0000 mg | ORAL_TABLET | Freq: Every day | ORAL | 3 refills | Status: DC

## 2019-03-08 MED ORDER — INSULIN REGULAR HUMAN 100 UNIT/ML IJ SOLN: INTRAMUSCULAR | 11 refills | Status: DC

## 2019-03-08 MED ORDER — OXYBUTYNIN CHLORIDE ER 10 MG PO TB24: 10.0000 mg | ORAL_TABLET | Freq: Every day | ORAL | 5 refills | Status: DC

## 2019-03-08 MED ORDER — INSULIN NPH (HUMAN) (ISOPHANE) 100 UNIT/ML ~~LOC~~ SUSP: SUBCUTANEOUS | 5 refills | Status: DC

## 2019-03-08 MED ORDER — METFORMIN HCL 1000 MG PO TABS: 1000.0000 mg | ORAL_TABLET | Freq: Two times a day (BID) | ORAL | 3 refills | Status: DC

## 2019-03-08 NOTE — Patient Instructions (Signed)

## 2019-03-09 ENCOUNTER — Encounter: Payer: Self-pay | Admitting: Physician Assistant

## 2019-03-09 DIAGNOSIS — N3281 Overactive bladder: Secondary | ICD-10-CM | POA: Insufficient documentation

## 2019-03-09 DIAGNOSIS — F320 Major depressive disorder, single episode, mild: Secondary | ICD-10-CM | POA: Insufficient documentation

## 2019-03-09 DIAGNOSIS — E1040 Type 1 diabetes mellitus with diabetic neuropathy, unspecified: Secondary | ICD-10-CM | POA: Insufficient documentation

## 2019-03-09 DIAGNOSIS — E1065 Type 1 diabetes mellitus with hyperglycemia: Secondary | ICD-10-CM | POA: Insufficient documentation

## 2019-03-09 DIAGNOSIS — F330 Major depressive disorder, recurrent, mild: Secondary | ICD-10-CM | POA: Insufficient documentation

## 2019-03-09 NOTE — Progress Notes (Signed)
BP 137/72   Pulse 82   Temp 98.6 F (37 C) (Oral)   Ht '5\' 6"'$  (1.676 m)   Wt 218 lb (98.9 kg)   BMI 35.19 kg/m    Subjective:    Patient ID: Adrienne Cook, female    DOB: 11/07/1940, 78 y.o.   MRN: [id number]  HPI: Adrienne Cook is a 78 y.o. female presenting on 03/08/2019 for Diabetes (3 month ) and Medical Management of Chronic Issues  Patient is here for recheck on her diabetes and high cholesterol.  She also is for recheck on her depression.  She has 1 new problem of frequent bladder and sometimes difficulty holding it.  She will have stress incontinence where she jumps or coughs and have leakage.  She also will have urge incontinence when she is trying to get to the bathroom she is not able to control it.  She has never taken any medication for this before.  She states her diabetes is probably going to be a little more out of control.  With COVID-19 restriction she has stayed at home and she knows she is much less active and has been eating much more.  She states that otherwise she is doing fairly well and not having any difficulties.  Depression screen [facility] 2/9 03/08/2019 12/03/2018 09/04/2018 06/29/2018 06/03/2018  Decreased Interest 0 0 0 1 2  Down, Depressed, Hopeless 0 0 0 1 1  PHQ - 2 Score 0 0 0 2 3  Altered sleeping - - - 0 0  Tired, decreased energy - - - 0 3  Change in appetite - - - 0 1  Feeling bad or failure about yourself  - - - 0 0  Trouble concentrating - - - 0 0  Moving slowly or fidgety/restless - - - 0 0  Suicidal thoughts - - - 0 0  PHQ-9 Score - - - 2 7  Difficult doing work/chores - - - Somewhat difficult -     Past Medical History:  Diagnosis Date  . 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 Chest pain    a. normal cath in 2004;  b. nl stress echo 2011;  c. 02/2012 Cath: nl cors, EF 65%.  . DM (diabetes mellitus) ([place])    insulin dependent  . HTN (hypertension)   . Hypercholesterolemia   . Irregular heartbeat    a. normal event monitor  . Obesity   . PONV  (postoperative nausea and vomiting)   . Seasonal allergies   . Sleep apnea    does not tolerate CPAP   Relevant past medical, surgical, family and social history reviewed and updated as indicated. Interim medical history since our last visit reviewed. Allergies and medications reviewed and updated. DATA REVIEWED: CHART IN EPIC  Family History reviewed for pertinent findings.  Review of Systems  Constitutional: Negative.   HENT: Negative.   Eyes: Negative.   Respiratory: Negative.   Gastrointestinal: Negative.   Genitourinary: Negative.     Allergies as of 03/08/2019   No Known Allergies     Medication List       Accurate as of March 08, 2019 11:59 PM. If you have any questions, ask your nurse or doctor.        STOP taking these medications   HYDROcodone-homatropine 5-1.5 MG/5ML syrup Commonly known as: HYCODAN Stopped by: Terald Sleeper, PA-C     TAKE these medications   aspirin 81 MG EC tablet Take 1 tablet (81 mg total)  by mouth daily.   calcium elemental as carbonate 400 MG chewable tablet Commonly known as: BARIATRIC TUMS ULTRA Chew 1,000 mg by mouth 3 (three) times daily.   citalopram 40 MG tablet Commonly known as: CELEXA Take 1 tablet (40 mg total) by mouth daily.   clotrimazole-betamethasone cream Commonly known as: LOTRISONE Apply 1 application topically 2 (two) times daily.   diltiazem 240 MG 24 hr capsule Commonly known as: CARDIZEM CD TAKE ONE CAPSULE BY MOUTH ON AN EMPTY STOMACH IN THE MORNING   fluticasone 50 MCG/ACT nasal spray Commonly known as: FLONASE Place 2 sprays into both nostrils daily.   gabapentin 400 MG capsule Commonly known as: NEURONTIN TAKE 1 TO 3 CAPSULES BY MOUTH AT BEDTIME   insulin NPH Human 100 UNIT/ML injection Commonly known as: HumuLIN N INJECT 0.4 MLS (40 UNITS TOTAL) INTO THE SKIN 2 (TWO) TIMES DAILY.   insulin regular 100 units/mL injection Commonly known as: HumuLIN R INJECT 10 TO 20 UNITS SUBCUTANEOUSLY  THREE TIMES DAILY BEFORE MEAL(S) What changed: medication strength Changed by: Terald Sleeper, PA-C   INSULIN SYRINGE .5CC/30GX1/2" 30G X 1/2" 0.5 ML Misc INJECT 1 EACH INTO THE SKIN 5 (FIVE) TIMES DAILY.   lisinopril-hydrochlorothiazide 20-25 MG tablet Commonly known as: ZESTORETIC Take 1 tablet by mouth daily.   loratadine 10 MG tablet Commonly known as: CLARITIN Take 1 tablet (10 mg total) by mouth daily.   meclizine 25 MG tablet Commonly known as: ANTIVERT Take 1 tablet (25 mg total) by mouth 3 (three) times daily as needed for dizziness.   metFORMIN 1000 MG tablet Commonly known as: GLUCOPHAGE Take 1 tablet (1,000 mg total) by mouth 2 (two) times daily with a meal.   metroNIDAZOLE 500 MG tablet Commonly known as: Flagyl Take 1 tablet (500 mg total) by mouth 2 (two) times daily.   ONE TOUCH ULTRA TEST test strip Generic drug: glucose blood TEST TWICE DAILY E11.9   oxybutynin 10 MG 24 hr tablet Commonly known as: DITROPAN-XL Take 1 tablet (10 mg total) by mouth at bedtime. Started by: Terald Sleeper, PA-C   simvastatin 40 MG tablet Commonly known as: ZOCOR Take 1 tablet (40 mg total) by mouth daily.   Vitamin D3 125 MCG (5000 UT) Tabs Take 5,000 Units by mouth daily.          Objective:    BP 137/72   Pulse 82   Temp 98.6 F (37 C) (Oral)   Ht '5\' 6"'$  (1.676 m)   Wt 218 lb (98.9 kg)   BMI 35.19 kg/m   No Known Allergies  Wt Readings from Last 3 Encounters:  03/08/19 218 lb (98.9 kg)  12/03/18 214 lb (97.1 kg)  09/04/18 211 lb 9.6 oz (96 kg)    Physical Exam Constitutional:      Appearance: She is well-developed.  HENT:     Head: Normocephalic and atraumatic.  Eyes:     Conjunctiva/sclera: Conjunctivae normal.     Pupils: Pupils are equal, round, and reactive to light.  Cardiovascular:     Rate and Rhythm: Normal rate and regular rhythm.     Heart sounds: Normal heart sounds.  Pulmonary:     Effort: Pulmonary effort is normal.  Skin:     General: Skin is warm and dry.     Findings: No rash.  Neurological:     Mental Status: She is alert and oriented to person, place, and time.     Deep Tendon Reflexes: Reflexes are normal and symmetric.  Psychiatric:        Behavior: Behavior normal.     Results for orders placed or performed in visit on 03/08/19  Bayer DCA Hb A1c Waived  Result Value Ref Range   HB A1C (BAYER DCA - WAIVED) 7.5 (H) <7.0 %  CMP14+EGFR  Result Value Ref Range   Glucose 222 (H) 65 - 99 mg/dL   BUN 22 8 - 27 mg/dL   Creatinine, Ser 1.02 (H) 0.57 - 1.00 mg/dL   GFR calc non Af Amer 54 (L) >59 mL/min/1.73   GFR calc Af Amer 62 >59 mL/min/1.73   BUN/Creatinine Ratio 22 12 - 28   Sodium 138 134 - 144 mmol/L   Potassium 4.9 3.5 - 5.2 mmol/L   Chloride 101 96 - 106 mmol/L   CO2 22 20 - 29 mmol/L   Calcium 9.6 8.7 - 10.3 mg/dL   Total Protein 6.5 6.0 - 8.5 g/dL   Albumin 4.2 3.7 - 4.7 g/dL   Globulin, Total 2.3 1.5 - 4.5 g/dL   Albumin/Globulin Ratio 1.8 1.2 - 2.2   Bilirubin Total 0.3 0.0 - 1.2 mg/dL   Alkaline Phosphatase 71 39 - 117 IU/L   AST 18 0 - 40 IU/L   ALT 16 0 - 32 IU/L      Assessment & Plan:   1. Type 2 diabetes mellitus without complication, with long-term current use of insulin (HCC) - metFORMIN (GLUCOPHAGE) 1000 MG tablet; Take 1 tablet (1,000 mg total) by mouth 2 (two) times daily with a meal.  Dispense: 180 tablet; Refill: 3 - insulin regular (HUMULIN R) 100 units/mL injection; INJECT 10 TO 20 UNITS SUBCUTANEOUSLY THREE TIMES DAILY BEFORE MEAL(S)  Dispense: 30 mL; Refill: 11 - insulin NPH Human (HUMULIN N) 100 UNIT/ML injection; INJECT 0.4 MLS (40 UNITS TOTAL) INTO THE SKIN 2 (TWO) TIMES DAILY.  Dispense: 70 mL; Refill: 5 - Bayer DCA Hb A1c Waived - CMP14+EGFR  2. HYPERCHOLESTEROLEMIA - simvastatin (ZOCOR) 40 MG tablet; Take 1 tablet (40 mg total) by mouth daily.  Dispense: 90 tablet; Refill: 3  3. Depression, major, single episode, mild (HCC) - citalopram (CELEXA) 40 MG  tablet; Take 1 tablet (40 mg total) by mouth daily.  Dispense: 90 tablet; Refill: 3  4. OAB (overactive bladder) - oxybutynin (DITROPAN-XL) 10 MG 24 hr tablet; Take 1 tablet (10 mg total) by mouth at bedtime.  Dispense: 30 tablet; Refill: 5   Continue all other maintenance medications as listed above.  Follow up plan: No follow-ups on file.  Educational handout given for [place] PA-C [facility] [address]  [place], [place] [address] [phone]   03/09/2019, 5:24 PM

## 2019-03-31 ENCOUNTER — Other Ambulatory Visit: Payer: Self-pay | Admitting: Physician Assistant

## 2019-03-31 DIAGNOSIS — R928 Other abnormal and inconclusive findings on diagnostic imaging of breast: Secondary | ICD-10-CM

## 2019-05-02 ENCOUNTER — Other Ambulatory Visit: Payer: Self-pay | Admitting: Physician Assistant

## 2019-05-04 ENCOUNTER — Other Ambulatory Visit: Payer: Self-pay | Admitting: Physician Assistant

## 2019-05-15 ENCOUNTER — Other Ambulatory Visit: Payer: Self-pay | Admitting: Physician Assistant

## 2019-06-02 ENCOUNTER — Other Ambulatory Visit: Payer: Self-pay | Admitting: Physician Assistant

## 2019-06-02 DIAGNOSIS — E1065 Type 1 diabetes mellitus with hyperglycemia: Secondary | ICD-10-CM

## 2019-06-09 ENCOUNTER — Encounter: Payer: Self-pay | Admitting: Physician Assistant

## 2019-06-09 ENCOUNTER — Ambulatory Visit (INDEPENDENT_AMBULATORY_CARE_PROVIDER_SITE_OTHER): Payer: Medicare Other | Admitting: Physician Assistant

## 2019-06-09 ENCOUNTER — Ambulatory Visit: Payer: Medicare Other | Admitting: Physician Assistant

## 2019-06-09 DIAGNOSIS — E1065 Type 1 diabetes mellitus with hyperglycemia: Secondary | ICD-10-CM | POA: Diagnosis not present

## 2019-06-09 DIAGNOSIS — I10 Essential (primary) hypertension: Secondary | ICD-10-CM | POA: Diagnosis not present

## 2019-06-09 MED ORDER — LISINOPRIL-HYDROCHLOROTHIAZIDE 20-25 MG PO TABS: 1.0000 | ORAL_TABLET | Freq: Every day | ORAL | 3 refills | Status: DC

## 2019-06-10 ENCOUNTER — Telehealth: Payer: Self-pay | Admitting: *Deleted

## 2019-06-10 MED ORDER — FREESTYLE LIBRE 14 DAY SENSOR MISC: 1.0000 | 11 refills | Status: DC

## 2019-06-10 MED ORDER — FREESTYLE LIBRE 14 DAY READER DEVI: 1.0000 | 2 refills | Status: DC

## 2019-06-10 NOTE — Progress Notes (Signed)
Telephone visit  Subjective: CC: Recheck on diabetes and hypertension PCP: Terald Sleeper, PA-C Adrienne Cook is a 78 y.o. female calls for telephone consult today. Patient provides verbal consent for consult held via phone.  Patient is identified with 2 separate identifiers.  At this time the entire area is on COVID-19 social distancing and stay home orders are in place.  Patient is of higher risk and therefore we are performing this by a virtual method.  Location of patient: Home a Location of provider: HOME Others present for call: No  This patient is having a periodic recheck on her chronic medical conditions which do include type 1 diabetes and hypertension.  We reviewed her lab and her last A1c had gone up slightly.  She had made some adjustments and is feeling better about her blood sugars.  Most of the time it will be around 150 in the morning.  It has been as high as 180.    At this time she is doing 5 injections/day.  She is doing NPH twice daily using 40 units in the morning and 42 units in the evening.  She is using our insulin in each meal generally around 20 you not each time.  She states that she has not really been adjusting the insulin at her meals in accordance to what amount of carbs she has eaten.  I have asked her to keep track over the next 2 weeks of her postprandials.  And the ones that she finds where she has most elevated to increase that mealtime insulin to up to 25 units.  And she understands the plan.  Because of her complication of plan and using more than 4 in injection per day I am going to send in a prescription for a LIBRE system.     ROS: Per HPI  No Known Allergies Past Medical History:  Diagnosis Date  . 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 Hypercholesterolemia   . Irregular heartbeat    a. normal event monitor  . Obesity   . PONV (postoperative nausea and vomiting)   . Seasonal allergies   . Sleep apnea    does not tolerate CPAP    Current  Outpatient Medications:  .  aspirin 81 MG EC tablet, Take 1 tablet (81 mg total) by mouth daily., Disp: , Rfl:  .  calcium elemental as carbonate (BARIATRIC TUMS ULTRA) 400 MG chewable tablet, Chew 1,000 mg by mouth 3 (three) times daily., Disp: , Rfl:  .  Cholecalciferol (VITAMIN D3) 5000 UNITS TABS, Take 5,000 Units by mouth daily., Disp: , Rfl:  .  citalopram (CELEXA) 40 MG tablet, Take 1 tablet (40 mg total) by mouth daily., Disp: 90 tablet, Rfl: 3 .  clotrimazole-betamethasone (LOTRISONE) cream, Apply 1 application topically 2 (two) times daily., Disp: 30 g, Rfl: 2 .  diltiazem (CARDIZEM CD) 240 MG 24 hr capsule, TAKE ONE CAPSULE BY MOUTH ON AN EMPTY STOMACH IN THE MORNING, Disp: 90 capsule, Rfl: 3 .  fluticasone (FLONASE) 50 MCG/ACT nasal spray, Place 2 sprays into both nostrils daily., Disp: 16 g, Rfl: 6 .  gabapentin (NEURONTIN) 400 MG capsule, TAKE 1 TO 3 CAPSULES BY MOUTH AT BEDTIME, Disp: 270 capsule, Rfl: 3 .  insulin NPH Human (HUMULIN N) 100 UNIT/ML injection, INJECT 0.4 MLS (40 UNITS TOTAL) INTO THE SKIN 2 (TWO) TIMES DAILY., Disp: 70 mL, Rfl: 5 .  insulin regular (HUMULIN R) 100 units/mL injection, INJECT 10 TO 20 UNITS SUBCUTANEOUSLY THREE  TIMES DAILY BEFORE MEAL(S), Disp: 30 mL, Rfl: 0 .  Insulin Syringe-Needle U-100 (INSULIN SYRINGE .5CC/30GX1/2") 30G X 1/2" 0.5 ML MISC, INJECT 1 EACH INTO THE SKIN 5 (FIVE) TIMES DAILY., Disp: 300 each, Rfl: 10 .  lisinopril-hydrochlorothiazide (ZESTORETIC) 20-25 MG tablet, Take 1 tablet by mouth daily., Disp: 90 tablet, Rfl: 3 .  loratadine (CLARITIN) 10 MG tablet, Take 1 tablet (10 mg total) by mouth daily., Disp: 90 tablet, Rfl: 3 .  meclizine (ANTIVERT) 25 MG tablet, Take 1 tablet (25 mg total) by mouth 3 (three) times daily as needed for dizziness., Disp: 30 tablet, Rfl: 5 .  metFORMIN (GLUCOPHAGE) 1000 MG tablet, Take 1 tablet (1,000 mg total) by mouth 2 (two) times daily with a meal., Disp: 180 tablet, Rfl: 3 .  metroNIDAZOLE (FLAGYL) 500 MG  tablet, Take 1 tablet (500 mg total) by mouth 2 (two) times daily., Disp: 14 tablet, Rfl: 0 .  ONETOUCH ULTRA test strip, TEST TWICE DAILY E11.9, Disp: 200 strip, Rfl: 3 .  oxybutynin (DITROPAN-XL) 10 MG 24 hr tablet, Take 1 tablet (10 mg total) by mouth at bedtime., Disp: 30 tablet, Rfl: 5 .  simvastatin (ZOCOR) 40 MG tablet, Take 1 tablet (40 mg total) by mouth daily., Disp: 90 tablet, Rfl: 3  Assessment/ Plan: 78 y.o. female   1. Essential hypertension - lisinopril-hydrochlorothiazide (ZESTORETIC) 20-25 MG tablet; Take 1 tablet by mouth daily.  Dispense: 90 tablet; Refill: 3 - CMP14+EGFR; Future - Bayer DCA Hb A1c Waived; Future  2. Type 1 diabetes mellitus with hyperglycemia (HCC) - lisinopril-hydrochlorothiazide (ZESTORETIC) 20-25 MG tablet; Take 1 tablet by mouth daily.  Dispense: 90 tablet; Refill: 3 - CMP14+EGFR; Future - Bayer DCA Hb A1c Waived; Future   No follow-ups on file.  Continue all other maintenance medications as listed above.  Start time: 10:12 AM End time: 30 a.m.  Meds ordered this encounter  Medications  . lisinopril-hydrochlorothiazide (ZESTORETIC) 20-25 MG tablet    Sig: Take 1 tablet by mouth daily.    Dispense:  90 tablet    Refill:  3    Order Specific Question:   Supervising Provider    Answer:   Janora Norlander [[id number]]    Particia Nearing PA-C [place] [phone]

## 2019-06-10 NOTE — Telephone Encounter (Signed)
Patient needs prior auth on free style libre. [facility] tried to send pa over

## 2019-06-11 NOTE — Telephone Encounter (Signed)
Prior Auth for [employer] 14 day reader-In Process  Key: [id number] -   PA Case ID: [id number]    Prior Auth for [employer] 14 day sensor-In Process  Key: [id number] -   PA Case ID: [id number] Need

## 2019-06-15 NOTE — Telephone Encounter (Signed)
Prior Auth for [place] DENIED.   The requested device is not a Part-D eligible product as defined by the Medicare Part-D benefit and is not covered under the Part-D prescription drug plan. Coverage may be available as a Part-B medical Durable Medical Equipment benefit. Please call Durable Medical Equipment (DME) vendors [employer] at [phone] or [employer] at [phone] for assistance obtaining this device.   Will try to contact pt to see what she would like to do. If she would like to go through DME vendors I will give her the contact information for her to call and start the process.  TTC pt and it kept ringing, NA and no VM.

## 2019-06-18 NOTE — Telephone Encounter (Signed)
TTC pt without a return call in over 3 days, will close encounter.

## 2019-06-22 ENCOUNTER — Ambulatory Visit: Payer: Medicare Other

## 2019-06-22 ENCOUNTER — Other Ambulatory Visit: Payer: Medicare Other

## 2019-06-22 ENCOUNTER — Other Ambulatory Visit: Payer: Self-pay

## 2019-06-22 DIAGNOSIS — E1065 Type 1 diabetes mellitus with hyperglycemia: Secondary | ICD-10-CM

## 2019-06-22 DIAGNOSIS — I10 Essential (primary) hypertension: Secondary | ICD-10-CM

## 2019-06-22 LAB — CMP14+EGFR
ALT: 15 IU/L (ref 0–32)
AST: 19 IU/L (ref 0–40)
Albumin/Globulin Ratio: 1.8 (ref 1.2–2.2)
Albumin: 4.3 g/dL (ref 3.7–4.7)
Alkaline Phosphatase: 75 IU/L (ref 39–117)
BUN/Creatinine Ratio: 20 (ref 12–28)
BUN: 20 mg/dL (ref 8–27)
Bilirubin Total: 0.3 mg/dL (ref 0.0–1.2)
CO2: 21 mmol/L (ref 20–29)
Calcium: 9.4 mg/dL (ref 8.7–10.3)
Chloride: 103 mmol/L (ref 96–106)
Creatinine, Ser: 0.99 mg/dL (ref 0.57–1.00)
GFR calc Af Amer: 64 mL/min/{1.73_m2} (ref >59)
GFR calc non Af Amer: 56 mL/min/{1.73_m2} — ABNORMAL LOW (ref >59)
Globulin, Total: 2.4 g/dL (ref 1.5–4.5)
Glucose: 117 mg/dL — ABNORMAL HIGH (ref 65–99)
Potassium: 4.8 mmol/L (ref 3.5–5.2)
Sodium: 140 mmol/L (ref 134–144)
Total Protein: 6.7 g/dL (ref 6.0–8.5)

## 2019-06-22 LAB — BAYER DCA HB A1C WAIVED: HB A1C (BAYER DCA - WAIVED): 7.3 % — ABNORMAL HIGH (ref <7.0)

## 2019-07-01 ENCOUNTER — Ambulatory Visit (INDEPENDENT_AMBULATORY_CARE_PROVIDER_SITE_OTHER): Payer: Medicare Other | Admitting: *Deleted

## 2019-07-01 DIAGNOSIS — Z0000 Encounter for general adult medical examination without abnormal findings: Secondary | ICD-10-CM

## 2019-07-01 NOTE — Progress Notes (Signed)
MEDICARE ANNUAL WELLNESS VISIT  07/01/2019  Telephone Visit Disclaimer This Medicare AWV was conducted by telephone due to national recommendations for restrictions regarding the COVID-19 Pandemic (e.g. social distancing).  I verified, using two identifiers, that I am speaking with [name] or their authorized healthcare agent. I discussed the limitations, risks, security, and privacy concerns of performing an evaluation and management service by telephone and the potential availability of an in-person appointment in the future. The patient expressed understanding and agreed to proceed.   Subjective:  SHALYA GRUBER is a 78 y.o. female patient of Terald Sleeper, PA-C who had a Medicare Annual Wellness Visit today via telephone. Tavita is Retired and lives with their daughter. she has 1 child. she reports that she is socially active and does interact with friends/family regularly. she is minimally physically active and enjoys making wreaths and bows and reading.  Patient Care Team: Theodoro Clock as PCP - General (General Practice)  Advanced Directives 07/01/2019 12/25/2016 10/27/2014 02/28/2012  Does Patient Have a Medical Advance Directive? No No No Patient does not have advance directive  Would patient like information on creating a medical advance directive? No - Patient declined - No - patient declined information -  Pre-existing out of facility DNR order (yellow form or pink MOST form) - - - No    Hospital Utilization Over the Past 12 Months: # of hospitalizations or ER visits: 0 # of surgeries: 0  Review of Systems    Patient reports that her overall health is unchanged compared to last year.  History obtained from chart review  Patient Reported Readings (BP, Pulse, CBG, Weight, etc) none  Pain Assessment Pain : No/denies pain     Current Medications & Allergies (verified) Allergies as of 07/01/2019   No Known Allergies     Medication List       Accurate as of July 01, 2019  8:49 AM. If you have any questions, ask your nurse or doctor.        STOP taking these medications   clotrimazole-betamethasone cream Commonly known as: LOTRISONE   metroNIDAZOLE 500 MG tablet Commonly known as: Flagyl     TAKE these medications   aspirin 81 MG EC tablet Take 1 tablet (81 mg total) by mouth daily.   calcium elemental as carbonate 400 MG chewable tablet Commonly known as: BARIATRIC TUMS ULTRA Chew 1,000 mg by mouth 3 (three) times daily.   citalopram 40 MG tablet Commonly known as: CELEXA Take 1 tablet (40 mg total) by mouth daily.   diltiazem 240 MG 24 hr capsule Commonly known as: CARDIZEM CD TAKE ONE CAPSULE BY MOUTH ON AN EMPTY STOMACH IN THE MORNING   fluticasone 50 MCG/ACT nasal spray Commonly known as: FLONASE Place 2 sprays into both nostrils daily.   FreeStyle Libre 14 Day Reader Kerrin Mo 1 each by Does not apply route continuous.   FreeStyle Libre 14 Day Sensor Misc 1 each by Does not apply route continuous.   gabapentin 400 MG capsule Commonly known as: NEURONTIN TAKE 1 TO 3 CAPSULES BY MOUTH AT BEDTIME   insulin NPH Human 100 UNIT/ML injection Commonly known as: HumuLIN N INJECT 0.4 MLS (40 UNITS TOTAL) INTO THE SKIN 2 (TWO) TIMES DAILY.   insulin regular 100 units/mL injection Commonly known as: HumuLIN R INJECT 10 TO 20 UNITS SUBCUTANEOUSLY THREE TIMES DAILY BEFORE MEAL(S)   INSULIN SYRINGE .5CC/30GX1/2" 30G X 1/2" 0.5 ML Misc INJECT 1 EACH INTO THE SKIN  5 (FIVE) TIMES DAILY.   lisinopril-hydrochlorothiazide 20-25 MG tablet Commonly known as: ZESTORETIC Take 1 tablet by mouth daily.   loratadine 10 MG tablet Commonly known as: CLARITIN Take 1 tablet (10 mg total) by mouth daily.   meclizine 25 MG tablet Commonly known as: ANTIVERT Take 1 tablet (25 mg total) by mouth 3 (three) times daily as needed for dizziness.   metFORMIN 1000 MG tablet Commonly known as: GLUCOPHAGE Take 1 tablet (1,000 mg  total) by mouth 2 (two) times daily with a meal.   OneTouch Ultra test strip Generic drug: glucose blood TEST TWICE DAILY E11.9   oxybutynin 10 MG 24 hr tablet Commonly known as: DITROPAN-XL Take 1 tablet (10 mg total) by mouth at bedtime.   simvastatin 40 MG tablet Commonly known as: ZOCOR Take 1 tablet (40 mg total) by mouth daily.   Vitamin D3 125 MCG (5000 UT) Tabs Take 5,000 Units by mouth daily.       History (reviewed): Past Medical History:  Diagnosis Date  . 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 Diabetes mellitus without complication ([place])   . Hypercholesterolemia   . Hypertension   . Irregular heartbeat    a. normal event monitor  . Obesity   . PONV (postoperative nausea and vomiting)   . Seasonal allergies   . Sleep apnea    does not tolerate CPAP   Past Surgical History:  Procedure Laterality Date  . APPENDECTOMY    . BREAST CYST EXCISION     benign  . CARPAL TUNNEL RELEASE    . CHOLECYSTECTOMY    . COLONOSCOPY  02/13/2012  . EXAMINATION UNDER ANESTHESIA     and sphincterotomy  . HAMMER TOE SURGERY     bilateral; 5th toes  . LEFT HEART CATH  02/28/2012   Procedure: LEFT HEART CATH;  Surgeon: Hillary Bow, MD;  Location: [facility] CATH LAB;  Service: Cardiovascular;;  . LEFT HEART CATHETERIZATION WITH CORONARY ANGIOGRAM N/A 02/28/2012   Procedure: LEFT HEART CATHETERIZATION WITH CORONARY ANGIOGRAM;  Surgeon: Hillary Bow, MD;  Location: [facility] CATH LAB;  Service: Cardiovascular;  Laterality: N/A;  . VAGINAL HYSTERECTOMY  1978   Family History  Problem Relation Age of Onset  . Heart failure Mother        CHF   . COPD Father   . Cancer Brother        prostate  . Kidney Stones Brother   . Epilepsy Daughter   . Kidney Stones Daughter   . Colon cancer Neg Hx   . Stomach cancer Neg Hx    Social History   Socioeconomic History  . Marital status: Married    Spouse name: Not on file  . Number of children: 1  . Years of education: 58  . Highest education  level: 12th grade  Occupational History  . Occupation: Retired    Comment: Passenger transport manager  Social Needs  . Financial resource strain: Not hard at all  . Food insecurity    Worry: Never true    Inability: Never true  . Transportation needs    Medical: No    Non-medical: No  Tobacco Use  . Smoking status: Never Smoker  . Smokeless tobacco: Never Used  . Tobacco comment: tobacco use - no  Substance and Sexual Activity  . Alcohol use: No  . Drug use: No  . Sexual activity: Not Currently    Birth control/protection: Surgical    Comment: hyst  Lifestyle  . Physical activity  Days per week: 0 days    Minutes per session: 0 min  . Stress: Only a little  Relationships  . Social connections    Talks on phone: More than three times a week    Gets together: More than three times a week    Attends religious service: More than 4 times per year    Active member of club or organization: Yes    Attends meetings of clubs or organizations: More than 4 times per year    Relationship status: Separated  Other Topics Concern  . Not on file  Social History Narrative  . Not on file    Activities of Daily Living In your present state of health, do you have any difficulty performing the following activities: 07/01/2019  Hearing? N  Vision? N  Comment wears glasses-gets yearly eye exam  Difficulty concentrating or making decisions? N  Walking or climbing stairs? N  Dressing or bathing? N  Doing errands, shopping? N  Preparing Food and eating ? N  Using the Toilet? N  In the past six months, have you accidently leaked urine? Y  Comment stress incontinence-does not wear depends/pad while at home but will wear a pad if she has to go somewhere and doesn't know where the bathrooms are  Do you have problems with loss of bowel control? N  Managing your Medications? N  Managing your Finances? N  Housekeeping or managing your Housekeeping? N  Some recent data might be hidden     Patient Education/ Literacy How often do you need to have someone help you when you read instructions, pamphlets, or other written materials from your doctor or pharmacy?: 1 - Never What is the last grade level you completed in school?: 12th grade  Exercise Current Exercise Habits: The patient does not participate in regular exercise at present, Exercise limited by: None identified  Diet Patient reports consuming 2 meals a day and 1 snack(s) a day Patient reports that her primary diet is: Regular Patient reports that she does have regular access to food.   Depression Screen PHQ 2/9 Scores 07/01/2019 03/08/2019 12/03/2018 09/04/2018 06/29/2018 06/03/2018 03/03/2018  PHQ - 2 Score 0 0 0 0 2 3 1   PHQ- 9 Score - - - - 2 7 -     Fall Risk Fall Risk  07/01/2019 03/08/2019 12/03/2018 09/04/2018 06/29/2018  Falls in the past year? 0 0 0 0 No  Number falls in past yr: 0 - - - -  Injury with Fall? 0 - - - -  Comment - - - - -  Risk Factor Category  - - - - -  Follow up Falls prevention discussed - - - -  Comment No throw rugs in the house, adequate lighting in the walkways and grab bars in the bathroom. - - - -     Objective:  [name] seemed alert and oriented and she participated appropriately during our telephone visit.  Blood Pressure Weight BMI  BP Readings from Last 3 Encounters:  03/08/19 137/72  12/03/18 136/65  09/04/18 133/67   Wt Readings from Last 3 Encounters:  03/08/19 218 lb (98.9 kg)  12/03/18 214 lb (97.1 kg)  09/04/18 211 lb 9.6 oz (96 kg)   BMI Readings from Last 1 Encounters:  03/08/19 35.19 kg/m    *Unable to obtain current vital signs, weight, and BMI due to telephone visit type  Hearing/Vision  . Crosby did not seem to have difficulty with hearing/understanding during the telephone  conversation . Reports that she has had a formal eye exam by an eye care professional within the past year . Reports that she has not had a formal hearing evaluation within  the past year *Unable to fully assess hearing and vision during telephone visit type  Cognitive Function: 6CIT Screen 07/01/2019  What Year? 0 points  What month? 0 points  What time? 0 points  Count back from 20 0 points  Months in reverse 0 points  Repeat phrase 2 points  Total Score 2   (Normal:0-7, Significant for Dysfunction: >8)  Normal Cognitive Function Screening: Yes   Immunization & Health Maintenance Record Immunization History  Administered Date(s) Administered  . Fluad Quad(high Dose 65+) 06/15/2019  . Influenza, High Dose Seasonal PF 06/29/2016, 07/07/2017, 06/29/2018  . Pneumococcal Conjugate-13 10/09/2015  . Pneumococcal Polysaccharide-23 07/10/2012  . Tdap 06/29/2018    Health Maintenance  Topic Date Due  . FOOT EXAM  09/01/2018  . HEMOGLOBIN A1C  12/21/2019  . OPHTHALMOLOGY EXAM  03/22/2020  . MAMMOGRAM  03/28/2021  . TETANUS/TDAP  06/29/2028  . INFLUENZA VACCINE  Completed  . DEXA SCAN  Completed  . PNA vac Low Risk Adult  Completed       Assessment  This is a routine wellness examination for [name].  Health Maintenance: Due or Overdue Health Maintenance Due  Topic Date Due  . FOOT EXAM  09/01/2018    [name] does not need a referral for Community Assistance: Care Management:   no Social Work:    no Prescription Assistance:  no Nutrition/Diabetes Education:  no   Plan:  Personalized Goals Goals Addressed            This Visit's Progress   . DIET - INCREASE WATER INTAKE       Try to drink 6-8 glasses of water daily.      Personalized Health Maintenance & Screening Recommendations  Shingles vaccine-pt states she had the Shingles vaccine when she was with Particia Nearing at her last practice. Advised current recommendations of also getting the Shingrix series to protect from Shingles as this vaccine wasn't available when she would have gotten the vaccine at the other location.  Lung Cancer Screening Recommended: no  (Low Dose CT Chest recommended if Age 61-80 years, 30 pack-year currently smoking OR have quit w/in past 15 years) Hepatitis C Screening recommended: no HIV Screening recommended: no  Advanced Directives: Written information was not prepared per patient's request.  Referrals & Orders No orders of the defined types were placed in this encounter.   Follow-up Plan . Follow-up with Terald Sleeper, PA-C as planned . Consider Shingrix series at your next visit with your PCP   I have personally reviewed and noted the following in the patient's chart:   . Medical and social history . Use of alcohol, tobacco or illicit drugs  . Current medications and supplements . Functional ability and status . Nutritional status . Physical activity . Advanced directives . List of other physicians . Hospitalizations, surgeries, and ER visits in previous 12 months . Vitals . Screenings to include cognitive, depression, and falls . Referrals and appointments  In addition, I have reviewed and discussed with [name] certain preventive protocols, quality metrics, and best practice recommendations. A written personalized care plan for preventive services as well as general preventive health recommendations is available and can be mailed to the patient at her request.      [place], Donny Pique, LPN  07/01/2019      

## 2019-07-01 NOTE — Patient Instructions (Signed)
Preventive Care 1 Years and Older, Female Preventive care refers to lifestyle choices and visits with your health care provider that can promote health and wellness. This includes:  A yearly physical exam. This is also called an annual well check.  Regular dental and eye exams.  Immunizations.  Screening for certain conditions.  Healthy lifestyle choices, such as diet and exercise. What can I expect for my preventive care visit? Physical exam Your health care provider will check:  Height and weight. These may be used to calculate body mass index (BMI), which is a measurement that tells if you are at a healthy weight.  Heart rate and blood pressure.  Your skin for abnormal spots. Counseling Your health care provider may ask you questions about:  Alcohol, tobacco, and drug use.  Emotional well-being.  Home and relationship well-being.  Sexual activity.  Eating habits.  History of falls.  Memory and ability to understand (cognition).  Work and work Statistician.  Pregnancy and menstrual history. What immunizations do I need?  Influenza (flu) vaccine  This is recommended every year. Tetanus, diphtheria, and pertussis (Tdap) vaccine  You may need a Td booster every 10 years. Varicella (chickenpox) vaccine  You may need this vaccine if you have not already been vaccinated. Zoster (shingles) vaccine  You may need this after age 51. Pneumococcal conjugate (PCV13) vaccine  One dose is recommended after age 60. Pneumococcal polysaccharide (PPSV23) vaccine  One dose is recommended after age 72. Measles, mumps, and rubella (MMR) vaccine  You may need at least one dose of MMR if you were born in 1957 or later. You may also need a second dose. Meningococcal conjugate (MenACWY) vaccine  You may need this if you have certain conditions. Hepatitis A vaccine  You may need this if you have certain conditions or if you travel or work in places where you may be exposed  to hepatitis A. Hepatitis B vaccine  You may need this if you have certain conditions or if you travel or work in places where you may be exposed to hepatitis B. Haemophilus influenzae type b (Hib) vaccine  You may need this if you have certain conditions. You may receive vaccines as individual doses or as more than one vaccine together in one shot (combination vaccines). Talk with your health care provider about the risks and benefits of combination vaccines. What tests do I need? Blood tests  Lipid and cholesterol levels. These may be checked every 5 years, or more frequently depending on your overall health.  Hepatitis C test.  Hepatitis B test. Screening  Lung cancer screening. You may have this screening every year starting at age 67 if you have a 30-pack-year history of smoking and currently smoke or have quit within the past 15 years.  Colorectal cancer screening. All adults should have this screening starting at age 59 and continuing until age 73. Your health care provider may recommend screening at age 56 if you are at increased risk. You will have tests every 1-10 years, depending on your results and the type of screening test.  Diabetes screening. This is done by checking your blood sugar (glucose) after you have not eaten for a while (fasting). You may have this done every 1-3 years.  Mammogram. This may be done every 1-2 years. Talk with your health care provider about how often you should have regular mammograms.  BRCA-related cancer screening. This may be done if you have a family history of breast, ovarian, tubal, or peritoneal cancers.  Other tests  Sexually transmitted disease (STD) testing.  Bone density scan. This is done to screen for osteoporosis. You may have this done starting at age 40. Follow these instructions at home: Eating and drinking  Eat a diet that includes fresh fruits and vegetables, whole grains, lean protein, and low-fat dairy products. Limit  your intake of foods with high amounts of sugar, saturated fats, and salt.  Take vitamin and mineral supplements as recommended by your health care provider.  Do not drink alcohol if your health care provider tells you not to drink.  If you drink alcohol: ? Limit how much you have to 0-1 drink a day. ? Be aware of how much alcohol is in your drink. In the [place] one drink equals one 12 oz bottle of beer (355 mL), one 5 oz glass of wine (148 mL), or one 1 oz glass of hard liquor (44 mL). Lifestyle  Take daily care of your teeth and gums.  Stay active. Exercise for at least 30 minutes on 5 or more days each week.  Do not use any products that contain nicotine or tobacco, such as cigarettes, e-cigarettes, and chewing tobacco. If you need help quitting, ask your health care provider.  If you are sexually active, practice safe sex. Use a condom or other form of protection in order to prevent STIs (sexually transmitted infections).  Talk with your health care provider about taking a low-dose aspirin or statin. What's next?  Go to your health care provider once a year for a well check visit.  Ask your health care provider how often you should have your eyes and teeth checked.  Stay up to date on all vaccines. This information is not intended to replace advice given to you by your health care provider. Make sure you discuss any questions you have with your health care provider. Document Released: 09/29/2015 Document Revised: 08/27/2018 Document Reviewed: 08/27/2018 Elsevier Patient Education  2020 [employer].

## 2019-08-18 ENCOUNTER — Other Ambulatory Visit: Payer: Self-pay | Admitting: Physician Assistant

## 2019-08-18 DIAGNOSIS — E1065 Type 1 diabetes mellitus with hyperglycemia: Secondary | ICD-10-CM

## 2019-09-04 ENCOUNTER — Other Ambulatory Visit: Payer: Self-pay | Admitting: Physician Assistant

## 2019-09-04 DIAGNOSIS — J301 Allergic rhinitis due to pollen: Secondary | ICD-10-CM

## 2019-09-21 ENCOUNTER — Other Ambulatory Visit: Payer: Self-pay

## 2019-09-22 ENCOUNTER — Ambulatory Visit (INDEPENDENT_AMBULATORY_CARE_PROVIDER_SITE_OTHER): Payer: Medicare Other | Admitting: Physician Assistant

## 2019-09-22 ENCOUNTER — Encounter: Payer: Self-pay | Admitting: Physician Assistant

## 2019-09-22 VITALS — BP 130/63 | HR 75 | Temp 97.1°F | Ht 66.0 in | Wt 216.2 lb

## 2019-09-22 DIAGNOSIS — I10 Essential (primary) hypertension: Secondary | ICD-10-CM | POA: Diagnosis not present

## 2019-09-22 DIAGNOSIS — E1065 Type 1 diabetes mellitus with hyperglycemia: Secondary | ICD-10-CM | POA: Diagnosis not present

## 2019-09-22 DIAGNOSIS — E7800 Pure hypercholesterolemia, unspecified: Secondary | ICD-10-CM

## 2019-09-22 LAB — BAYER DCA HB A1C WAIVED: HB A1C (BAYER DCA - WAIVED): 6.9 % (ref <7.0)

## 2019-09-23 LAB — CMP14+EGFR
ALT: 14 IU/L (ref 0–32)
AST: 21 IU/L (ref 0–40)
Albumin/Globulin Ratio: 1.8 (ref 1.2–2.2)
Albumin: 4.4 g/dL (ref 3.7–4.7)
Alkaline Phosphatase: 73 IU/L (ref 39–117)
BUN/Creatinine Ratio: 17 (ref 12–28)
BUN: 21 mg/dL (ref 8–27)
Bilirubin Total: 0.3 mg/dL (ref 0.0–1.2)
CO2: 22 mmol/L (ref 20–29)
Calcium: 9.6 mg/dL (ref 8.7–10.3)
Chloride: 102 mmol/L (ref 96–106)
Creatinine, Ser: 1.21 mg/dL — ABNORMAL HIGH (ref 0.57–1.00)
GFR calc Af Amer: 50 mL/min/{1.73_m2} — ABNORMAL LOW (ref >59)
GFR calc non Af Amer: 44 mL/min/{1.73_m2} — ABNORMAL LOW (ref >59)
Globulin, Total: 2.5 g/dL (ref 1.5–4.5)
Glucose: 143 mg/dL — ABNORMAL HIGH (ref 65–99)
Potassium: 4.5 mmol/L (ref 3.5–5.2)
Sodium: 139 mmol/L (ref 134–144)
Total Protein: 6.9 g/dL (ref 6.0–8.5)

## 2019-09-23 LAB — CBC WITH DIFFERENTIAL/PLATELET
Basophils Absolute: 0.1 10*3/uL (ref 0.0–0.2)
Basos: 1 %
EOS (ABSOLUTE): 0.2 10*3/uL (ref 0.0–0.4)
Eos: 2 %
Hematocrit: 37.9 % (ref 34.0–46.6)
Hemoglobin: 12.4 g/dL (ref 11.1–15.9)
Immature Grans (Abs): 0 10*3/uL (ref 0.0–0.1)
Immature Granulocytes: 0 %
Lymphocytes Absolute: 2.9 10*3/uL (ref 0.7–3.1)
Lymphs: 26 %
MCH: 29.3 pg (ref 26.6–33.0)
MCHC: 32.7 g/dL (ref 31.5–35.7)
MCV: 90 fL (ref 79–97)
Monocytes Absolute: 0.9 10*3/uL (ref 0.1–0.9)
Monocytes: 8 %
Neutrophils Absolute: 6.8 10*3/uL (ref 1.4–7.0)
Neutrophils: 63 %
Platelets: 399 10*3/uL (ref 150–450)
RBC: 4.23 x10E6/uL (ref 3.77–5.28)
RDW: 13.4 % (ref 11.7–15.4)
WBC: 10.9 10*3/uL — ABNORMAL HIGH (ref 3.4–10.8)

## 2019-09-23 LAB — LIPID PANEL
Chol/HDL Ratio: 2.8 ratio (ref 0.0–4.4)
Cholesterol, Total: 130 mg/dL (ref 100–199)
HDL: 46 mg/dL (ref >39)
LDL Chol Calc (NIH): 55 mg/dL (ref 0–99)
Triglycerides: 175 mg/dL — ABNORMAL HIGH (ref 0–149)
VLDL Cholesterol Cal: 29 mg/dL (ref 5–40)

## 2019-09-23 LAB — TSH: TSH: 2.02 u[IU]/mL (ref 0.450–4.500)

## 2019-09-26 NOTE — Progress Notes (Signed)
Acute Office Visit  Subjective:    Patient ID: Adrienne Cook, female    DOB: 04-30-1941, 79 y.o.   MRN: [id number]  Chief Complaint  Patient presents with  . Diabetes  . Hypertension  . Medical Management of Chronic Issues    Diabetes She presents for her follow-up diabetic visit. She has type 1 diabetes mellitus. Her disease course has been improving. There are no hypoglycemic associated symptoms. There are no diabetic associated symptoms. Pertinent negatives for diabetes include no chest pain and no fatigue. Symptoms are stable. Risk factors for coronary artery disease include diabetes mellitus and dyslipidemia. She is compliant with treatment all of the time. Her weight is stable. She is following a diabetic diet. An ACE inhibitor/angiotensin II receptor blocker is being taken. She does not see a podiatrist.Eye exam is current.  Hypertension Pertinent negatives include no chest pain.     Past Medical History:  Diagnosis Date  . 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 Diabetes mellitus without complication ([place])   . Hypercholesterolemia   . Hypertension   . Irregular heartbeat    a. normal event monitor  . Obesity   . PONV (postoperative nausea and vomiting)   . Seasonal allergies   . Sleep apnea    does not tolerate CPAP    Past Surgical History:  Procedure Laterality Date  . APPENDECTOMY    . BREAST CYST EXCISION     benign  . CARPAL TUNNEL RELEASE    . CHOLECYSTECTOMY    . COLONOSCOPY  02/13/2012  . EXAMINATION UNDER ANESTHESIA     and sphincterotomy  . HAMMER TOE SURGERY     bilateral; 5th toes  . LEFT HEART CATH  02/28/2012   Procedure: LEFT HEART CATH;  Surgeon: Hillary Bow, MD;  Location: [facility] CATH LAB;  Service: Cardiovascular;;  . LEFT HEART CATHETERIZATION WITH CORONARY ANGIOGRAM N/A 02/28/2012   Procedure: LEFT HEART CATHETERIZATION WITH CORONARY ANGIOGRAM;  Surgeon: Hillary Bow, MD;  Location: [facility] CATH LAB;  Service: Cardiovascular;  Laterality: N/A;  .  VAGINAL HYSTERECTOMY  1978    Family History  Problem Relation Age of Onset  . Heart failure Mother        CHF   . COPD Father   . Cancer Brother        prostate  . Kidney Stones Brother   . Epilepsy Daughter   . Kidney Stones Daughter   . Colon cancer Neg Hx   . Stomach cancer Neg Hx     Social History   Socioeconomic History  . Marital status: Married    Spouse name: Not on file  . Number of children: 1  . Years of education: 79  . Highest education level: 12th grade  Occupational History  . Occupation: Retired    Comment: Passenger transport manager  Tobacco Use  . Smoking status: Never Smoker  . Smokeless tobacco: Never Used  . Tobacco comment: tobacco use - no  Substance and Sexual Activity  . Alcohol use: No  . Drug use: No  . Sexual activity: Not Currently    Birth control/protection: Surgical    Comment: hyst  Other Topics Concern  . Not on file  Social History Narrative  . Not on file   Social Determinants of Health   Financial Resource Strain:   . Difficulty of Paying Living Expenses: Not on file  Food Insecurity:   . Worried About Charity fundraiser in the Last Year: Not on file  .  Ran Out of Food in the Last Year: Not on file  Transportation Needs:   . Lack of Transportation (Medical): Not on file  . Lack of Transportation (Non-Medical): Not on file  Physical Activity:   . Days of Exercise per Week: Not on file  . Minutes of Exercise per Session: Not on file  Stress:   . Feeling of Stress : Not on file  Social Connections:   . Frequency of Communication with Friends and Family: Not on file  . Frequency of Social Gatherings with Friends and Family: Not on file  . Attends Religious Services: Not on file  . Active Member of Clubs or Organizations: Not on file  . Attends Archivist Meetings: Not on file  . Marital Status: Not on file  Intimate Partner Violence:   . Fear of Current or Ex-Partner: Not on file  . Emotionally Abused: Not on  file  . Physically Abused: Not on file  . Sexually Abused: Not on file    Outpatient Medications Prior to Visit  Medication Sig Dispense Refill  . aspirin 81 MG EC tablet Take 1 tablet (81 mg total) by mouth daily.    . calcium elemental as carbonate (BARIATRIC TUMS ULTRA) 400 MG chewable tablet Chew 1,000 mg by mouth 3 (three) times daily.    . Cholecalciferol (VITAMIN D3) 5000 UNITS TABS Take 5,000 Units by mouth daily.    . citalopram (CELEXA) 40 MG tablet Take 1 tablet (40 mg total) by mouth daily. 90 tablet 3  . diltiazem (CARDIZEM CD) 240 MG 24 hr capsule TAKE ONE CAPSULE BY MOUTH ON AN EMPTY STOMACH IN THE MORNING 90 capsule 3  . fluticasone (FLONASE) 50 MCG/ACT nasal spray SPRAY 2 SPRAYS INTO EACH NOSTRIL EVERY DAY 48 mL 1  . gabapentin (NEURONTIN) 400 MG capsule TAKE 1 TO 3 CAPSULES BY MOUTH AT BEDTIME 270 capsule 3  . insulin NPH Human (HUMULIN N) 100 UNIT/ML injection INJECT 0.4 MLS (40 UNITS TOTAL) INTO THE SKIN 2 (TWO) TIMES DAILY. 70 mL 5  . insulin regular (HUMULIN R) 100 units/mL injection INJECT 10 TO 20 UNITS SUBCUTANEOUSLY THREE TIMES DAILY BEFORE MEAL(S) 30 mL 0  . Insulin Syringe-Needle U-100 (INSULIN SYRINGE .5CC/30GX1/2") 30G X 1/2" 0.5 ML MISC INJECT 1 EACH INTO THE SKIN 5 (FIVE) TIMES DAILY. 300 each 10  . lisinopril-hydrochlorothiazide (ZESTORETIC) 20-25 MG tablet Take 1 tablet by mouth daily. 90 tablet 3  . loratadine (CLARITIN) 10 MG tablet Take 1 tablet (10 mg total) by mouth daily. 90 tablet 3  . metFORMIN (GLUCOPHAGE) 1000 MG tablet Take 1 tablet (1,000 mg total) by mouth 2 (two) times daily with a meal. 180 tablet 3  . ONETOUCH ULTRA test strip TEST TWICE DAILY E11.9 200 strip 3  . simvastatin (ZOCOR) 40 MG tablet Take 1 tablet (40 mg total) by mouth daily. 90 tablet 3  . oxybutynin (DITROPAN-XL) 10 MG 24 hr tablet Take 1 tablet (10 mg total) by mouth at bedtime. (Patient not taking: Reported on 09/22/2019) 30 tablet 5  . Continuous Blood Gluc Receiver (FREESTYLE  LIBRE 14 DAY READER) DEVI 1 each by Does not apply route continuous. (Patient not taking: Reported on 07/01/2019) 1 Units 2  . Continuous Blood Gluc Sensor (FREESTYLE LIBRE 14 DAY SENSOR) MISC 1 each by Does not apply route continuous. (Patient not taking: Reported on 07/01/2019) 2 each 11  . meclizine (ANTIVERT) 25 MG tablet Take 1 tablet (25 mg total) by mouth 3 (three) times daily as needed  for dizziness. (Patient not taking: Reported on 09/22/2019) 30 tablet 5   No facility-administered medications prior to visit.    No Known Allergies  Review of Systems  Constitutional: Negative.  Negative for activity change, fatigue and fever.  HENT: Negative.   Eyes: Negative.   Respiratory: Negative.  Negative for cough.   Cardiovascular: Negative.  Negative for chest pain.  Gastrointestinal: Negative.  Negative for abdominal pain.  Endocrine: Negative.   Genitourinary: Negative.  Negative for dysuria.  Musculoskeletal: Negative.   Skin: Negative.   Neurological: Negative.        Objective:    Physical Exam Constitutional:      General: She is not in acute distress.    Appearance: Normal appearance. She is well-developed.  HENT:     Head: Normocephalic and atraumatic.  Cardiovascular:     Rate and Rhythm: Normal rate.  Pulmonary:     Effort: Pulmonary effort is normal.  Skin:    General: Skin is warm and dry.     Findings: No rash.  Neurological:     Mental Status: She is alert and oriented to person, place, and time.     Deep Tendon Reflexes: Reflexes are normal and symmetric.     BP 130/63   Pulse 75   Temp (!) 97.1 F (36.2 C) (Temporal)   Ht '5\' 6"'$  (1.676 m)   Wt 216 lb 3.2 oz (98.1 kg)   BMI 34.90 kg/m  Wt Readings from Last 3 Encounters:  09/22/19 216 lb 3.2 oz (98.1 kg)  03/08/19 218 lb (98.9 kg)  12/03/18 214 lb (97.1 kg)    Health Maintenance Due  Topic Date Due  . FOOT EXAM  09/01/2018    There are no preventive care reminders to display for this  patient.   Lab Results  Component Value Date   TSH 2.020 09/22/2019   Lab Results  Component Value Date   WBC 10.9 (H) 09/22/2019   HGB 12.4 09/22/2019   HCT 37.9 09/22/2019   MCV 90 09/22/2019   PLT 399 09/22/2019   Lab Results  Component Value Date   NA 139 09/22/2019   K 4.5 09/22/2019   CO2 22 09/22/2019   GLUCOSE 143 (H) 09/22/2019   BUN 21 09/22/2019   CREATININE 1.21 (H) 09/22/2019   BILITOT 0.3 09/22/2019   ALKPHOS 73 09/22/2019   AST 21 09/22/2019   ALT 14 09/22/2019   PROT 6.9 09/22/2019   ALBUMIN 4.4 09/22/2019   CALCIUM 9.6 09/22/2019   GFR 58.35 (L) 03/16/2012   Lab Results  Component Value Date   CHOL 130 09/22/2019   Lab Results  Component Value Date   HDL 46 09/22/2019   Lab Results  Component Value Date   LDLCALC 55 09/22/2019   Lab Results  Component Value Date   TRIG 175 (H) 09/22/2019   Lab Results  Component Value Date   CHOLHDL 2.8 09/22/2019   Lab Results  Component Value Date   HGBA1C 6.9 09/22/2019       Assessment & Plan:   Problem List Items Addressed This Visit      Cardiovascular and Mediastinum   Essential hypertension   Relevant Orders   CBC with Differential/Platelet   Lipid Panel   TSH     Endocrine   Type 1 diabetes mellitus with hyperglycemia ([place]) - Primary   Relevant Orders   hgba1c (Completed)   CBC with Differential/Platelet   CMP14+EGFR   Lipid Panel     Other  HYPERCHOLESTEROLEMIA   Relevant Orders   CBC with Differential/Platelet   CMP14+EGFR   Lipid Panel   TSH       No orders of the defined types were placed in this encounter.    Terald Sleeper, PA-C

## 2019-09-28 ENCOUNTER — Other Ambulatory Visit: Payer: Self-pay | Admitting: Physician Assistant

## 2019-09-28 DIAGNOSIS — E1065 Type 1 diabetes mellitus with hyperglycemia: Secondary | ICD-10-CM

## 2019-10-11 ENCOUNTER — Other Ambulatory Visit: Payer: Self-pay | Admitting: Physician Assistant

## 2019-11-03 ENCOUNTER — Other Ambulatory Visit: Payer: Self-pay | Admitting: Physician Assistant

## 2019-11-03 DIAGNOSIS — E1065 Type 1 diabetes mellitus with hyperglycemia: Secondary | ICD-10-CM

## 2019-11-15 DIAGNOSIS — C44319 Basal cell carcinoma of skin of other parts of face: Secondary | ICD-10-CM | POA: Diagnosis not present

## 2019-11-15 DIAGNOSIS — L821 Other seborrheic keratosis: Secondary | ICD-10-CM | POA: Diagnosis not present

## 2019-11-29 ENCOUNTER — Other Ambulatory Visit: Payer: Self-pay | Admitting: Physician Assistant

## 2019-11-29 DIAGNOSIS — I10 Essential (primary) hypertension: Secondary | ICD-10-CM

## 2019-11-29 DIAGNOSIS — J301 Allergic rhinitis due to pollen: Secondary | ICD-10-CM

## 2019-12-21 ENCOUNTER — Encounter: Payer: Self-pay | Admitting: Nurse Practitioner

## 2019-12-21 ENCOUNTER — Other Ambulatory Visit: Payer: Self-pay

## 2019-12-21 ENCOUNTER — Ambulatory Visit: Payer: Medicare Other | Admitting: Physician Assistant

## 2019-12-21 ENCOUNTER — Ambulatory Visit (INDEPENDENT_AMBULATORY_CARE_PROVIDER_SITE_OTHER): Payer: Medicare Other | Admitting: Nurse Practitioner

## 2019-12-21 VITALS — BP 119/64 | HR 81 | Temp 98.0°F | Resp 20 | Ht 66.0 in | Wt 215.0 lb

## 2019-12-21 DIAGNOSIS — E781 Pure hyperglyceridemia: Secondary | ICD-10-CM

## 2019-12-21 DIAGNOSIS — F320 Major depressive disorder, single episode, mild: Secondary | ICD-10-CM

## 2019-12-21 DIAGNOSIS — E7800 Pure hypercholesterolemia, unspecified: Secondary | ICD-10-CM | POA: Diagnosis not present

## 2019-12-21 DIAGNOSIS — N289 Disorder of kidney and ureter, unspecified: Secondary | ICD-10-CM | POA: Diagnosis not present

## 2019-12-21 DIAGNOSIS — E1140 Type 2 diabetes mellitus with diabetic neuropathy, unspecified: Secondary | ICD-10-CM

## 2019-12-21 DIAGNOSIS — Z6841 Body Mass Index (BMI) 40.0 and over, adult: Secondary | ICD-10-CM

## 2019-12-21 DIAGNOSIS — I10 Essential (primary) hypertension: Secondary | ICD-10-CM | POA: Diagnosis not present

## 2019-12-21 DIAGNOSIS — E1065 Type 1 diabetes mellitus with hyperglycemia: Secondary | ICD-10-CM | POA: Diagnosis not present

## 2019-12-21 DIAGNOSIS — E1040 Type 1 diabetes mellitus with diabetic neuropathy, unspecified: Secondary | ICD-10-CM | POA: Diagnosis not present

## 2019-12-21 DIAGNOSIS — N3941 Urge incontinence: Secondary | ICD-10-CM

## 2019-12-21 LAB — BAYER DCA HB A1C WAIVED: HB A1C (BAYER DCA - WAIVED): 6.9 % (ref <7.0)

## 2019-12-21 MED ORDER — LISINOPRIL-HYDROCHLOROTHIAZIDE 20-25 MG PO TABS: 1.0000 | ORAL_TABLET | Freq: Every day | ORAL | 3 refills | Status: DC

## 2019-12-21 MED ORDER — CITALOPRAM HYDROBROMIDE 40 MG PO TABS: 40.0000 mg | ORAL_TABLET | Freq: Every day | ORAL | 1 refills | Status: DC

## 2019-12-21 MED ORDER — GABAPENTIN 400 MG PO CAPS: ORAL_CAPSULE | ORAL | 1 refills | Status: DC

## 2019-12-21 MED ORDER — METFORMIN HCL 1000 MG PO TABS: 1000.0000 mg | ORAL_TABLET | Freq: Two times a day (BID) | ORAL | 3 refills | Status: DC

## 2019-12-21 MED ORDER — INSULIN REGULAR HUMAN 100 UNIT/ML IJ SOLN: INTRAMUSCULAR | 0 refills | Status: DC

## 2019-12-21 MED ORDER — DILTIAZEM HCL ER COATED BEADS 240 MG PO CP24: 240.0000 mg | ORAL_CAPSULE | Freq: Every day | ORAL | 1 refills | Status: DC

## 2019-12-21 MED ORDER — SOLIFENACIN SUCCINATE 10 MG PO TABS: 10.0000 mg | ORAL_TABLET | Freq: Every day | ORAL | 2 refills | Status: DC

## 2019-12-21 MED ORDER — SIMVASTATIN 40 MG PO TABS: 40.0000 mg | ORAL_TABLET | Freq: Every day | ORAL | 3 refills | Status: DC

## 2019-12-21 MED ORDER — INSULIN NPH (HUMAN) (ISOPHANE) 100 UNIT/ML ~~LOC~~ SUSP: SUBCUTANEOUS | 5 refills | Status: DC

## 2019-12-21 NOTE — Progress Notes (Signed)
Subjective:    Patient ID: Adrienne Cook, female    DOB: May 21, 1941, 79 y.o.   MRN: [id number]    Chief Complaint: Medical Management of Chronic Issues    HPI:  1. Essential hypertension No c/o chest pain, sob or headache. Does not check blood pressure at home. 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 Has an occasional blood sugar over 200. Tries to watch diet. Has had no recent low blood sugars. She is currently on NPH and humulin , both BID. Would like to remain on current regimen, because she is dong so well. She has neuropathy in both feet and is on gabapentin '400mg'$  nightly, which helps. Lab Results  Component Value Date   HGBA1C 6.9 09/22/2019     3. Pure hypertriglyceridemia Does not watch diet and does very little exercise. Lab Results  Component Value Date   CHOL 130 09/22/2019   HDL 46 09/22/2019   LDLCALC 55 09/22/2019   TRIG 175 (H) 09/22/2019   CHOLHDL 2.8 09/22/2019     4. Renal insufficiency Denies any voiding problems Lab Results  Component Value Date   CREATININE 1.21 (H) 09/22/2019     5. Depression, major, single episode, mild ([place]) She is currently on celexa and is doing well. Depression screen [facility] 2/9 12/21/2019 09/22/2019 07/01/2019  Decreased Interest 0 0 0  Down, Depressed, Hopeless 0 0 0  PHQ - 2 Score 0 0 0  Altered sleeping - - -  Tired, decreased energy - - -  Change in appetite - - -  Feeling bad or failure about yourself  - - -  Trouble concentrating - - -  Moving slowly or fidgety/restless - - -  Suicidal thoughts - - -  PHQ-9 Score - - -  Difficult doing work/chores - - -     6. Class 3 severe obesity due to excess calories with serious comorbidity and body mass index (BMI) greater than or equal to 70 in adult [facility]) No recent weight changes Wt Readings from Last 3  Encounters:  12/21/19 215 lb (97.5 kg)  09/22/19 216 lb 3.2 oz (98.1 kg)  03/08/19 218 lb (98.9 kg)   BMI Readings from Last 3 Encounters:  12/21/19 34.70 kg/m  09/22/19 34.90 kg/m  03/08/19 35.19 kg/m       Outpatient Encounter Medications as of 12/21/2019  Medication Sig  . aspirin 81 MG EC tablet Take 1 tablet (81 mg total) by mouth daily.  . calcium elemental as carbonate (BARIATRIC TUMS ULTRA) 400 MG chewable tablet Chew 1,000 mg by mouth 3 (three) times daily.  . Cholecalciferol (VITAMIN D3) 5000 UNITS TABS Take 5,000 Units by mouth daily.  . citalopram (CELEXA) 40 MG tablet Take 1 tablet (40 mg total) by mouth daily.  [name] diltiazem (CARDIZEM CD) 240 MG 24 hr capsule TAKE ONE CAPSULE BY MOUTH ON AN EMPTY STOMACH IN THE MORNING  . fluticasone (FLONASE) 50 MCG/ACT nasal spray SPRAY 2 SPRAYS INTO EACH NOSTRIL EVERY DAY  . gabapentin (NEURONTIN) 400 MG capsule TAKE 1 TO 3 CAPSULES BY MOUTH AT BEDTIME  . insulin NPH Human (HUMULIN N) 100 UNIT/ML injection INJECT 0.4 MLS (40 UNITS TOTAL) INTO THE SKIN 2 (TWO) TIMES DAILY.  [name] insulin regular (HUMULIN R) 100 units/mL injection INJECT 10 TO 20 UNITS SUBCUTANEOUSLY THREE TIMES DAILY BEFORE MEAL(S)  .  Insulin Syringe-Needle U-100 (BD INSULIN SYRINGE U/F) 30G X 1/2" 0.5 ML MISC Inject into skin 5 times daily Dx E10.65  . lisinopril-hydrochlorothiazide (ZESTORETIC) 20-25 MG tablet Take 1 tablet by mouth daily.  [name] loratadine (CLARITIN) 10 MG tablet TAKE 1 TABLET BY MOUTH EVERY DAY  . metFORMIN (GLUCOPHAGE) 1000 MG tablet Take 1 tablet (1,000 mg total) by mouth 2 (two) times daily with a meal.  . ONETOUCH ULTRA test strip TEST TWICE DAILY E11.9  . oxybutynin (DITROPAN-XL) 10 MG 24 hr tablet Take 1 tablet (10 mg total) by mouth at bedtime. (Patient not taking: Reported on 09/22/2019)  . simvastatin (ZOCOR) 40 MG tablet Take 1 tablet (40 mg total) by mouth daily.   No facility-administered encounter medications on file as of 12/21/2019.    Past  Surgical History:  Procedure Laterality Date  . APPENDECTOMY    . BREAST CYST EXCISION     benign  . CARPAL TUNNEL RELEASE    . CHOLECYSTECTOMY    . COLONOSCOPY  02/13/2012  . EXAMINATION UNDER ANESTHESIA     and sphincterotomy  . HAMMER TOE SURGERY     bilateral; 5th toes  . LEFT HEART CATH  02/28/2012   Procedure: LEFT HEART CATH;  Surgeon: Hillary Bow, MD;  Location: [facility] CATH LAB;  Service: Cardiovascular;;  . LEFT HEART CATHETERIZATION WITH CORONARY ANGIOGRAM N/A 02/28/2012   Procedure: LEFT HEART CATHETERIZATION WITH CORONARY ANGIOGRAM;  Surgeon: Hillary Bow, MD;  Location: [facility] CATH LAB;  Service: Cardiovascular;  Laterality: N/A;  . VAGINAL HYSTERECTOMY  1978    Family History  Problem Relation Age of Onset  . Heart failure Mother        CHF   . COPD Father   . Cancer Brother        prostate  . Kidney Stones Brother   . Epilepsy Daughter   . Kidney Stones Daughter   . Colon cancer Neg Hx   . Stomach cancer Neg Hx     New complaints: Urinary incontinence has been occurring frequently. She has tried oxybutin and it helped but it dried her mouth out so bad.  Social history: Lives with her daughter.  Controlled substance contract: n/a     Review of Systems  Constitutional: Negative for diaphoresis.  Eyes: Negative for pain.  Respiratory: Negative for shortness of breath.   Cardiovascular: Negative for chest pain, palpitations and leg swelling.  Gastrointestinal: Negative for abdominal pain.  Endocrine: Negative for polydipsia.  Skin: Negative for rash.  Neurological: Negative for dizziness, weakness and headaches.  Hematological: Does not bruise/bleed easily.  All other systems reviewed and are negative.      Objective:   Physical Exam Vitals and nursing note reviewed.  Constitutional:      General: She is not in acute distress.    Appearance: Normal appearance. She is well-developed.  HENT:     Head: Normocephalic.     Nose: Nose normal.   Eyes:     Pupils: Pupils are equal, round, and reactive to light.  Neck:     Vascular: No carotid bruit or JVD.  Cardiovascular:     Rate and Rhythm: Normal rate and regular rhythm.     Heart sounds: Normal heart sounds.  Pulmonary:     Effort: Pulmonary effort is normal. No respiratory distress.     Breath sounds: Normal breath sounds. No wheezing or rales.  Chest:     Chest wall: No tenderness.  Abdominal:     General: Bowel  sounds are normal. There is no distension or abdominal bruit.     Palpations: Abdomen is soft. There is no hepatomegaly, splenomegaly, mass or pulsatile mass.     Tenderness: There is no abdominal tenderness.  Musculoskeletal:        General: Normal range of motion.     Cervical back: Normal range of motion and neck supple.  Lymphadenopathy:     Cervical: No cervical adenopathy.  Skin:    General: Skin is warm and dry.  Neurological:     Mental Status: She is alert and oriented to person, place, and time.     Deep Tendon Reflexes: Reflexes are normal and symmetric.  Psychiatric:        Behavior: Behavior normal.        Thought Content: Thought content normal.        Judgment: Judgment normal.    BP 119/64   Pulse 81   Temp 98 F (36.7 C) (Temporal)   Resp 20   Ht '5\' 6"'$  (1.676 m)   Wt 215 lb (97.5 kg)   SpO2 98%   BMI 34.70 kg/m   Hgba1c 6.9       Assessment & Plan:  HENLI HEY comes in today with chief complaint of Medical Management of Chronic Issues   Diagnosis and orders addressed:  1. Essential hypertension Low sodium diet - CMP14+EGFR - CBC with Differential/Platelet - diltiazem (CARDIZEM CD) 240 MG 24 hr capsule; Take 1 capsule (240 mg total) by mouth daily.  Dispense: 90 capsule; Refill: 1 - lisinopril-hydrochlorothiazide (ZESTORETIC) 20-25 MG tablet; Take 1 tablet by mouth daily.  Dispense: 90 tablet; Refill: 3 - solifenacin (VESICARE) 10 MG tablet; Take 1 tablet (10 mg total) by mouth daily.  Dispense: 30 tablet;  Refill: 2  2. Controlled type 2 diabetes with neuropathy ([place]) Discussed changing to lantus or trulicity-she will contact insurance and if she wants to change we will make her an appointment with the clinical pharmacist - [facility] DCA Hb A1c Waived - Microalbumin / creatinine urine ratio - metFORMIN (GLUCOPHAGE) 1000 MG tablet; Take 1 tablet (1,000 mg total) by mouth 2 (two) times daily with a meal.  Dispense: 180 tablet; Refill: 3 - insulin regular (HUMULIN R) 100 units/mL injection; INJECT 10 TO 20 UNITS SUBCUTANEOUSLY THREE TIMES DAILY BEFORE MEAL(S)  Dispense: 30 mL; Refill: 0 - insulin NPH Human (HUMULIN N) 100 UNIT/ML injection; INJECT 0.4 MLS (40 UNITS TOTAL) INTO THE SKIN 2 (TWO) TIMES DAILY.  Dispense: 70 mL; Refill: 5 - gabapentin (NEURONTIN) 400 MG capsule; TAKE 1 TO 3 CAPSULES BY MOUTH AT BEDTIME  Dispense: 270 capsule; Refill: 1  3. Pure hypertriglyceridemia Low fat diet - simvastatin (ZOCOR) 40 MG tablet; Take 1 tablet (40 mg total) by mouth daily.  Dispense: 90 tablet; Refill: 3  4. Renal insufficiency  5. Depression, major, single episode, mild ([facility]) Stress management - citalopram (CELEXA) 40 MG tablet; Take 1 tablet (40 mg total) by mouth daily.  Dispense: 90 tablet; Refill: 1  6. Class 3 severe obesity due to excess calories with serious comorbidity and body mass index (BMI) greater than or equal to 70 in adult [facility]) Discussed diet and exercise for person with BMI >25 Will recheck weight in 3-6 months  7. Urgency incontinence started vesicare - side effect discussed   Labs pending Health Maintenance reviewed Diet and exercise encouraged  Follow up plan: 3 months   Mary-Margaret Hassell Done, FNP

## 2019-12-21 NOTE — Patient Instructions (Signed)

## 2019-12-22 DIAGNOSIS — L980 Pyogenic granuloma: Secondary | ICD-10-CM | POA: Diagnosis not present

## 2019-12-22 DIAGNOSIS — Z85828 Personal history of other malignant neoplasm of skin: Secondary | ICD-10-CM | POA: Diagnosis not present

## 2019-12-22 DIAGNOSIS — Z08 Encounter for follow-up examination after completed treatment for malignant neoplasm: Secondary | ICD-10-CM | POA: Diagnosis not present

## 2019-12-22 LAB — CBC WITH DIFFERENTIAL/PLATELET
Basophils Absolute: 0.1 10*3/uL (ref 0.0–0.2)
Basos: 1 %
EOS (ABSOLUTE): 0.2 10*3/uL (ref 0.0–0.4)
Eos: 2 %
Hematocrit: 37.2 % (ref 34.0–46.6)
Hemoglobin: 12.5 g/dL (ref 11.1–15.9)
Immature Grans (Abs): 0 10*3/uL (ref 0.0–0.1)
Immature Granulocytes: 0 %
Lymphocytes Absolute: 2.5 10*3/uL (ref 0.7–3.1)
Lymphs: 26 %
MCH: 29.6 pg (ref 26.6–33.0)
MCHC: 33.6 g/dL (ref 31.5–35.7)
MCV: 88 fL (ref 79–97)
Monocytes Absolute: 0.7 10*3/uL (ref 0.1–0.9)
Monocytes: 7 %
Neutrophils Absolute: 6.2 10*3/uL (ref 1.4–7.0)
Neutrophils: 64 %
Platelets: 397 10*3/uL (ref 150–450)
RBC: 4.22 x10E6/uL (ref 3.77–5.28)
RDW: 13.8 % (ref 11.7–15.4)
WBC: 9.8 10*3/uL (ref 3.4–10.8)

## 2019-12-22 LAB — CMP14+EGFR
ALT: 14 IU/L (ref 0–32)
AST: 20 IU/L (ref 0–40)
Albumin/Globulin Ratio: 1.5 (ref 1.2–2.2)
Albumin: 4.3 g/dL (ref 3.7–4.7)
Alkaline Phosphatase: 73 IU/L (ref 39–117)
BUN/Creatinine Ratio: 17 (ref 12–28)
BUN: 18 mg/dL (ref 8–27)
Bilirubin Total: 0.3 mg/dL (ref 0.0–1.2)
CO2: 21 mmol/L (ref 20–29)
Calcium: 9.8 mg/dL (ref 8.7–10.3)
Chloride: 103 mmol/L (ref 96–106)
Creatinine, Ser: 1.05 mg/dL — ABNORMAL HIGH (ref 0.57–1.00)
GFR calc Af Amer: 59 mL/min/{1.73_m2} — ABNORMAL LOW (ref >59)
GFR calc non Af Amer: 51 mL/min/{1.73_m2} — ABNORMAL LOW (ref >59)
Globulin, Total: 2.8 g/dL (ref 1.5–4.5)
Glucose: 142 mg/dL — ABNORMAL HIGH (ref 65–99)
Potassium: 4.8 mmol/L (ref 3.5–5.2)
Sodium: 141 mmol/L (ref 134–144)
Total Protein: 7.1 g/dL (ref 6.0–8.5)

## 2019-12-22 LAB — MICROALBUMIN / CREATININE URINE RATIO
Creatinine, Urine: 395.6 mg/dL
Microalb/Creat Ratio: 14 mg/g creat (ref 0–29)
Microalbumin, Urine: 54.3 ug/mL

## 2019-12-22 LAB — LIPID PANEL
Chol/HDL Ratio: 2.8 ratio (ref 0.0–4.4)
Cholesterol, Total: 132 mg/dL (ref 100–199)
HDL: 47 mg/dL (ref >39)
LDL Chol Calc (NIH): 61 mg/dL (ref 0–99)
Triglycerides: 140 mg/dL (ref 0–149)
VLDL Cholesterol Cal: 24 mg/dL (ref 5–40)

## 2020-01-14 ENCOUNTER — Other Ambulatory Visit: Payer: Self-pay | Admitting: *Deleted

## 2020-01-14 DIAGNOSIS — E1140 Type 2 diabetes mellitus with diabetic neuropathy, unspecified: Secondary | ICD-10-CM

## 2020-01-18 ENCOUNTER — Telehealth: Payer: Self-pay | Admitting: Nurse Practitioner

## 2020-01-18 NOTE — Telephone Encounter (Signed)
No answer, no voicemail.

## 2020-01-19 DIAGNOSIS — Z08 Encounter for follow-up examination after completed treatment for malignant neoplasm: Secondary | ICD-10-CM | POA: Diagnosis not present

## 2020-01-19 DIAGNOSIS — Z85828 Personal history of other malignant neoplasm of skin: Secondary | ICD-10-CM | POA: Diagnosis not present

## 2020-01-19 DIAGNOSIS — L301 Dyshidrosis [pompholyx]: Secondary | ICD-10-CM | POA: Diagnosis not present

## 2020-01-20 NOTE — Telephone Encounter (Signed)
Unable to contact patient. No voice mail.  There are refills on humulin.

## 2020-01-20 NOTE — Telephone Encounter (Signed)
Humulin R was sent to [facility] not [facility], she will be getting it filled at [facility] tomorrow

## 2020-02-25 DIAGNOSIS — E119 Type 2 diabetes mellitus without complications: Secondary | ICD-10-CM | POA: Diagnosis not present

## 2020-02-25 DIAGNOSIS — H40033 Anatomical narrow angle, bilateral: Secondary | ICD-10-CM | POA: Diagnosis not present

## 2020-02-29 ENCOUNTER — Other Ambulatory Visit: Payer: Self-pay | Admitting: *Deleted

## 2020-02-29 DIAGNOSIS — J301 Allergic rhinitis due to pollen: Secondary | ICD-10-CM

## 2020-02-29 MED ORDER — LORATADINE 10 MG PO TABS: 10.0000 mg | ORAL_TABLET | Freq: Every day | ORAL | 1 refills | Status: DC

## 2020-03-18 ENCOUNTER — Other Ambulatory Visit: Payer: Self-pay | Admitting: Nurse Practitioner

## 2020-03-18 DIAGNOSIS — N3941 Urge incontinence: Secondary | ICD-10-CM

## 2020-03-21 ENCOUNTER — Ambulatory Visit (INDEPENDENT_AMBULATORY_CARE_PROVIDER_SITE_OTHER): Payer: Medicare Other | Admitting: Nurse Practitioner

## 2020-03-21 ENCOUNTER — Encounter: Payer: Self-pay | Admitting: Nurse Practitioner

## 2020-03-21 ENCOUNTER — Other Ambulatory Visit: Payer: Self-pay

## 2020-03-21 VITALS — BP 124/67 | HR 82 | Temp 97.5°F | Resp 20 | Ht 66.0 in | Wt 218.0 lb

## 2020-03-21 DIAGNOSIS — E782 Mixed hyperlipidemia: Secondary | ICD-10-CM

## 2020-03-21 DIAGNOSIS — I10 Essential (primary) hypertension: Secondary | ICD-10-CM | POA: Diagnosis not present

## 2020-03-21 DIAGNOSIS — Z6841 Body Mass Index (BMI) 40.0 and over, adult: Secondary | ICD-10-CM

## 2020-03-21 DIAGNOSIS — N289 Disorder of kidney and ureter, unspecified: Secondary | ICD-10-CM

## 2020-03-21 DIAGNOSIS — K5790 Diverticulosis of intestine, part unspecified, without perforation or abscess without bleeding: Secondary | ICD-10-CM | POA: Diagnosis not present

## 2020-03-21 DIAGNOSIS — E1142 Type 2 diabetes mellitus with diabetic polyneuropathy: Secondary | ICD-10-CM | POA: Insufficient documentation

## 2020-03-21 DIAGNOSIS — E119 Type 2 diabetes mellitus without complications: Secondary | ICD-10-CM | POA: Insufficient documentation

## 2020-03-21 DIAGNOSIS — F320 Major depressive disorder, single episode, mild: Secondary | ICD-10-CM

## 2020-03-21 DIAGNOSIS — E781 Pure hyperglyceridemia: Secondary | ICD-10-CM

## 2020-03-21 DIAGNOSIS — E1140 Type 2 diabetes mellitus with diabetic neuropathy, unspecified: Secondary | ICD-10-CM | POA: Diagnosis not present

## 2020-03-21 DIAGNOSIS — N3281 Overactive bladder: Secondary | ICD-10-CM

## 2020-03-21 LAB — BAYER DCA HB A1C WAIVED: HB A1C (BAYER DCA - WAIVED): 7.2 % — ABNORMAL HIGH (ref <7.0)

## 2020-03-21 MED ORDER — DILTIAZEM HCL ER COATED BEADS 240 MG PO CP24: 240.0000 mg | ORAL_CAPSULE | Freq: Every day | ORAL | 1 refills | Status: DC

## 2020-03-21 MED ORDER — SIMVASTATIN 40 MG PO TABS: 40.0000 mg | ORAL_TABLET | Freq: Every day | ORAL | 1 refills | Status: DC

## 2020-03-21 MED ORDER — METFORMIN HCL 1000 MG PO TABS: 1000.0000 mg | ORAL_TABLET | Freq: Two times a day (BID) | ORAL | 1 refills | Status: DC

## 2020-03-21 MED ORDER — LISINOPRIL-HYDROCHLOROTHIAZIDE 20-25 MG PO TABS: 1.0000 | ORAL_TABLET | Freq: Every day | ORAL | 1 refills | Status: DC

## 2020-03-21 MED ORDER — NYSTATIN 100000 UNIT/GM EX CREA
1.0000 | TOPICAL_CREAM | Freq: Two times a day (BID) | CUTANEOUS | 1 refills | Status: DC
Start: 2020-03-21 — End: 2020-09-21

## 2020-03-21 MED ORDER — CITALOPRAM HYDROBROMIDE 40 MG PO TABS: 40.0000 mg | ORAL_TABLET | Freq: Every day | ORAL | 1 refills | Status: DC

## 2020-03-21 MED ORDER — GABAPENTIN 400 MG PO CAPS: ORAL_CAPSULE | ORAL | 1 refills | Status: DC

## 2020-03-21 NOTE — Progress Notes (Signed)
Subjective:    Patient ID: Adrienne Cook, female    DOB: [birth year], 79 y.o.   MRN: [id number]   Chief Complaint: Medical Management of Chronic Issues    HPI:  1. Essential hypertension No c/o chest pain, sob or headaches. She does not check her blood pressure at home. BP Readings from Last 3 Encounters:  03/21/20 124/67  12/21/19 119/64  09/22/19 130/63     2. Controlled type 2 diabetes with neuropathy (HCC) Fasting blood sugars are running around 120-160. She is on humulin N 2x a day and humulin r 3x a day. Lab Results  Component Value Date   HGBA1C 6.9 12/21/2019     3. Renal insufficiency No problems voiding Lab Results  Component Value Date   CREATININE 1.05 (H) 12/21/2019   BUN 18 12/21/2019   NA 141 12/21/2019   K 4.8 12/21/2019   CL 103 12/21/2019   CO2 21 12/21/2019     4. Mixed hyperlipidemia Does try to watch diet . She currently does no dedicated exercise. Lab Results  Component Value Date   CHOL 132 12/21/2019   HDL 47 12/21/2019   LDLCALC 61 12/21/2019   TRIG 140 12/21/2019   CHOLHDL 2.8 12/21/2019     5. Diverticulosis No recent flare ups- controls with avoid foods with seeds  6. Depression, major, single episode, mild ([place]) She is currently on celexa and is doing well. Depression screen [facility] 2/9 03/21/2020 12/21/2019 09/22/2019  Decreased Interest 0 0 0  Down, Depressed, Hopeless 0 0 0  PHQ - 2 Score 0 0 0  Altered sleeping - - -  Tired, decreased energy - - -  Change in appetite - - -  Feeling bad or failure about yourself  - - -  Trouble concentrating - - -  Moving slowly or fidgety/restless - - -  Suicidal thoughts - - -  PHQ-9 Score - - -  Difficult doing work/chores - - -     7. OAB (overactive bladder) She was on vesicare. She stopped taking because eit really did not seem to help. She does not want to try anything else at this time.  8. Class 3 severe obesity due to excess calories with serious comorbidity and body mass  index (BMI) greater than or equal to 70 in adult (HCC) Weight is up 3 lbs since last visit.  Wt Readings from Last 3 Encounters:  03/21/20 218 lb (98.9 kg)  12/21/19 215 lb (97.5 kg)  09/22/19 216 lb 3.2 oz (98.1 kg)   BMI Readings from Last 3 Encounters:  03/21/20 35.19 kg/m  12/21/19 34.70 kg/m  09/22/19 34.90 kg/m       Outpatient Encounter Medications as of 03/21/2020  Medication Sig  . aspirin 81 MG EC tablet Take 1 tablet (81 mg total) by mouth daily.  . calcium elemental as carbonate (BARIATRIC TUMS ULTRA) 400 MG chewable tablet Chew 1,000 mg by mouth 3 (three) times daily.  . Cholecalciferol (VITAMIN D3) 5000 UNITS TABS Take 5,000 Units by mouth daily.  . citalopram (CELEXA) 40 MG tablet Take 1 tablet (40 mg total) by mouth daily.  [name] diltiazem (CARDIZEM CD) 240 MG 24 hr capsule Take 1 capsule (240 mg total) by mouth daily.  . fluticasone (FLONASE) 50 MCG/ACT nasal spray SPRAY 2 SPRAYS INTO EACH NOSTRIL EVERY DAY  . gabapentin (NEURONTIN) 400 MG capsule TAKE 1 TO 3 CAPSULES BY MOUTH AT BEDTIME  . insulin NPH Human (HUMULIN N) 100 UNIT/ML injection INJECT 0.4 MLS (  40 UNITS TOTAL) INTO THE SKIN 2 (TWO) TIMES DAILY.  [name] insulin regular (HUMULIN R) 100 units/mL injection INJECT 10 TO 20 UNITS SUBCUTANEOUSLY THREE TIMES DAILY BEFORE MEAL(S)  . Insulin Syringe-Needle U-100 (BD INSULIN SYRINGE U/F) 30G X 1/2" 0.5 ML MISC Inject into skin 5 times daily Dx E10.65  . lisinopril-hydrochlorothiazide (ZESTORETIC) 20-25 MG tablet Take 1 tablet by mouth daily.  [name] loratadine (CLARITIN) 10 MG tablet Take 1 tablet (10 mg total) by mouth daily.  . metFORMIN (GLUCOPHAGE) 1000 MG tablet Take 1 tablet (1,000 mg total) by mouth 2 (two) times daily with a meal.  . ONETOUCH ULTRA test strip TEST TWICE DAILY E11.9  . simvastatin (ZOCOR) 40 MG tablet Take 1 tablet (40 mg total) by mouth daily.   Past Surgical History:  Procedure Laterality Date  . APPENDECTOMY    . BREAST CYST EXCISION     benign    . CARPAL TUNNEL RELEASE    . CHOLECYSTECTOMY    . COLONOSCOPY  02/13/2012  . EXAMINATION UNDER ANESTHESIA     and sphincterotomy  . HAMMER TOE SURGERY     bilateral; 5th toes  . LEFT HEART CATH  02/28/2012   Procedure: LEFT HEART CATH;  Surgeon: Hillary Bow, MD;  Location: [facility] CATH LAB;  Service: Cardiovascular;;  . LEFT HEART CATHETERIZATION WITH CORONARY ANGIOGRAM N/A 02/28/2012   Procedure: LEFT HEART CATHETERIZATION WITH CORONARY ANGIOGRAM;  Surgeon: Hillary Bow, MD;  Location: [facility] CATH LAB;  Service: Cardiovascular;  Laterality: N/A;  . VAGINAL HYSTERECTOMY  1978    Family History  Problem Relation Age of Onset  . Heart failure Mother        CHF   . COPD Father   . Cancer Brother        prostate  . Kidney Stones Brother   . Epilepsy Daughter   . Kidney Stones Daughter   . Colon cancer Neg Hx   . Stomach cancer Neg Hx     New complaints: Rash under bil breasts  Social history: Lives with her daughter  Controlled substance contract: n/a    Review of Systems  Constitutional: Negative for diaphoresis.  Eyes: Negative for pain.  Respiratory: Negative for shortness of breath.   Cardiovascular: Negative for chest pain, palpitations and leg swelling.  Gastrointestinal: Negative for abdominal pain.  Endocrine: Negative for polydipsia.  Skin: Negative for rash (under bil breast).  Neurological: Negative for dizziness, weakness and headaches.  Hematological: Does not bruise/bleed easily.  All other systems reviewed and are negative.      Objective:   Physical Exam Vitals and nursing note reviewed.  Constitutional:      General: She is not in acute distress.    Appearance: Normal appearance. She is well-developed.  HENT:     Head: Normocephalic.     Nose: Nose normal.  Eyes:     Pupils: Pupils are equal, round, and reactive to light.  Neck:     Vascular: No carotid bruit or JVD.  Cardiovascular:     Rate and Rhythm: Normal rate and regular rhythm.      Heart sounds: Normal heart sounds.  Pulmonary:     Effort: Pulmonary effort is normal. No respiratory distress.     Breath sounds: Normal breath sounds. No wheezing or rales.  Chest:     Chest wall: No tenderness.     Breasts:        Right: Normal.        Left: Normal.  Comments: Erythematous moist rash  bil mammory fold Abdominal:     General: Bowel sounds are normal. There is no distension or abdominal bruit.     Palpations: Abdomen is soft. There is no hepatomegaly, splenomegaly, mass or pulsatile mass.     Tenderness: There is no abdominal tenderness.  Musculoskeletal:        General: Normal range of motion.     Cervical back: Normal range of motion and neck supple.  Lymphadenopathy:     Cervical: No cervical adenopathy.  Skin:    General: Skin is warm and dry.  Neurological:     Mental Status: She is alert and oriented to person, place, and time.     Deep Tendon Reflexes: Reflexes are normal and symmetric.  Psychiatric:        Behavior: Behavior normal.        Thought Content: Thought content normal.        Judgment: Judgment normal.    BP 124/67   Pulse 82   Temp (!) 97.5 F (36.4 C) (Temporal)   Resp 20   Ht '5\' 6"'$  (1.676 m)   Wt 218 lb (98.9 kg)   SpO2 96%   BMI 35.19 kg/m   HGBA1c 7.2%      Assessment & Plan:  ASA FATH comes in today with chief complaint of Medical Management of Chronic Issues   Diagnosis and orders addressed:  1. Essential hypertension Low sodium diet - CBC with Differential/Platelet - CMP14+EGFR - diltiazem (CARDIZEM CD) 240 MG 24 hr capsule; Take 1 capsule (240 mg total) by mouth daily.  Dispense: 90 capsule; Refill: 1 - lisinopril-hydrochlorothiazide (ZESTORETIC) 20-25 MG tablet; Take 1 tablet by mouth daily.  Dispense: 90 tablet; Refill: 1  2. Controlled type 2 diabetes with neuropathy (HCC) Continue to wtach carbs in diet - Bayer DCA Hb A1c Waived - metFORMIN (GLUCOPHAGE) 1000 MG tablet; Take 1 tablet (1,000 mg  total) by mouth 2 (two) times daily with a meal.  Dispense: 180 tablet; Refill: 1 - gabapentin (NEURONTIN) 400 MG capsule; TAKE 1 TO 3 CAPSULES BY MOUTH AT BEDTIME  Dispense: 270 capsule; Refill: 1  3. Renal insufficiency labspending  4. Mixed hyperlipidemia Low fat diet encouraged - Lipid panel - simvastatin (ZOCOR) 40 MG tablet; Take 1 tablet (40 mg total) by mouth daily.  Dispense: 90 tablet; Refill: 1  5. Diverticulosis Continue to watch foods with small seeds  6. Depression, major, single episode, mild (HCC) Stress management - citalopram (CELEXA) 40 MG tablet; Take 1 tablet (40 mg total) by mouth daily.  Dispense: 90 tablet; Refill: 1  7. OAB (overactive bladder) Refuses to take any new meds  8. Class 3 severe obesity due to excess calories with serious comorbidity and body mass index (BMI) greater than or equal to 70 in adult [facility]) Discussed diet and exercise for person with BMI >25 Will recheck weight in 3-6 months     Labs pending Health Maintenance reviewed Diet and exercise encouraged  Follow up plan: 6 months   Mary-Margaret Hassell Done, FNP

## 2020-03-21 NOTE — Patient Instructions (Signed)
DASH Eating Plan DASH stands for "Dietary Approaches to Stop Hypertension." The DASH eating plan is a healthy eating plan that has been shown to reduce high blood pressure (hypertension). It may also reduce your risk for type 2 diabetes, heart disease, and stroke. The DASH eating plan may also help with weight loss. What are tips for following this plan?  General guidelines  Avoid eating more than 2,300 mg (milligrams) of salt (sodium) a day. If you have hypertension, you may need to reduce your sodium intake to 1,500 mg a day.  Limit alcohol intake to no more than 1 drink a day for nonpregnant women and 2 drinks a day for men. One drink equals 12 oz of beer, 5 oz of wine, or 1 oz of hard liquor.  Work with your health care provider to maintain a healthy body weight or to lose weight. Ask what an ideal weight is for you.  Get at least 30 minutes of exercise that causes your heart to beat faster (aerobic exercise) most days of the week. Activities may include walking, swimming, or biking.  Work with your health care provider or diet and nutrition specialist (dietitian) to adjust your eating plan to your individual calorie needs. Reading food labels   Check food labels for the amount of sodium per serving. Choose foods with less than 5 percent of the Daily Value of sodium. Generally, foods with less than 300 mg of sodium per serving fit into this eating plan.  To find whole grains, look for the word "whole" as the first word in the ingredient list. Shopping  Buy products labeled as "low-sodium" or "no salt added."  Buy fresh foods. Avoid canned foods and premade or frozen meals. Cooking  Avoid adding salt when cooking. Use salt-free seasonings or herbs instead of table salt or sea salt. Check with your health care provider or pharmacist before using salt substitutes.  Do not fry foods. Cook foods using healthy methods such as baking, boiling, grilling, and broiling instead.  Cook with  heart-healthy oils, such as olive, canola, soybean, or sunflower oil. Meal planning  Eat a balanced diet that includes: ? 5 or more servings of fruits and vegetables each day. At each meal, try to fill half of your plate with fruits and vegetables. ? Up to 6-8 servings of whole grains each day. ? Less than 6 oz of lean meat, poultry, or fish each day. A 3-oz serving of meat is about the same size as a deck of cards. One egg equals 1 oz. ? 2 servings of low-fat dairy each day. ? A serving of nuts, seeds, or beans 5 times each week. ? Heart-healthy fats. Healthy fats called Omega-3 fatty acids are found in foods such as flaxseeds and coldwater fish, like sardines, salmon, and mackerel.  Limit how much you eat of the following: ? Canned or prepackaged foods. ? Food that is high in trans fat, such as fried foods. ? Food that is high in saturated fat, such as fatty meat. ? Sweets, desserts, sugary drinks, and other foods with added sugar. ? Full-fat dairy products.  Do not salt foods before eating.  Try to eat at least 2 vegetarian meals each week.  Eat more home-cooked food and less restaurant, buffet, and fast food.  When eating at a restaurant, ask that your food be prepared with less salt or no salt, if possible. What foods are recommended? The items listed may not be a complete list. Talk with your dietitian about   what dietary choices are best for you. Grains Whole-grain or whole-wheat bread. Whole-grain or whole-wheat pasta. Brown rice. Oatmeal. Quinoa. Bulgur. Whole-grain and low-sodium cereals. Pita bread. Low-fat, low-sodium crackers. Whole-wheat flour tortillas. Vegetables Fresh or frozen vegetables (raw, steamed, roasted, or grilled). Low-sodium or reduced-sodium tomato and vegetable juice. Low-sodium or reduced-sodium tomato sauce and tomato paste. Low-sodium or reduced-sodium canned vegetables. Fruits All fresh, dried, or frozen fruit. Canned fruit in natural juice (without  added sugar). Meat and other protein foods Skinless chicken or turkey. Ground chicken or turkey. Pork with fat trimmed off. Fish and seafood. Egg whites. Dried beans, peas, or lentils. Unsalted nuts, nut butters, and seeds. Unsalted canned beans. Lean cuts of beef with fat trimmed off. Low-sodium, lean deli meat. Dairy Low-fat (1%) or fat-free (skim) milk. Fat-free, low-fat, or reduced-fat cheeses. Nonfat, low-sodium ricotta or cottage cheese. Low-fat or nonfat yogurt. Low-fat, low-sodium cheese. Fats and oils Soft margarine without trans fats. Vegetable oil. Low-fat, reduced-fat, or light mayonnaise and salad dressings (reduced-sodium). Canola, safflower, olive, soybean, and sunflower oils. Avocado. Seasoning and other foods Herbs. Spices. Seasoning mixes without salt. Unsalted popcorn and pretzels. Fat-free sweets. What foods are not recommended? The items listed may not be a complete list. Talk with your dietitian about what dietary choices are best for you. Grains Baked goods made with fat, such as croissants, muffins, or some breads. Dry pasta or rice meal packs. Vegetables Creamed or fried vegetables. Vegetables in a cheese sauce. Regular canned vegetables (not low-sodium or reduced-sodium). Regular canned tomato sauce and paste (not low-sodium or reduced-sodium). Regular tomato and vegetable juice (not low-sodium or reduced-sodium). Pickles. Olives. Fruits Canned fruit in a light or heavy syrup. Fried fruit. Fruit in cream or butter sauce. Meat and other protein foods Fatty cuts of meat. Ribs. Fried meat. Bacon. Sausage. Bologna and other processed lunch meats. Salami. Fatback. Hotdogs. Bratwurst. Salted nuts and seeds. Canned beans with added salt. Canned or smoked fish. Whole eggs or egg yolks. Chicken or turkey with skin. Dairy Whole or 2% milk, cream, and half-and-half. Whole or full-fat cream cheese. Whole-fat or sweetened yogurt. Full-fat cheese. Nondairy creamers. Whipped toppings.  Processed cheese and cheese spreads. Fats and oils Butter. Stick margarine. Lard. Shortening. Ghee. Bacon fat. Tropical oils, such as coconut, palm kernel, or palm oil. Seasoning and other foods Salted popcorn and pretzels. Onion salt, garlic salt, seasoned salt, table salt, and sea salt. Worcestershire sauce. Tartar sauce. Barbecue sauce. Teriyaki sauce. Soy sauce, including reduced-sodium. Steak sauce. Canned and packaged gravies. Fish sauce. Oyster sauce. Cocktail sauce. Horseradish that you find on the shelf. Ketchup. Mustard. Meat flavorings and tenderizers. Bouillon cubes. Hot sauce and Tabasco sauce. Premade or packaged marinades. Premade or packaged taco seasonings. Relishes. Regular salad dressings. Where to find more information:  National Heart, Lung, and Blood Institute: www.nhlbi.nih.gov  American Heart Association: www.heart.org Summary  The DASH eating plan is a healthy eating plan that has been shown to reduce high blood pressure (hypertension). It may also reduce your risk for type 2 diabetes, heart disease, and stroke.  With the DASH eating plan, you should limit salt (sodium) intake to 2,300 mg a day. If you have hypertension, you may need to reduce your sodium intake to 1,500 mg a day.  When on the DASH eating plan, aim to eat more fresh fruits and vegetables, whole grains, lean proteins, low-fat dairy, and heart-healthy fats.  Work with your health care provider or diet and nutrition specialist (dietitian) to adjust your eating plan to your   individual calorie needs. This information is not intended to replace advice given to you by your health care provider. Make sure you discuss any questions you have with your health care provider. Document Revised: 08/15/2017 Document Reviewed: 08/26/2016 Elsevier Patient Education  2020 [employer]  

## 2020-03-22 LAB — LIPID PANEL
Chol/HDL Ratio: 3.1 ratio (ref 0.0–4.4)
Cholesterol, Total: 147 mg/dL (ref 100–199)
HDL: 48 mg/dL (ref >39)
LDL Chol Calc (NIH): 73 mg/dL (ref 0–99)
Triglycerides: 148 mg/dL (ref 0–149)
VLDL Cholesterol Cal: 26 mg/dL (ref 5–40)

## 2020-03-22 LAB — CBC WITH DIFFERENTIAL/PLATELET
Basophils Absolute: 0.1 10*3/uL (ref 0.0–0.2)
Basos: 1 %
EOS (ABSOLUTE): 0.2 10*3/uL (ref 0.0–0.4)
Eos: 2 %
Hematocrit: 39.4 % (ref 34.0–46.6)
Hemoglobin: 12.5 g/dL (ref 11.1–15.9)
Immature Grans (Abs): 0 10*3/uL (ref 0.0–0.1)
Immature Granulocytes: 0 %
Lymphocytes Absolute: 2.5 10*3/uL (ref 0.7–3.1)
Lymphs: 26 %
MCH: 28.7 pg (ref 26.6–33.0)
MCHC: 31.7 g/dL (ref 31.5–35.7)
MCV: 91 fL (ref 79–97)
Monocytes Absolute: 0.6 10*3/uL (ref 0.1–0.9)
Monocytes: 6 %
Neutrophils Absolute: 6.5 10*3/uL (ref 1.4–7.0)
Neutrophils: 65 %
Platelets: 405 10*3/uL (ref 150–450)
RBC: 4.35 x10E6/uL (ref 3.77–5.28)
RDW: 13.7 % (ref 11.7–15.4)
WBC: 9.9 10*3/uL (ref 3.4–10.8)

## 2020-03-22 LAB — CMP14+EGFR
ALT: 12 IU/L (ref 0–32)
AST: 14 IU/L (ref 0–40)
Albumin/Globulin Ratio: 1.6 (ref 1.2–2.2)
Albumin: 4.1 g/dL (ref 3.7–4.7)
Alkaline Phosphatase: 75 IU/L (ref 48–121)
BUN/Creatinine Ratio: 20 (ref 12–28)
BUN: 24 mg/dL (ref 8–27)
Bilirubin Total: <0.2 mg/dL (ref 0.0–1.2)
CO2: 20 mmol/L (ref 20–29)
Calcium: 9.4 mg/dL (ref 8.7–10.3)
Chloride: 102 mmol/L (ref 96–106)
Creatinine, Ser: 1.2 mg/dL — ABNORMAL HIGH (ref 0.57–1.00)
GFR calc Af Amer: 50 mL/min/{1.73_m2} — ABNORMAL LOW (ref >59)
GFR calc non Af Amer: 44 mL/min/{1.73_m2} — ABNORMAL LOW (ref >59)
Globulin, Total: 2.6 g/dL (ref 1.5–4.5)
Glucose: 188 mg/dL — ABNORMAL HIGH (ref 65–99)
Potassium: 4.9 mmol/L (ref 3.5–5.2)
Sodium: 139 mmol/L (ref 134–144)
Total Protein: 6.7 g/dL (ref 6.0–8.5)

## 2020-03-28 DIAGNOSIS — Z1231 Encounter for screening mammogram for malignant neoplasm of breast: Secondary | ICD-10-CM | POA: Diagnosis not present

## 2020-04-03 ENCOUNTER — Other Ambulatory Visit: Payer: Self-pay | Admitting: Nurse Practitioner

## 2020-04-03 DIAGNOSIS — E1140 Type 2 diabetes mellitus with diabetic neuropathy, unspecified: Secondary | ICD-10-CM

## 2020-04-09 DIAGNOSIS — R300 Dysuria: Secondary | ICD-10-CM | POA: Diagnosis not present

## 2020-04-09 DIAGNOSIS — N3001 Acute cystitis with hematuria: Secondary | ICD-10-CM | POA: Diagnosis not present

## 2020-04-09 DIAGNOSIS — R8290 Unspecified abnormal findings in urine: Secondary | ICD-10-CM | POA: Diagnosis not present

## 2020-05-08 ENCOUNTER — Other Ambulatory Visit: Payer: Self-pay

## 2020-05-08 MED ORDER — GLUCOSE BLOOD VI STRP: ORAL_STRIP | 12 refills | Status: DC

## 2020-05-21 ENCOUNTER — Other Ambulatory Visit: Payer: Self-pay | Admitting: Nurse Practitioner

## 2020-05-21 DIAGNOSIS — E1140 Type 2 diabetes mellitus with diabetic neuropathy, unspecified: Secondary | ICD-10-CM

## 2020-07-29 ENCOUNTER — Other Ambulatory Visit: Payer: Self-pay | Admitting: Nurse Practitioner

## 2020-07-29 DIAGNOSIS — E1140 Type 2 diabetes mellitus with diabetic neuropathy, unspecified: Secondary | ICD-10-CM

## 2020-08-24 ENCOUNTER — Other Ambulatory Visit: Payer: Self-pay | Admitting: Nurse Practitioner

## 2020-08-24 DIAGNOSIS — J301 Allergic rhinitis due to pollen: Secondary | ICD-10-CM

## 2020-08-29 ENCOUNTER — Other Ambulatory Visit: Payer: Self-pay | Admitting: Nurse Practitioner

## 2020-08-29 DIAGNOSIS — J301 Allergic rhinitis due to pollen: Secondary | ICD-10-CM

## 2020-09-21 ENCOUNTER — Ambulatory Visit (INDEPENDENT_AMBULATORY_CARE_PROVIDER_SITE_OTHER): Payer: Medicare Other | Admitting: Nurse Practitioner

## 2020-09-21 ENCOUNTER — Encounter: Payer: Self-pay | Admitting: Nurse Practitioner

## 2020-09-21 ENCOUNTER — Other Ambulatory Visit: Payer: Self-pay

## 2020-09-21 VITALS — BP 132/66 | HR 80 | Temp 97.8°F | Resp 20 | Ht 66.0 in | Wt 218.0 lb

## 2020-09-21 DIAGNOSIS — G4733 Obstructive sleep apnea (adult) (pediatric): Secondary | ICD-10-CM | POA: Diagnosis not present

## 2020-09-21 DIAGNOSIS — K5790 Diverticulosis of intestine, part unspecified, without perforation or abscess without bleeding: Secondary | ICD-10-CM | POA: Diagnosis not present

## 2020-09-21 DIAGNOSIS — E1140 Type 2 diabetes mellitus with diabetic neuropathy, unspecified: Secondary | ICD-10-CM | POA: Diagnosis not present

## 2020-09-21 DIAGNOSIS — I10 Essential (primary) hypertension: Secondary | ICD-10-CM

## 2020-09-21 DIAGNOSIS — E782 Mixed hyperlipidemia: Secondary | ICD-10-CM

## 2020-09-21 DIAGNOSIS — Z6841 Body Mass Index (BMI) 40.0 and over, adult: Secondary | ICD-10-CM

## 2020-09-21 DIAGNOSIS — N289 Disorder of kidney and ureter, unspecified: Secondary | ICD-10-CM | POA: Diagnosis not present

## 2020-09-21 DIAGNOSIS — F320 Major depressive disorder, single episode, mild: Secondary | ICD-10-CM

## 2020-09-21 DIAGNOSIS — E781 Pure hyperglyceridemia: Secondary | ICD-10-CM

## 2020-09-21 LAB — BAYER DCA HB A1C WAIVED: HB A1C (BAYER DCA - WAIVED): 7.3 % — ABNORMAL HIGH (ref <7.0)

## 2020-09-21 MED ORDER — METFORMIN HCL 1000 MG PO TABS
1000.0000 mg | ORAL_TABLET | Freq: Two times a day (BID) | ORAL | 1 refills | Status: DC
Start: 2020-09-21 — End: 2020-12-25

## 2020-09-21 MED ORDER — CITALOPRAM HYDROBROMIDE 40 MG PO TABS: 40.0000 mg | ORAL_TABLET | Freq: Every day | ORAL | 1 refills | Status: DC

## 2020-09-21 MED ORDER — INSULIN REGULAR HUMAN 100 UNIT/ML IJ SOLN: INTRAMUSCULAR | 5 refills | Status: DC

## 2020-09-21 MED ORDER — INSULIN NPH (HUMAN) (ISOPHANE) 100 UNIT/ML ~~LOC~~ SUSP: SUBCUTANEOUS | 5 refills | Status: DC

## 2020-09-21 MED ORDER — SIMVASTATIN 40 MG PO TABS: 40.0000 mg | ORAL_TABLET | Freq: Every day | ORAL | 1 refills | Status: DC

## 2020-09-21 MED ORDER — GABAPENTIN 400 MG PO CAPS: ORAL_CAPSULE | ORAL | 1 refills | Status: DC

## 2020-09-21 MED ORDER — DILTIAZEM HCL ER COATED BEADS 240 MG PO CP24: 240.0000 mg | ORAL_CAPSULE | Freq: Every day | ORAL | 1 refills | Status: DC

## 2020-09-21 MED ORDER — LISINOPRIL-HYDROCHLOROTHIAZIDE 20-25 MG PO TABS: 1.0000 | ORAL_TABLET | Freq: Every day | ORAL | 1 refills | Status: DC

## 2020-09-21 NOTE — Progress Notes (Signed)
Subjective:    Patient ID: Adrienne Cook, female    DOB: [birth year], 80 y.o.   MRN: [id number]   Chief Complaint: Medical Management of Chronic Issues    HPI:  1. Essential hypertension No c/o chest pain, sob or headache. Does not check blood pressure at home. BP Readings from Last 3 Encounters:  09/21/20 132/66  03/21/20 124/67  12/21/19 119/64     2. Controlled type 2 diabetes with neuropathy ([place]) Fating blood sugars are running around 110-over 200. SHe denise any low blood sugars. She doe snot watch diet very closely. Lab Results  Component Value Date   HGBA1C 7.2 (H) 03/21/2020    3. Mixed hyperlipidemia Doe snot watch diet and does no dedicated exercises. Lab Results  Component Value Date   CHOL 147 03/21/2020   HDL 48 03/21/2020   LDLCALC 73 03/21/2020   TRIG 148 03/21/2020   CHOLHDL 3.1 03/21/2020   The 10-year ASCVD risk score Mikey Bussing DC Jr., et al., 2013) is: 45.1%   4. HYPERCHOLESTEROLEMIA See above  5. Obstructive sleep apnea syndrome She does not wear CPAP machine. Says she cannot sleep with it on.  6. Diverticulosis No recent flare ups.she tries to avoid foods that cause her to flare up  7. Renal insufficiency No problems voiding Lab Results  Component Value Date   CREATININE 1.20 (H) 03/21/2020     8. Class 3 severe obesity due to excess calories with serious comorbidity and body mass index (BMI) greater than or equal to 70 in adult [facility]) No recent weight changes Wt Readings from Last 3 Encounters:  09/21/20 218 lb (98.9 kg)  03/21/20 218 lb (98.9 kg)  12/21/19 215 lb (97.5 kg)   BMI Readings from Last 3 Encounters:  09/21/20 35.19 kg/m  03/21/20 35.19 kg/m  12/21/19 34.70 kg/m       Outpatient Encounter Medications as of 09/21/2020  Medication Sig  . aspirin 81 MG EC tablet Take 1 tablet (81 mg total) by mouth daily.  . calcium elemental as carbonate (BARIATRIC TUMS ULTRA) 400 MG chewable tablet Chew 1,000 mg by mouth 3  (three) times daily.  . Cholecalciferol (VITAMIN D3) 5000 UNITS TABS Take 5,000 Units by mouth daily.  . citalopram (CELEXA) 40 MG tablet Take 1 tablet (40 mg total) by mouth daily.  [name] diltiazem (CARDIZEM CD) 240 MG 24 hr capsule Take 1 capsule (240 mg total) by mouth daily.  . fluticasone (FLONASE) 50 MCG/ACT nasal spray SPRAY 2 SPRAYS INTO EACH NOSTRIL EVERY DAY  . gabapentin (NEURONTIN) 400 MG capsule TAKE 1 TO 3 CAPSULES BY MOUTH AT BEDTIME  . glucose blood test strip Use as instructed  . insulin NPH Human (HUMULIN N) 100 UNIT/ML injection INJECT 0.4 MLS (40 UNITS TOTAL) INTO THE SKIN 2 (TWO) TIMES DAILY.  [name] insulin regular (HUMULIN R) 100 units/mL injection INJECT 10 TO 20 UNITS SUBCUTANEOUSLY THREE TIMES DAILY BEFORE MEAL(S)  . Insulin Syringe-Needle U-100 (BD INSULIN SYRINGE U/F) 30G X 1/2" 0.5 ML MISC Inject into skin 5 times daily Dx E10.65  . lisinopril-hydrochlorothiazide (ZESTORETIC) 20-25 MG tablet Take 1 tablet by mouth daily.  . metFORMIN (GLUCOPHAGE) 1000 MG tablet Take 1 tablet (1,000 mg total) by mouth 2 (two) times daily with a meal.  . ONETOUCH ULTRA test strip TEST TWICE DAILY E11.9  . simvastatin (ZOCOR) 40 MG tablet Take 1 tablet (40 mg total) by mouth daily.     Past Surgical History:  Procedure Laterality Date  . APPENDECTOMY    .  BREAST CYST EXCISION     benign  . CARPAL TUNNEL RELEASE    . CHOLECYSTECTOMY    . COLONOSCOPY  02/13/2012  . EXAMINATION UNDER ANESTHESIA     and sphincterotomy  . HAMMER TOE SURGERY     bilateral; 5th toes  . LEFT HEART CATH  02/28/2012   Procedure: LEFT HEART CATH;  Surgeon: Hillary Bow, MD;  Location: [facility] CATH LAB;  Service: Cardiovascular;;  . LEFT HEART CATHETERIZATION WITH CORONARY ANGIOGRAM N/A 02/28/2012   Procedure: LEFT HEART CATHETERIZATION WITH CORONARY ANGIOGRAM;  Surgeon: Hillary Bow, MD;  Location: [facility] CATH LAB;  Service: Cardiovascular;  Laterality: N/A;  . VAGINAL HYSTERECTOMY  1978    Family History   Problem Relation Age of Onset  . Heart failure Mother        CHF   . COPD Father   . Cancer Brother        prostate  . Kidney Stones Brother   . Epilepsy Daughter   . Kidney Stones Daughter   . Colon cancer Neg Hx   . Stomach cancer Neg Hx     New complaints: None today  Social history: Lives with her daughter  Controlled substance contract: n/a    Review of Systems  Constitutional: Negative for diaphoresis.  Eyes: Negative for pain.  Respiratory: Negative for shortness of breath.   Cardiovascular: Negative for chest pain, palpitations and leg swelling.  Gastrointestinal: Negative for abdominal pain.  Endocrine: Negative for polydipsia.  Skin: Negative for rash.  Neurological: Negative for dizziness, weakness and headaches.  Hematological: Does not bruise/bleed easily.  All other systems reviewed and are negative.      Objective:   Physical Exam Vitals and nursing note reviewed.  Constitutional:      General: She is not in acute distress.    Appearance: Normal appearance. She is well-developed and well-nourished.  HENT:     Head: Normocephalic.     Nose: Nose normal.     Mouth/Throat:     Mouth: Oropharynx is clear and moist.  Eyes:     Extraocular Movements: EOM normal.     Pupils: Pupils are equal, round, and reactive to light.  Neck:     Vascular: No carotid bruit or JVD.  Cardiovascular:     Rate and Rhythm: Normal rate and regular rhythm.     Pulses: Intact distal pulses.     Heart sounds: Normal heart sounds.  Pulmonary:     Effort: Pulmonary effort is normal. No respiratory distress.     Breath sounds: Normal breath sounds. No wheezing or rales.  Chest:     Chest wall: No tenderness.  Abdominal:     General: Bowel sounds are normal. There is no distension or abdominal bruit. Aorta is normal.     Palpations: Abdomen is soft. There is no hepatomegaly, splenomegaly, mass or pulsatile mass.     Tenderness: There is no abdominal tenderness.   Musculoskeletal:        General: No edema. Normal range of motion.     Cervical back: Normal range of motion and neck supple.  Lymphadenopathy:     Cervical: No cervical adenopathy.  Skin:    General: Skin is warm and dry.  Neurological:     Mental Status: She is alert and oriented to person, place, and time.     Deep Tendon Reflexes: Reflexes are normal and symmetric.  Psychiatric:        Mood and Affect: Mood and affect normal.  Behavior: Behavior normal.        Thought Content: Thought content normal.        Judgment: Judgment normal.     BP 132/66   Pulse 80   Temp 97.8 F (36.6 C) (Temporal)   Resp 20   Ht $R'5\' 6"'nB$  (1.676 m)   Wt 218 lb (98.9 kg)   SpO2 95%   BMI 35.19 kg/m   HGBA1c 7.3%     Assessment & Plan:  DONICIA DRUCK comes in today with chief complaint of Medical Management of Chronic Issues   Diagnosis and orders addressed:  1. Essential hypertension Low sodium diet - CBC with Differential/Platelet - CMP14+EGFR - diltiazem (CARDIZEM CD) 240 MG 24 hr capsule; Take 1 capsule (240 mg total) by mouth daily.  Dispense: 90 capsule; Refill: 1 - lisinopril-hydrochlorothiazide (ZESTORETIC) 20-25 MG tablet; Take 1 tablet by mouth daily.  Dispense: 90 tablet; Refill: 1  2. Controlled type 2 diabetes with neuropathy (H[facility]) Continue to watch carbs in diet - Bayer DCA Hb A1c Waived - insulin regular (HUMULIN R) 100 units/mL injection; INJECT 10 TO 20 UNITS SUBCUTANEOUSLY THREE TIMES DAILY BEFORE MEAL(S)  Dispense: 30 mL; Refill: 5 - metFORMIN (GLUCOPHAGE) 1000 MG tablet; Take 1 tablet (1,000 mg total) by mouth 2 (two) times daily with a meal.  Dispense: 180 tablet; Refill: 1 - gabapentin (NEURONTIN) 400 MG capsule; TAKE 1 TO 3 CAPSULES BY MOUTH AT BEDTIME  Dispense: 270 capsule; Refill: 1 - insulin NPH Human (HUMULIN N) 100 UNIT/ML injection; INJECT 0.4 MLS (40 UNITS TOTAL) INTO THE SKIN 2 (TWO) TIMES DAILY.  Dispense: 70 mL; Refill: 5  3. Mixed  hyperlipidemia Low fat diet - Lipid panel - simvastatin (ZOCOR) 40 MG tablet; Take 1 tablet (40 mg total) by mouth daily.  Dispense: 90 tablet; Refill: 1  4. HYPERCHOLESTEROLEMIA  5. Obstructive sleep apnea syndrome Refuses to wear CPAP  6. Diverticulosis watch diet to prevent flare up  7. Renal insufficiency Lab spending  8. Class 3 severe obesity due to excess calories with serious comorbidity and body mass index (BMI) greater than or equal to 70 in adult [facility]) Discussed diet and exercise for person with BMI >25 Will recheck weight in 3-6 months  9. Depression, major, single episode, mild (HCC) Stress manageme - citalopram (CELEXA) 40 MG tablet; Take 1 tablet (40 mg total) by mouth daily.  Dispense: 90 tablet; Refill: 1    Labs pending Health Maintenance reviewed Diet and exercise encouraged  Follow up plan: 3 months   Mary-Margaret Hassell Done, FNP

## 2020-09-22 LAB — CMP14+EGFR
ALT: 18 IU/L (ref 0–32)
AST: 18 IU/L (ref 0–40)
Albumin/Globulin Ratio: 1.6 (ref 1.2–2.2)
Albumin: 4.2 g/dL (ref 3.7–4.7)
Alkaline Phosphatase: 72 IU/L (ref 44–121)
BUN/Creatinine Ratio: 18 (ref 12–28)
BUN: 20 mg/dL (ref 8–27)
Bilirubin Total: 0.3 mg/dL (ref 0.0–1.2)
CO2: 21 mmol/L (ref 20–29)
Calcium: 9.3 mg/dL (ref 8.7–10.3)
Chloride: 101 mmol/L (ref 96–106)
Creatinine, Ser: 1.13 mg/dL — ABNORMAL HIGH (ref 0.57–1.00)
GFR calc Af Amer: 54 mL/min/{1.73_m2} — ABNORMAL LOW (ref >59)
GFR calc non Af Amer: 47 mL/min/{1.73_m2} — ABNORMAL LOW (ref >59)
Globulin, Total: 2.6 g/dL (ref 1.5–4.5)
Glucose: 247 mg/dL — ABNORMAL HIGH (ref 65–99)
Potassium: 4.8 mmol/L (ref 3.5–5.2)
Sodium: 137 mmol/L (ref 134–144)
Total Protein: 6.8 g/dL (ref 6.0–8.5)

## 2020-09-22 LAB — CBC WITH DIFFERENTIAL/PLATELET
Basophils Absolute: 0.1 10*3/uL (ref 0.0–0.2)
Basos: 1 %
EOS (ABSOLUTE): 0.2 10*3/uL (ref 0.0–0.4)
Eos: 3 %
Hematocrit: 38.3 % (ref 34.0–46.6)
Hemoglobin: 12.3 g/dL (ref 11.1–15.9)
Immature Grans (Abs): 0 10*3/uL (ref 0.0–0.1)
Immature Granulocytes: 0 %
Lymphocytes Absolute: 2 10*3/uL (ref 0.7–3.1)
Lymphs: 25 %
MCH: 28.7 pg (ref 26.6–33.0)
MCHC: 32.1 g/dL (ref 31.5–35.7)
MCV: 90 fL (ref 79–97)
Monocytes Absolute: 0.5 10*3/uL (ref 0.1–0.9)
Monocytes: 7 %
Neutrophils Absolute: 5.2 10*3/uL (ref 1.4–7.0)
Neutrophils: 64 %
Platelets: 374 10*3/uL (ref 150–450)
RBC: 4.28 x10E6/uL (ref 3.77–5.28)
RDW: 13.8 % (ref 11.7–15.4)
WBC: 8.1 10*3/uL (ref 3.4–10.8)

## 2020-09-22 LAB — LIPID PANEL
Chol/HDL Ratio: 3 ratio (ref 0.0–4.4)
Cholesterol, Total: 131 mg/dL (ref 100–199)
HDL: 44 mg/dL (ref >39)
LDL Chol Calc (NIH): 65 mg/dL (ref 0–99)
Triglycerides: 125 mg/dL (ref 0–149)
VLDL Cholesterol Cal: 22 mg/dL (ref 5–40)

## 2020-10-25 ENCOUNTER — Encounter: Payer: Self-pay | Admitting: Nurse Practitioner

## 2020-10-25 ENCOUNTER — Telehealth (INDEPENDENT_AMBULATORY_CARE_PROVIDER_SITE_OTHER): Payer: Medicare Other | Admitting: Nurse Practitioner

## 2020-10-25 DIAGNOSIS — J069 Acute upper respiratory infection, unspecified: Secondary | ICD-10-CM | POA: Insufficient documentation

## 2020-10-25 MED ORDER — BENZONATATE 100 MG PO CAPS: 100.0000 mg | ORAL_CAPSULE | Freq: Three times a day (TID) | ORAL | 0 refills | Status: DC | PRN

## 2020-10-25 MED ORDER — AMOXICILLIN-POT CLAVULANATE 875-125 MG PO TABS: 1.0000 | ORAL_TABLET | Freq: Two times a day (BID) | ORAL | 0 refills | Status: DC

## 2020-10-25 NOTE — Progress Notes (Signed)
   Virtual Visit via telephone Note Due to COVID-19 pandemic this visit was conducted virtually. This visit type was conducted due to national recommendations for restrictions regarding the COVID-19 Pandemic (e.g. social distancing, sheltering in place) in an effort to limit this patient's exposure and mitigate transmission in our community. All issues noted in this document were discussed and addressed.  A physical exam was not performed with this format.  I connected with Adrienne Cook on 10/25/20 at 08:50 am by telephone and verified that I am speaking with the correct person using two identifiers. Adrienne Cook is currently located at home during visit. The provider, Ivy Lynn, NP is located in their office at time of visit.  I discussed the limitations, risks, security and privacy concerns of performing an evaluation and management service by telephone and the availability of in person appointments. I also discussed with the patient that there may be a patient responsible charge related to this service. The patient expressed understanding and agreed to proceed.   History and Present Illness:  URI  This is a new problem. The problem has been unchanged. There has been no fever. Associated symptoms include coughing, nausea and a sore throat. Pertinent negatives include no abdominal pain, chest pain, headaches or vomiting. She has tried nothing for the symptoms. The treatment provided no relief.      Review of Systems  Constitutional: Negative for chills and fever.  HENT: Positive for sore throat.   Respiratory: Positive for cough.   Cardiovascular: Negative for chest pain.  Gastrointestinal: Positive for nausea. Negative for abdominal pain and vomiting.  Neurological: Negative for headaches.  All other systems reviewed and are negative.    Observations/Objective: Tele-visit. Patient did not sound to be in distress  Assessment and Plan: Upper respiratory infection with  cough and congestion Symptoms not well managed. Benzonate 100 mg for cough.  Augmentin 875-125.  Increase hydration, monitor oxygen saturation and fever. Rx sent to pharmacy,  Follow up with worsening or unresolved symptoms   Follow Up Instructions:   Follow up with worsening or unresolved symptoms    I discussed the assessment and treatment plan with the patient. The patient was provided an opportunity to ask questions and all were answered. The patient agreed with the plan and demonstrated an understanding of the instructions.   The patient was advised to call back or seek an in-person evaluation if the symptoms worsen or if the condition fails to improve as anticipated.  The above assessment and management plan was discussed with the patient. The patient verbalized understanding of and has agreed to the management plan. Patient is aware to call the clinic if symptoms persist or worsen. Patient is aware when to return to the clinic for a follow-up visit. Patient educated on when it is appropriate to go to the emergency department.   Time call ended:  09:01  I provided 11 minutes of non-face-to-face time during this encounter.    Ivy Lynn, NP

## 2020-10-25 NOTE — Assessment & Plan Note (Signed)
Symptoms not well managed. Benzonate 100 mg for cough.  Augmentin 875-125.  Increase hydration, monitor oxygen saturation and fever. Rx sent to pharmacy,  Follow up with worsening or unresolved symptoms

## 2020-11-10 ENCOUNTER — Ambulatory Visit (INDEPENDENT_AMBULATORY_CARE_PROVIDER_SITE_OTHER): Payer: Medicare Other | Admitting: *Deleted

## 2020-11-10 VITALS — BP 132/66 | Ht 66.0 in | Wt 218.0 lb

## 2020-11-10 DIAGNOSIS — Z0000 Encounter for general adult medical examination without abnormal findings: Secondary | ICD-10-CM | POA: Diagnosis not present

## 2020-11-10 NOTE — Patient Instructions (Signed)
  Adrienne Cook , Thank you for taking time to come for your Medicare Wellness Visit. I appreciate your ongoing commitment to your health goals. Please review the following plan we discussed and let me know if I can assist you in the future.   These are the goals we discussed: Goals    . DIET - INCREASE WATER INTAKE     Try to drink 6-8 glasses of water daily.    . Exercise 150 min/wk Moderate Activity       This is a list of the screening recommended for you and due dates:  Health Maintenance  Topic Date Due  . Eye exam for diabetics  12/20/2020*  .  Hepatitis C: One time screening is recommended by Center for Disease Control  (CDC) for  adults born from [birth year] through 1963.   03/21/2021*  . Hemoglobin A1C  03/21/2021  . Complete foot exam   09/21/2021  . Mammogram  03/28/2022  . Tetanus Vaccine  06/29/2028  . Flu Shot  Completed  . DEXA scan (bone density measurement)  Completed  . COVID-19 Vaccine  Completed  . Pneumonia vaccines  Completed  *Topic was postponed. The date shown is not the original due date.

## 2020-11-10 NOTE — Progress Notes (Signed)
MEDICARE ANNUAL WELLNESS VISIT  11/10/2020  Telephone Visit Disclaimer This Medicare AWV was conducted by telephone due to national recommendations for restrictions regarding the COVID-19 Pandemic (e.g. social distancing).  I verified, using two identifiers, that I am speaking with Adrienne Cook or their authorized healthcare agent. I discussed the limitations, risks, security, and privacy concerns of performing an evaluation and management service by telephone and the potential availability of an in-person appointment in the future. The patient expressed understanding and agreed to proceed.  Location of Patient: in her home Location of Provider (nurse):  In office  Subjective:    Adrienne Cook is a 80 y.o. female patient of Chevis Pretty, [place] who had a Medicare Annual Wellness Visit today via telephone. Adrienne Cook is Retired and lives with their family. she has 1 child. she reports that she is socially active and does interact with friends/family regularly. she is minimally physically active and enjoys crafting and reading.  Patient Care Team: Chevis Pretty, FNP as PCP - General (Family Medicine)  Advanced Directives 11/10/2020 07/01/2019 12/25/2016 10/27/2014 02/28/2012  Does Patient Have a Medical Advance Directive? No No No No Patient does not have advance directive  Would patient like information on creating a medical advance directive? No - Patient declined No - Patient declined - No - patient declined information -  Pre-existing out of facility DNR order (yellow form or pink MOST form) - - - - No    Hospital Utilization Over the Past 12 Months: # of hospitalizations or ER visits: 0 # of surgeries: 0  Review of Systems    Patient reports that her overall health is unchanged compared to last year.  General ROS: negative  Patient Reported Readings (BP, Pulse, CBG, Weight, etc) BP 132/66   Ht 5\' 6"  (1.676 m)   Wt 218 lb (98.9 kg)   BMI 35.19 kg/m    Pain  Assessment       Current Medications & Allergies (verified) Allergies as of 11/10/2020   No Known Allergies     Medication List       Accurate as of November 10, 2020  3:51 PM. If you have any questions, ask your nurse or doctor.        STOP taking these medications   amoxicillin-clavulanate 875-125 MG tablet Commonly known as: Augmentin     TAKE these medications   aspirin 81 MG EC tablet Take 1 tablet (81 mg total) by mouth daily.   BD Insulin Syringe U/F 30G X 1/2" 0.5 ML Misc Generic drug: Insulin Syringe-Needle U-100 Inject into skin 5 times daily Dx E10.65   benzonatate 100 MG capsule Commonly known as: Tessalon Perles Take 1 capsule (100 mg total) by mouth 3 (three) times daily as needed for cough.   calcium elemental as carbonate 400 MG chewable tablet Commonly known as: BARIATRIC TUMS ULTRA Chew 1,000 mg by mouth 3 (three) times daily.   citalopram 40 MG tablet Commonly known as: CELEXA Take 1 tablet (40 mg total) by mouth daily.   diltiazem 240 MG 24 hr capsule Commonly known as: CARDIZEM CD Take 1 capsule (240 mg total) by mouth daily.   fluticasone 50 MCG/ACT nasal spray Commonly known as: FLONASE SPRAY 2 SPRAYS INTO EACH NOSTRIL EVERY DAY   gabapentin 400 MG capsule Commonly known as: NEURONTIN TAKE 1 TO 3 CAPSULES BY MOUTH AT BEDTIME   insulin NPH Human 100 UNIT/ML injection Commonly known as: HumuLIN N INJECT 0.4 MLS (40 UNITS TOTAL) INTO  THE SKIN 2 (TWO) TIMES DAILY.   insulin regular 100 units/mL injection Commonly known as: HumuLIN R INJECT 10 TO 20 UNITS SUBCUTANEOUSLY THREE TIMES DAILY BEFORE MEAL(S)   lisinopril-hydrochlorothiazide 20-25 MG tablet Commonly known as: ZESTORETIC Take 1 tablet by mouth daily.   metFORMIN 1000 MG tablet Commonly known as: GLUCOPHAGE Take 1 tablet (1,000 mg total) by mouth 2 (two) times daily with a meal.   OneTouch Ultra test strip Generic drug: glucose blood TEST TWICE DAILY E11.9   glucose  blood test strip Use as instructed   simvastatin 40 MG tablet Commonly known as: ZOCOR Take 1 tablet (40 mg total) by mouth daily.   Vitamin D3 125 MCG (5000 UT) Tabs Take 5,000 Units by mouth daily.       History (reviewed): Past Medical History:  Diagnosis Date  . 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 Diabetes mellitus without complication ([place])   . Hypercholesterolemia   . Hypertension   . Irregular heartbeat    a. normal event monitor  . Obesity   . PONV (postoperative nausea and vomiting)   . Seasonal allergies   . Sleep apnea    does not tolerate CPAP   Past Surgical History:  Procedure Laterality Date  . APPENDECTOMY    . BREAST CYST EXCISION     benign  . CARPAL TUNNEL RELEASE    . CHOLECYSTECTOMY    . COLONOSCOPY  02/13/2012  . EXAMINATION UNDER ANESTHESIA     and sphincterotomy  . HAMMER TOE SURGERY     bilateral; 5th toes  . LEFT HEART CATH  02/28/2012   Procedure: LEFT HEART CATH;  Surgeon: Hillary Bow, MD;  Location: [facility] C[facility] LAB;  Service: Cardiovascular;;  . LEFT HEART CATHETERIZATION WITH CORONARY ANGIOGRAM N/A 02/28/2012   Procedure: LEFT HEART CATHETERIZATION WITH CORONARY ANGIOGRAM;  Surgeon: Hillary Bow, MD;  Location: [facility] CATH LAB;  Service: Cardiovascular;  Laterality: N/A;  . VAGINAL HYSTERECTOMY  1978   Family History  Problem Relation Age of Onset  . Heart failure Mother        CHF   . COPD Father   . Cancer Brother        prostate  . Kidney Stones Brother   . Epilepsy Daughter   . Kidney Stones Daughter   . Colon cancer Neg Hx   . Stomach cancer Neg Hx    Social History   Socioeconomic History  . Marital status: Legally Separated    Spouse name: Not on file  . Number of children: 1  . Years of education: 13  . Highest education level: 12th grade  Occupational History  . Occupation: Retired    Comment: Passenger transport manager  Tobacco Use  . Smoking status: Never Smoker  . Smokeless tobacco: Never Used  . Tobacco comment:  tobacco use - no  Vaping Use  . Vaping Use: Never used  Substance and Sexual Activity  . Alcohol use: No  . Drug use: No  . Sexual activity: Not Currently    Birth control/protection: Surgical    Comment: hyst  Other Topics Concern  . Not on file  Social History Narrative   Lives with daughter    Social Determinants of Health   Financial Resource Strain: Not on file  Food Insecurity: Not on file  Transportation Needs: Not on file  Physical Activity: Not on file  Stress: Not on file  Social Connections: Not on file    Activities of Daily Living In your  present state of health, do you have any difficulty performing the following activities: 11/10/2020 09/21/2020  Hearing? N N  Vision? Y N  Comment glasses = rx -  Difficulty concentrating or making decisions? N N  Walking or climbing stairs? N N  Dressing or bathing? N N  Doing errands, shopping? N N  Preparing Food and eating ? N -  Using the Toilet? N -  In the past six months, have you accidently leaked urine? N -  Do you have problems with loss of bowel control? N -  Managing your Medications? N -  Managing your Finances? N -  Housekeeping or managing your Housekeeping? N -  Some recent data might be hidden    Patient Education/ Literacy    Exercise Current Exercise Habits: The patient does not participate in regular exercise at present (recent covid), Exercise limited by: None identified  Diet Patient reports consuming 2 meals a day and 1 snack(s) a day Patient reports that her primary diet is: Regular Patient reports that she does have regular access to food.   Depression Screen PHQ 2/9 Scores 11/10/2020 09/21/2020 03/21/2020 12/21/2019 09/22/2019 07/01/2019 03/08/2019  PHQ - 2 Score 0 0 0 0 0 0 0  PHQ- 9 Score - 0 - - - - -     Fall Risk Fall Risk  11/10/2020 09/21/2020 03/21/2020 12/21/2019 09/22/2019  Falls in the past year? 0 0 0 0 0  Number falls in past yr: - - - - -  Injury with Fall? - - - - -  Comment - - - - -   Risk Factor Category  - - - - -  Follow up - - - - -  Comment - - - - -     Objective:  Adrienne Cook seemed alert and oriented and she participated appropriately during our telephone visit.  Blood Pressure Weight BMI  BP Readings from Last 3 Encounters:  11/10/20 132/66  09/21/20 132/66  03/21/20 124/67   Wt Readings from Last 3 Encounters:  11/10/20 218 lb (98.9 kg)  09/21/20 218 lb (98.9 kg)  03/21/20 218 lb (98.9 kg)   BMI Readings from Last 1 Encounters:  11/10/20 35.19 kg/m    *Unable to obtain current vital signs, weight, and BMI due to telephone visit type  Hearing/Vision  . Sasha did not seem to have difficulty with hearing/understanding during the telephone conversation . Reports that she has not had a formal eye exam by an eye care professional within the past year . Reports that she has not had a formal hearing evaluation within the past year *Unable to fully assess hearing and vision during telephone visit type  Cognitive Function: 6CIT Screen 11/10/2020 07/01/2019  What Year? 0 points 0 points  What month? 0 points 0 points  What time? 0 points 0 points  Count back from 20 0 points 0 points  Months in reverse 0 points 0 points  Repeat phrase - 2 points  Total Score - 2   (Normal:0-7, Significant for Dysfunction: >8)  Normal Cognitive Function Screening: Yes   Immunization & Health Maintenance Record Immunization History  Administered Date(s) Administered  . Fluad Quad(high Dose 65+) 06/15/2019  . Influenza, High Dose Seasonal PF 06/29/2016, 07/07/2017, 06/29/2018  . Influenza-Unspecified 07/10/2020, 07/10/2020  . Moderna Sars-Covid-2 Vaccination 11/01/2019, 11/29/2019, 09/13/2020  . Pneumococcal Conjugate-13 10/09/2015  . Pneumococcal Polysaccharide-23 07/10/2012  . Tdap 06/29/2018    Health Maintenance  Topic Date Due  . OPHTHALMOLOGY EXAM  12/20/2020 (  Originally 03/22/2020)  . Hepatitis C Screening  03/21/2021 (Originally 03-14-1941)  .  HEMOGLOBIN A1C  03/21/2021  . FOOT EXAM  09/21/2021  . MAMMOGRAM  03/28/2022  . TETANUS/TDAP  06/29/2028  . INFLUENZA VACCINE  Completed  . DEXA SCAN  Completed  . COVID-19 Vaccine  Completed  . PNA vac Low Risk Adult  Completed       Assessment  This is a routine wellness examination for Adrienne Cook.  Health Maintenance: Due or Overdue There are no preventive care reminders to display for this patient.  Adrienne Cook does not need a referral for Community Assistance: Care Management:   no Social Work:    no Prescription Assistance:  no Nutrition/Diabetes Education:  no   Plan:  Personalized Goals Goals Addressed            This Visit's Progress   . DIET - INCREASE WATER INTAKE   On track    Try to drink 6-8 glasses of water daily.    . Exercise 150 min/wk Moderate Activity   Not on track     Personalized Health Maintenance & Screening Recommendations  up to date  Lung Cancer Screening Recommended: no (Low Dose CT Chest recommended if Age 53-80 years, 30 pack-year currently smoking OR have quit w/in past 15 years) Hepatitis C Screening recommended: no HIV Screening recommended: no  Advanced Directives: Written information was not prepared per patient's request.  Referrals & Orders No orders of the defined types were placed in this encounter.   Follow-up Plan . Follow-up with Chevis Pretty, FNP as planned    I have personally reviewed and noted the following in the patient's chart:   . Medical and social history . Use of alcohol, tobacco or illicit drugs  . Current medications and supplements . Functional ability and status . Nutritional status . Physical activity . Advanced directives . List of other physicians . Hospitalizations, surgeries, and ER visits in previous 12 months . Vitals . Screenings to include cognitive, depression, and falls . Referrals and appointments  In addition, I have reviewed and discussed with Adrienne Cook  certain preventive protocols, quality metrics, and best practice recommendations. A written personalized care plan for preventive services as well as general preventive health recommendations is available and can be mailed to the patient at her request.      [place] Dec  11/10/2020

## 2020-11-24 ENCOUNTER — Other Ambulatory Visit: Payer: Self-pay | Admitting: Nurse Practitioner

## 2020-11-24 DIAGNOSIS — J301 Allergic rhinitis due to pollen: Secondary | ICD-10-CM

## 2020-12-25 ENCOUNTER — Other Ambulatory Visit: Payer: Self-pay

## 2020-12-25 ENCOUNTER — Ambulatory Visit (INDEPENDENT_AMBULATORY_CARE_PROVIDER_SITE_OTHER): Payer: Medicare Other | Admitting: Nurse Practitioner

## 2020-12-25 ENCOUNTER — Encounter: Payer: Self-pay | Admitting: Nurse Practitioner

## 2020-12-25 VITALS — BP 131/67 | HR 80 | Temp 97.8°F | Resp 20 | Ht 66.0 in | Wt 220.0 lb

## 2020-12-25 DIAGNOSIS — F320 Major depressive disorder, single episode, mild: Secondary | ICD-10-CM

## 2020-12-25 DIAGNOSIS — I10 Essential (primary) hypertension: Secondary | ICD-10-CM

## 2020-12-25 DIAGNOSIS — E782 Mixed hyperlipidemia: Secondary | ICD-10-CM | POA: Diagnosis not present

## 2020-12-25 DIAGNOSIS — G4733 Obstructive sleep apnea (adult) (pediatric): Secondary | ICD-10-CM

## 2020-12-25 DIAGNOSIS — E1142 Type 2 diabetes mellitus with diabetic polyneuropathy: Secondary | ICD-10-CM | POA: Diagnosis not present

## 2020-12-25 DIAGNOSIS — M545 Low back pain, unspecified: Secondary | ICD-10-CM

## 2020-12-25 DIAGNOSIS — K5790 Diverticulosis of intestine, part unspecified, without perforation or abscess without bleeding: Secondary | ICD-10-CM | POA: Diagnosis not present

## 2020-12-25 DIAGNOSIS — Z794 Long term (current) use of insulin: Secondary | ICD-10-CM

## 2020-12-25 DIAGNOSIS — E1065 Type 1 diabetes mellitus with hyperglycemia: Secondary | ICD-10-CM | POA: Diagnosis not present

## 2020-12-25 LAB — MICROSCOPIC EXAMINATION

## 2020-12-25 LAB — URINALYSIS, COMPLETE
Bilirubin, UA: NEGATIVE
Glucose, UA: NEGATIVE
Leukocytes,UA: NEGATIVE
Nitrite, UA: NEGATIVE
Protein,UA: NEGATIVE
RBC, UA: NEGATIVE
Specific Gravity, UA: >1.03 — ABNORMAL HIGH (ref 1.005–1.030)
Urobilinogen, Ur: 0.2 mg/dL (ref 0.2–1.0)
pH, UA: 5 (ref 5.0–7.5)

## 2020-12-25 LAB — BAYER DCA HB A1C WAIVED: HB A1C (BAYER DCA - WAIVED): 7.5 % — ABNORMAL HIGH (ref <7.0)

## 2020-12-25 MED ORDER — METFORMIN HCL 1000 MG PO TABS: 1000.0000 mg | ORAL_TABLET | Freq: Two times a day (BID) | ORAL | 1 refills | Status: DC

## 2020-12-25 MED ORDER — "BD INSULIN SYRINGE U/F 30G X 1/2"" 0.5 ML MISC": 3 refills | Status: DC

## 2020-12-25 MED ORDER — CITALOPRAM HYDROBROMIDE 40 MG PO TABS: 40.0000 mg | ORAL_TABLET | Freq: Every day | ORAL | 1 refills | Status: DC

## 2020-12-25 MED ORDER — LISINOPRIL-HYDROCHLOROTHIAZIDE 20-25 MG PO TABS: 1.0000 | ORAL_TABLET | Freq: Every day | ORAL | 1 refills | Status: DC

## 2020-12-25 MED ORDER — DILTIAZEM HCL ER COATED BEADS 240 MG PO CP24: 240.0000 mg | ORAL_CAPSULE | Freq: Every day | ORAL | 1 refills | Status: DC

## 2020-12-25 MED ORDER — INSULIN NPH (HUMAN) (ISOPHANE) 100 UNIT/ML ~~LOC~~ SUSP: SUBCUTANEOUS | 5 refills | Status: DC

## 2020-12-25 MED ORDER — GABAPENTIN 400 MG PO CAPS: ORAL_CAPSULE | ORAL | 1 refills | Status: DC

## 2020-12-25 MED ORDER — INSULIN REGULAR HUMAN 100 UNIT/ML IJ SOLN: INTRAMUSCULAR | 5 refills | Status: DC

## 2020-12-25 MED ORDER — SIMVASTATIN 40 MG PO TABS: 40.0000 mg | ORAL_TABLET | Freq: Every day | ORAL | 1 refills | Status: DC

## 2020-12-25 NOTE — Patient Instructions (Signed)

## 2020-12-25 NOTE — Progress Notes (Addendum)
Subjective:    Patient ID: Adrienne Cook, female    DOB: Apr 01, 1941, 80 y.o.   MRN: [id number]   Chief Complaint: Follow up for chronic disease management.   HPI:  1. Primary hypertension On lisinopril-hctz and diltiazem. Does not monitor BP at home. No issues with low Bps, HA, SOB, CP, heart palps.   BP Readings from Last 3 Encounters:  12/25/20 131/67  11/10/20 132/66  09/21/20 132/66    2. Type 2 diabetes mellitus without complications.   Does monitor FBG at home. FBG runs between 105-167.  No issues with low blood sugars. Currently controlling with insulin, regular and NPH, and metformin. Eye appointment this month. No issues with her feet. No changes in her diet, but has had increase in stress at home.   Lab Results  Component Value Date   HGBA1C 7.3 (H) 09/21/2020    3. Mixed hyperlipidemia Controlled with simvastatin. Does not follow low fat diet.   Lab Results  Component Value Date   CHOL 131 09/21/2020   HDL 44 09/21/2020   LDLCALC 65 09/21/2020   TRIG 125 09/21/2020   CHOLHDL 3.0 09/21/2020   The 10-year ASCVD risk score Mikey Bussing DC Jr., et al., 2013) is: 48.4%   Values used to calculate the score:     Age: 61 years     Sex: Female     Is Non-Hispanic African American: No     Diabetic: Yes     Tobacco smoker: No     Systolic Blood Pressure: 017 mmHg     Is BP treated: Yes     HDL Cholesterol: 44 mg/dL     Total Cholesterol: 131 mg/dL  4. Controlled type 2 diabetes with neuropathy (HCC) On gabapentin daily. Working well for her. She states her neuropathy has not worsened.   5. Depression, major, single episode, mild ([place]) On citalopram. Believes is working well for her.   [place] Office Visit from 12/25/2020 in [place]  PHQ-9 Total Score 1     6. Obstructive sleep apnea syndrome Does not use CPAP. She does not tolerate well.   7. Diverticulosis No flare ups recently.   8. Pure hypercholesterolemia See above.      Outpatient Encounter Medications as of 12/25/2020  Medication Sig  . aspirin 81 MG EC tablet Take 1 tablet (81 mg total) by mouth daily.  . benzonatate (TESSALON PERLES) 100 MG capsule Take 1 capsule (100 mg total) by mouth 3 (three) times daily as needed for cough.  . calcium elemental as carbonate (BARIATRIC TUMS ULTRA) 400 MG chewable tablet Chew 1,000 mg by mouth 3 (three) times daily.  . Cholecalciferol (VITAMIN D3) 5000 UNITS TABS Take 5,000 Units by mouth daily.  . citalopram (CELEXA) 40 MG tablet Take 1 tablet (40 mg total) by mouth daily.  [name] diltiazem (CARDIZEM CD) 240 MG 24 hr capsule Take 1 capsule (240 mg total) by mouth daily.  . fluticasone (FLONASE) 50 MCG/ACT nasal spray SPRAY 2 SPRAYS INTO EACH NOSTRIL EVERY DAY  . gabapentin (NEURONTIN) 400 MG capsule TAKE 1 TO 3 CAPSULES BY MOUTH AT BEDTIME  . glucose blood test strip Use as instructed  . insulin NPH Human (HUMULIN N) 100 UNIT/ML injection INJECT 0.4 MLS (40 UNITS TOTAL) INTO THE SKIN 2 (TWO) TIMES DAILY.  [name] insulin regular (HUMULIN R) 100 units/mL injection INJECT 10 TO 20 UNITS SUBCUTANEOUSLY THREE TIMES DAILY BEFORE MEAL(S)  . Insulin Syringe-Needle U-100 (BD INSULIN SYRINGE U/F) 30G X 1/2"  0.5 ML MISC Inject into skin 5 times daily Dx E10.65  . lisinopril-hydrochlorothiazide (ZESTORETIC) 20-25 MG tablet Take 1 tablet by mouth daily.  . metFORMIN (GLUCOPHAGE) 1000 MG tablet Take 1 tablet (1,000 mg total) by mouth 2 (two) times daily with a meal.  . ONETOUCH ULTRA test strip TEST TWICE DAILY E11.9  . simvastatin (ZOCOR) 40 MG tablet Take 1 tablet (40 mg total) by mouth daily.   No facility-administered encounter medications on file as of 12/25/2020.    Past Surgical History:  Procedure Laterality Date  . APPENDECTOMY    . BREAST CYST EXCISION     benign  . CARPAL TUNNEL RELEASE    . CHOLECYSTECTOMY    . COLONOSCOPY  02/13/2012  . EXAMINATION UNDER ANESTHESIA     and sphincterotomy  . HAMMER TOE SURGERY      bilateral; 5th toes  . LEFT HEART CATH  02/28/2012   Procedure: LEFT HEART CATH;  Surgeon: Hillary Bow, MD;  Location[facility] C[facility] LAB;  Service: Cardiovascular;;  . LEFT HEART CATHETERIZATION WITH CORONARY ANGIOGRAM N/A 02/28/2012   Procedure: LEFT HEART CATHETERIZATION WITH CORONARY ANGIOGRAM;  Surgeon: Hillary Bow, MD;  Location: [facility] C[facility] LAB;  Service: Cardiovascular;  Laterality: N/A;  . VAGINAL HYSTERECTOMY  1978    Family History  Problem Relation Age of Onset  . Heart failure Mother        CHF   . COPD Father   . Cancer Brother        prostate  . Kidney Stones Brother   . Epilepsy Daughter   . Kidney Stones Daughter   . Colon cancer Neg Hx   . Stomach cancer Neg Hx     New complaints: Right breast tenderness with right flank pain. She does not think they are related or muscle pain.   Social history: Lives with daughter and great grandson  Controlled substance contract: N/A  Review of Systems  Constitutional: Negative for chills and fever.  Respiratory: Positive for cough. Negative for shortness of breath and wheezing.        Post covid cough, but improving.   Cardiovascular: Negative for chest pain, palpitations and leg swelling.  Gastrointestinal: Negative for abdominal pain, constipation and diarrhea.  Genitourinary: Positive for flank pain. Negative for difficulty urinating, dysuria, frequency and hematuria.  Neurological: Negative for dizziness, syncope and light-headedness.  Psychiatric/Behavioral: Negative for confusion. The patient is not nervous/anxious.         Objective:   Physical Exam Constitutional:      Appearance: She is obese.  Cardiovascular:     Rate and Rhythm: Normal rate and regular rhythm.     Pulses: Normal pulses.     Heart sounds: Murmur (2/6 heard loudest at aortic valve) heard.    Pulmonary:     Effort: Pulmonary effort is normal.     Breath sounds: Wheezing present.     Comments: Wheezing noted in right lung, cleared with  cough. Abdominal:     General: There is no distension.     Palpations: There is no mass.     Tenderness: There is right CVA tenderness.  Musculoskeletal:        General: Normal range of motion.     Cervical back: Normal range of motion and neck supple.  Skin:    General: Skin is warm and dry.     Capillary Refill: Capillary refill takes less than 2 seconds.  Neurological:     Mental Status: She is alert and  oriented to person, place, and time.  Psychiatric:        Mood and Affect: Mood normal.        Behavior: Behavior normal.   BP 131/67   Pulse 80   Temp 97.8 F (36.6 C) (Temporal)   Resp 20   Ht $R'5\' 6"'gx$  (1.676 m)   Wt 220 lb (99.8 kg)   SpO2 97%   BMI 35.51 kg/m   hgba1c 7.5%  Assessment & Plan:  Adrienne Cook comes in today with chief complaint of Medical Management of Chronic Issues   Diagnosis and orders addressed:  1. Primary hypertension Continue medications as prescribed. Encouraged low sodium diet, exercise.  - CBC with Differential/Platelet - CMP14+EGFR - diltiazem (CARDIZEM CD) 240 MG 24 hr capsule; Take 1 capsule (240 mg total) by mouth daily.  Dispense: 90 capsule; Refill: 1 - lisinopril-hydrochlorothiazide (ZESTORETIC) 20-25 MG tablet; Take 1 tablet by mouth daily.  Dispense: 90 tablet; Refill: 1  2. Type 2 diabetes mellitus with neuropathy, with long-term current use of insulin (HCC) No changes today. Continue frequent blood sugar monitoring, low carb diet.  - Bayer DCA Hb A1c Waived - Microalbumin / creatinine urine ratio - metFORMIN (GLUCOPHAGE) 1000 MG tablet; Take 1 tablet (1,000 mg total) by mouth 2 (two) times daily with a meal.  Dispense: 180 tablet; Refill: 1 - insulin regular (HUMULIN R) 100 units/mL injection; INJECT 10 TO 20 UNITS SUBCUTANEOUSLY THREE TIMES DAILY BEFORE MEAL(S)  Dispense: 30 mL; Refill: 5 - insulin NPH Human (HUMULIN N) 100 UNIT/ML injection; INJECT 0.4 MLS (40 UNITS TOTAL) INTO THE SKIN 2 (TWO) TIMES DAILY.  Dispense: 70 mL;  Refill: 5 - gabapentin (NEURONTIN) 400 MG capsule; TAKE 1 TO 3 CAPSULES BY MOUTH AT BEDTIME  Dispense: 270 capsule; Refill: 1  3. Mixed hyperlipidemia Continue medication as prescribed. Encouraged low fat diet with exercise.  - simvastatin (ZOCOR) 40 MG tablet; Take 1 tablet (40 mg total) by mouth daily.  Dispense: 90 tablet; Refill: 1 - Lipid panel  4.  Depression, major, single episode, mild ([place]) Continue medication as prescribed. She is doing well on this.   - citalopram (CELEXA) 40 MG tablet; Take 1 tablet (40 mg total) by mouth daily.  Dispense: 90 tablet; Refill: 1  5. Obstructive sleep apnea syndrome No changes.   6. Diverticulosis No acute issues at this time.   7. Acute right-sided low back pain without sciatica UA negative. She can take PRN ibuprofen for pain control.  - Urinalysis, Complete    Labs pending Health Maintenance reviewed Diet and exercise encouraged  Follow up plan: 3 months  Dollene Primrose, RN, BSN, FNP-Student  Mary-Margaret Hassell Done, FNP

## 2020-12-26 LAB — CBC WITH DIFFERENTIAL/PLATELET
Basophils Absolute: 0.1 10*3/uL (ref 0.0–0.2)
Basos: 1 %
EOS (ABSOLUTE): 0.2 10*3/uL (ref 0.0–0.4)
Eos: 2 %
Hematocrit: 40.3 % (ref 34.0–46.6)
Hemoglobin: 12.9 g/dL (ref 11.1–15.9)
Immature Grans (Abs): 0 10*3/uL (ref 0.0–0.1)
Immature Granulocytes: 0 %
Lymphocytes Absolute: 2.8 10*3/uL (ref 0.7–3.1)
Lymphs: 25 %
MCH: 28.4 pg (ref 26.6–33.0)
MCHC: 32 g/dL (ref 31.5–35.7)
MCV: 89 fL (ref 79–97)
Monocytes Absolute: 0.8 10*3/uL (ref 0.1–0.9)
Monocytes: 7 %
Neutrophils Absolute: 7 10*3/uL (ref 1.4–7.0)
Neutrophils: 65 %
Platelets: 416 10*3/uL (ref 150–450)
RBC: 4.54 x10E6/uL (ref 3.77–5.28)
RDW: 13.7 % (ref 11.7–15.4)
WBC: 10.9 10*3/uL — ABNORMAL HIGH (ref 3.4–10.8)

## 2020-12-26 LAB — LIPID PANEL
Chol/HDL Ratio: 2.9 ratio (ref 0.0–4.4)
Cholesterol, Total: 149 mg/dL (ref 100–199)
HDL: 52 mg/dL (ref >39)
LDL Chol Calc (NIH): 73 mg/dL (ref 0–99)
Triglycerides: 135 mg/dL (ref 0–149)
VLDL Cholesterol Cal: 24 mg/dL (ref 5–40)

## 2020-12-26 LAB — MICROALBUMIN / CREATININE URINE RATIO
Creatinine, Urine: 329 mg/dL
Microalb/Creat Ratio: 15 mg/g creat (ref 0–29)
Microalbumin, Urine: 49.5 ug/mL

## 2020-12-26 LAB — CMP14+EGFR
ALT: 16 IU/L (ref 0–32)
AST: 17 IU/L (ref 0–40)
Albumin/Globulin Ratio: 1.7 (ref 1.2–2.2)
Albumin: 4.3 g/dL (ref 3.7–4.7)
Alkaline Phosphatase: 82 IU/L (ref 44–121)
BUN/Creatinine Ratio: 19 (ref 12–28)
BUN: 22 mg/dL (ref 8–27)
Bilirubin Total: 0.3 mg/dL (ref 0.0–1.2)
CO2: 19 mmol/L — ABNORMAL LOW (ref 20–29)
Calcium: 9.5 mg/dL (ref 8.7–10.3)
Chloride: 101 mmol/L (ref 96–106)
Creatinine, Ser: 1.16 mg/dL — ABNORMAL HIGH (ref 0.57–1.00)
Globulin, Total: 2.6 g/dL (ref 1.5–4.5)
Glucose: 143 mg/dL — ABNORMAL HIGH (ref 65–99)
Potassium: 4.4 mmol/L (ref 3.5–5.2)
Sodium: 139 mmol/L (ref 134–144)
Total Protein: 6.9 g/dL (ref 6.0–8.5)
eGFR: 48 mL/min/{1.73_m2} — ABNORMAL LOW (ref >59)

## 2021-03-04 ENCOUNTER — Other Ambulatory Visit: Payer: Self-pay | Admitting: Nurse Practitioner

## 2021-03-04 DIAGNOSIS — J301 Allergic rhinitis due to pollen: Secondary | ICD-10-CM

## 2021-03-09 ENCOUNTER — Other Ambulatory Visit: Payer: Self-pay

## 2021-03-09 MED ORDER — ONETOUCH ULTRA VI STRP: ORAL_STRIP | 3 refills | Status: DC

## 2021-03-20 ENCOUNTER — Other Ambulatory Visit: Payer: Self-pay | Admitting: Nurse Practitioner

## 2021-03-20 DIAGNOSIS — J301 Allergic rhinitis due to pollen: Secondary | ICD-10-CM

## 2021-03-26 ENCOUNTER — Other Ambulatory Visit: Payer: Self-pay

## 2021-03-26 ENCOUNTER — Ambulatory Visit (INDEPENDENT_AMBULATORY_CARE_PROVIDER_SITE_OTHER): Payer: Medicare Other | Admitting: Nurse Practitioner

## 2021-03-26 ENCOUNTER — Encounter: Payer: Self-pay | Admitting: Nurse Practitioner

## 2021-03-26 VITALS — BP 124/67 | HR 84 | Temp 97.4°F | Resp 20 | Ht 66.0 in | Wt 219.0 lb

## 2021-03-26 DIAGNOSIS — Z794 Long term (current) use of insulin: Secondary | ICD-10-CM

## 2021-03-26 DIAGNOSIS — F320 Major depressive disorder, single episode, mild: Secondary | ICD-10-CM

## 2021-03-26 DIAGNOSIS — E782 Mixed hyperlipidemia: Secondary | ICD-10-CM

## 2021-03-26 DIAGNOSIS — Z13820 Encounter for screening for osteoporosis: Secondary | ICD-10-CM

## 2021-03-26 DIAGNOSIS — E1142 Type 2 diabetes mellitus with diabetic polyneuropathy: Secondary | ICD-10-CM

## 2021-03-26 DIAGNOSIS — I10 Essential (primary) hypertension: Secondary | ICD-10-CM

## 2021-03-26 LAB — CMP14+EGFR
ALT: 15 IU/L (ref 0–32)
AST: 17 IU/L (ref 0–40)
Albumin/Globulin Ratio: 1.7 (ref 1.2–2.2)
Albumin: 4.3 g/dL (ref 3.7–4.7)
Alkaline Phosphatase: 74 IU/L (ref 44–121)
BUN/Creatinine Ratio: 17 (ref 12–28)
BUN: 19 mg/dL (ref 8–27)
Bilirubin Total: 0.3 mg/dL (ref 0.0–1.2)
CO2: 21 mmol/L (ref 20–29)
Calcium: 9.8 mg/dL (ref 8.7–10.3)
Chloride: 101 mmol/L (ref 96–106)
Creatinine, Ser: 1.12 mg/dL — ABNORMAL HIGH (ref 0.57–1.00)
Globulin, Total: 2.6 g/dL (ref 1.5–4.5)
Glucose: 220 mg/dL — ABNORMAL HIGH (ref 65–99)
Potassium: 4.9 mmol/L (ref 3.5–5.2)
Sodium: 139 mmol/L (ref 134–144)
Total Protein: 6.9 g/dL (ref 6.0–8.5)
eGFR: 50 mL/min/{1.73_m2} — ABNORMAL LOW (ref >59)

## 2021-03-26 LAB — CBC WITH DIFFERENTIAL/PLATELET
Basophils Absolute: 0.1 10*3/uL (ref 0.0–0.2)
Basos: 1 %
EOS (ABSOLUTE): 0.2 10*3/uL (ref 0.0–0.4)
Eos: 2 %
Hematocrit: 40.3 % (ref 34.0–46.6)
Hemoglobin: 12.8 g/dL (ref 11.1–15.9)
Immature Grans (Abs): 0 10*3/uL (ref 0.0–0.1)
Immature Granulocytes: 0 %
Lymphocytes Absolute: 2.7 10*3/uL (ref 0.7–3.1)
Lymphs: 28 %
MCH: 28.6 pg (ref 26.6–33.0)
MCHC: 31.8 g/dL (ref 31.5–35.7)
MCV: 90 fL (ref 79–97)
Monocytes Absolute: 0.7 10*3/uL (ref 0.1–0.9)
Monocytes: 8 %
Neutrophils Absolute: 5.9 10*3/uL (ref 1.4–7.0)
Neutrophils: 61 %
Platelets: 395 10*3/uL (ref 150–450)
RBC: 4.48 x10E6/uL (ref 3.77–5.28)
RDW: 13.3 % (ref 11.7–15.4)
WBC: 9.6 10*3/uL (ref 3.4–10.8)

## 2021-03-26 LAB — BAYER DCA HB A1C WAIVED: HB A1C (BAYER DCA - WAIVED): 7.2 % — ABNORMAL HIGH (ref <7.0)

## 2021-03-26 LAB — LIPID PANEL
Chol/HDL Ratio: 2.7 ratio (ref 0.0–4.4)
Cholesterol, Total: 137 mg/dL (ref 100–199)
HDL: 51 mg/dL (ref >39)
LDL Chol Calc (NIH): 63 mg/dL (ref 0–99)
Triglycerides: 129 mg/dL (ref 0–149)
VLDL Cholesterol Cal: 23 mg/dL (ref 5–40)

## 2021-03-26 MED ORDER — METFORMIN HCL 1000 MG PO TABS: 1000.0000 mg | ORAL_TABLET | Freq: Two times a day (BID) | ORAL | 1 refills | Status: DC

## 2021-03-26 MED ORDER — INSULIN NPH (HUMAN) (ISOPHANE) 100 UNIT/ML ~~LOC~~ SUSP: SUBCUTANEOUS | 5 refills | Status: DC

## 2021-03-26 MED ORDER — CITALOPRAM HYDROBROMIDE 40 MG PO TABS: 40.0000 mg | ORAL_TABLET | Freq: Every day | ORAL | 1 refills | Status: DC

## 2021-03-26 MED ORDER — LISINOPRIL-HYDROCHLOROTHIAZIDE 20-25 MG PO TABS: 1.0000 | ORAL_TABLET | Freq: Every day | ORAL | 1 refills | Status: DC

## 2021-03-26 MED ORDER — DILTIAZEM HCL ER COATED BEADS 240 MG PO CP24: 240.0000 mg | ORAL_CAPSULE | Freq: Every day | ORAL | 1 refills | Status: DC

## 2021-03-26 MED ORDER — SIMVASTATIN 40 MG PO TABS: 40.0000 mg | ORAL_TABLET | Freq: Every day | ORAL | 1 refills | Status: DC

## 2021-03-26 MED ORDER — INSULIN REGULAR HUMAN 100 UNIT/ML IJ SOLN: INTRAMUSCULAR | 5 refills | Status: DC

## 2021-03-26 MED ORDER — GABAPENTIN 400 MG PO CAPS: ORAL_CAPSULE | ORAL | 1 refills | Status: DC

## 2021-03-26 NOTE — Progress Notes (Signed)
Subjective:    Patient ID: Adrienne Cook, female    DOB: [birth year], 80 y.o.   MRN: [id number]   Chief Complaint: Medical Management of Chronic Issues    HPI:  1. Primary hypertension BP Readings from Last 3 Encounters:  03/26/21 124/67  12/25/20 131/67  11/10/20 132/66  Denies checking BP at home regularly. No chest pain, SOB, or headaches. Denies that she avoids salt. Takes medication as prescribed.   2. Type 2 diabetes mellitus with diabetic polyneuropathy, with long-term current use of insulin (HCC) Lab Results  Component Value Date   HGBA1C 7.5 (H) 12/25/2020  Today's is 7.2% today. Checks CBG at home BID. ~120-170 depending on what she has eaten. Takes medication as prescribed. Denies numbness or tingling in feet/hands.    3. Mixed hyperlipidemia Lab Results  Component Value Date   CHOL 149 12/25/2020   HDL 52 12/25/2020   LDLCALC 73 12/25/2020   TRIG 135 12/25/2020   CHOLHDL 2.9 12/25/2020   Takes medication as prescribed. Avoids fried or fatty foods. Cooks at home regularly.     Outpatient Encounter Medications as of 03/26/2021  Medication Sig   aspirin 81 MG EC tablet Take 1 tablet (81 mg total) by mouth daily.   calcium elemental as carbonate (BARIATRIC TUMS ULTRA) 400 MG chewable tablet Chew 1,000 mg by mouth 3 (three) times daily.   Cholecalciferol (VITAMIN D3) 5000 UNITS TABS Take 5,000 Units by mouth daily.   citalopram (CELEXA) 40 MG tablet Take 1 tablet (40 mg total) by mouth daily.   diltiazem (CARDIZEM CD) 240 MG 24 hr capsule Take 1 capsule (240 mg total) by mouth daily.   fluticasone (FLONASE) 50 MCG/ACT nasal spray SPRAY 2 SPRAYS INTO EACH NOSTRIL EVERY DAY   gabapentin (NEURONTIN) 400 MG capsule TAKE 1 TO 3 CAPSULES BY MOUTH AT BEDTIME   glucose blood (ONETOUCH ULTRA) test strip TEST TWICE DAILY E11.9   insulin NPH Human (HUMULIN N) 100 UNIT/ML injection INJECT 0.4 MLS (40 UNITS TOTAL) INTO THE SKIN 2 (TWO) TIMES DAILY.   insulin regular  (HUMULIN R) 100 units/mL injection INJECT 10 TO 20 UNITS SUBCUTANEOUSLY THREE TIMES DAILY BEFORE MEAL(S)   Insulin Syringe-Needle U-100 (BD INSULIN SYRINGE U/F) 30G X 1/2" 0.5 ML MISC Inject into skin 5 times daily Dx E10.65   lisinopril-hydrochlorothiazide (ZESTORETIC) 20-25 MG tablet Take 1 tablet by mouth daily.   loratadine (CLARITIN) 10 MG tablet TAKE 1 TABLET BY MOUTH EVERY DAY   metFORMIN (GLUCOPHAGE) 1000 MG tablet Take 1 tablet (1,000 mg total) by mouth 2 (two) times daily with a meal.   simvastatin (ZOCOR) 40 MG tablet Take 1 tablet (40 mg total) by mouth daily.   [DISCONTINUED] glucose blood test strip Use as instructed   No facility-administered encounter medications on file as of 03/26/2021.    Past Surgical History:  Procedure Laterality Date   APPENDECTOMY     BREAST CYST EXCISION     benign   CARPAL TUNNEL RELEASE     CHOLECYSTECTOMY     COLONOSCOPY  02/13/2012   EXAMINATION UNDER ANESTHESIA     and sphincterotomy   HAMMER TOE SURGERY     bilateral; 5th toes   LEFT HEART CATH  02/28/2012   Procedure: LEFT HEART CATH;  Surgeon: Herby Abraham, MD;  Location: [facility] CATH LAB;  Service: Cardiovascular;;   LEFT HEART CATHETERIZATION WITH CORONARY ANGIOGRAM N/A 02/28/2012   Procedure: LEFT HEART CATHETERIZATION WITH CORONARY ANGIOGRAM;  Surgeon: Herby Abraham, MD;  Location:  [facility] CATH LAB;  Service: Cardiovascular;  Laterality: N/A;   VAGINAL HYSTERECTOMY  1978    Family History  Problem Relation Age of Onset   Heart failure Mother        CHF    COPD Father    Cancer Brother        prostate   Kidney Stones Brother    Epilepsy Daughter    Kidney Stones Daughter    Colon cancer Neg Hx    Stomach cancer Neg Hx     New complaints: Bump on center forehead x 6-8 months, states it is getting larger. Does not cause her pain and is not tender to touch. Denies any drainage. Feels as if it is just a bone overgrowth. Denies injury.   Social history: Lives at home with her  daughter. Watches TV, goes out to eat with family. Has a dog. Makes wreaths.  Controlled substance contract: n/a     Review of Systems  Constitutional: Negative.   HENT: Negative.    Eyes: Negative.   Respiratory: Negative.    Cardiovascular: Negative.   Gastrointestinal: Negative.   Endocrine: Negative.   Genitourinary: Negative.   Musculoskeletal: Negative.   Skin: Negative.        Penny sized growth under skin on central forehead.   Allergic/Immunologic: Negative.   Neurological: Negative.   Hematological: Negative.   Psychiatric/Behavioral: Negative.    All other systems reviewed and are negative.     Objective:   Physical Exam Vitals and nursing note reviewed.  Constitutional:      Appearance: Normal appearance.  HENT:     Head: Normocephalic and atraumatic.     Right Ear: Tympanic membrane normal.     Left Ear: Tympanic membrane normal.     Nose: Nose normal.     Mouth/Throat:     Mouth: Mucous membranes are moist.     Pharynx: Oropharynx is clear.  Eyes:     Pupils: Pupils are equal, round, and reactive to light.  Cardiovascular:     Rate and Rhythm: Normal rate and regular rhythm.     Pulses: Normal pulses.     Heart sounds: Normal heart sounds.  Pulmonary:     Effort: Pulmonary effort is normal.     Breath sounds: Normal breath sounds.  Abdominal:     General: Abdomen is flat.     Palpations: Abdomen is soft.  Musculoskeletal:        General: Normal range of motion.     Cervical back: Normal range of motion.  Skin:    General: Skin is warm and dry.     Capillary Refill: Capillary refill takes less than 2 seconds.     Findings: Lesion present.     Comments: Non-mobile penny sized macule on central forehead, no drainage or head noted, not tender or erythematic.   Neurological:     General: No focal deficit present.     Mental Status: She is alert and oriented to person, place, and time. Mental status is at baseline.  Psychiatric:        Mood and  Affect: Mood normal.        Behavior: Behavior normal.        Thought Content: Thought content normal.        Judgment: Judgment normal.   BP 124/67   Pulse 84   Temp (!) 97.4 F (36.3 C) (Temporal)   Resp 20   Ht $R'5\' 6"'gq$  (1.676 m)   Wt 219  lb (99.3 kg)   SpO2 99%   BMI 35.35 kg/m   HGBa1c 7.2    Assessment & Plan:   JOSI ROEDIGER comes in today with chief complaint of Medical Management of Chronic Issues   Diagnosis and orders addressed:  1. Primary hypertension Take medication as prescribed. Eat a heart healthy diet and avoid foods that are high in salt. Check blood pressure regularly at home.  - CBC with Differential/Platelet - CMP14+EGFR  2. Type 2 diabetes mellitus with diabetic polyneuropathy, with long-term current use of insulin (HCC) Check blood sugar at home regularly. Take medication as prescribed. Avoid foods that are high in carb and sugar content.   - Bayer DCA Hb A1c Waived  3. Mixed hyperlipidemia Take medication as prescribed. Avoid fried and fatty foods. Exercise regularly. Walking is a great cardiovascular exercise that helps lower blood pressure, blood glucose levels, and cholesterol levels.    - Lipid panel  Meds ordered this encounter  Medications   citalopram (CELEXA) 40 MG tablet    Sig: Take 1 tablet (40 mg total) by mouth daily.    Dispense:  90 tablet    Refill:  1    Order Specific Question:   Supervising Provider    Answer:   Caryl Pina A [[id number]]   simvastatin (ZOCOR) 40 MG tablet    Sig: Take 1 tablet (40 mg total) by mouth daily.    Dispense:  90 tablet    Refill:  1    Order Specific Question:   Supervising Provider    Answer:   Caryl Pina A [[id number]]   diltiazem (CARDIZEM CD) 240 MG 24 hr capsule    Sig: Take 1 capsule (240 mg total) by mouth daily.    Dispense:  90 capsule    Refill:  1    Order Specific Question:   Supervising Provider    Answer:   Caryl Pina A [[id number]]    lisinopril-hydrochlorothiazide (ZESTORETIC) 20-25 MG tablet    Sig: Take 1 tablet by mouth daily.    Dispense:  90 tablet    Refill:  1    Order Specific Question:   Supervising Provider    Answer:   Caryl Pina A [[id number]]   metFORMIN (GLUCOPHAGE) 1000 MG tablet    Sig: Take 1 tablet (1,000 mg total) by mouth 2 (two) times daily with a meal.    Dispense:  180 tablet    Refill:  1    Order Specific Question:   Supervising Provider    Answer:   Caryl Pina A [[id number]]   insulin regular (HUMULIN R) 100 units/mL injection    Sig: INJECT 10 TO 20 UNITS SUBCUTANEOUSLY THREE TIMES DAILY BEFORE MEAL(S)    Dispense:  30 mL    Refill:  5    Order Specific Question:   Supervising Provider    Answer:   Caryl Pina A [[id number]]   insulin NPH Human (HUMULIN N) 100 UNIT/ML injection    Sig: INJECT 0.4 MLS (40 UNITS TOTAL) INTO THE SKIN 2 (TWO) TIMES DAILY.    Dispense:  70 mL    Refill:  5    Order Specific Question:   Supervising Provider    Answer:   Caryl Pina A [[id number]]   gabapentin (NEURONTIN) 400 MG capsule    Sig: TAKE 1 TO 3 CAPSULES BY MOUTH AT BEDTIME    Dispense:  270 capsule    Refill:  1    Order Specific Question:   Supervising Provider  Answer:   Worthy Rancher [[id number]]    Labs pending Health Maintenance reviewed Diet and exercise encouraged  Follow up plan: Follow up in 3 months.    Mary-Margaret Hassell Done, FNP

## 2021-03-26 NOTE — Patient Instructions (Signed)
https://www.nhlbi.nih.gov/files/docs/public/heart/dash_brief.pdf">  DASH Eating Plan DASH stands for Dietary Approaches to Stop Hypertension. The DASH eating plan is a healthy eating plan that has been shown to: Reduce high blood pressure (hypertension). Reduce your risk for type 2 diabetes, heart disease, and stroke. Help with weight loss. What are tips for following this plan? Reading food labels Check food labels for the amount of salt (sodium) per serving. Choose foods with less than 5 percent of the Daily Value of sodium. Generally, foods with less than 300 milligrams (mg) of sodium per serving fit into this eating plan. To find whole grains, look for the word "whole" as the first word in the ingredient list. Shopping Buy products labeled as "low-sodium" or "no salt added." Buy fresh foods. Avoid canned foods and pre-made or frozen meals. Cooking Avoid adding salt when cooking. Use salt-free seasonings or herbs instead of table salt or sea salt. Check with your health care provider or pharmacist before using salt substitutes. Do not fry foods. Cook foods using healthy methods such as baking, boiling, grilling, roasting, and broiling instead. Cook with heart-healthy oils, such as olive, canola, avocado, soybean, or sunflower oil. Meal planning  Eat a balanced diet that includes: 4 or more servings of fruits and 4 or more servings of vegetables each day. Try to fill one-half of your plate with fruits and vegetables. 6-8 servings of whole grains each day. Less than 6 oz (170 g) of lean meat, poultry, or fish each day. A 3-oz (85-g) serving of meat is about the same size as a deck of cards. One egg equals 1 oz (28 g). 2-3 servings of low-fat dairy each day. One serving is 1 cup (237 mL). 1 serving of nuts, seeds, or beans 5 times each week. 2-3 servings of heart-healthy fats. Healthy fats called omega-3 fatty acids are found in foods such as walnuts, flaxseeds, fortified milks, and eggs.  These fats are also found in cold-water fish, such as sardines, salmon, and mackerel. Limit how much you eat of: Canned or prepackaged foods. Food that is high in trans fat, such as some fried foods. Food that is high in saturated fat, such as fatty meat. Desserts and other sweets, sugary drinks, and other foods with added sugar. Full-fat dairy products. Do not salt foods before eating. Do not eat more than 4 egg yolks a week. Try to eat at least 2 vegetarian meals a week. Eat more home-cooked food and less restaurant, buffet, and fast food.  Lifestyle When eating at a restaurant, ask that your food be prepared with less salt or no salt, if possible. If you drink alcohol: Limit how much you use to: 0-1 drink a day for women who are not pregnant. 0-2 drinks a day for men. Be aware of how much alcohol is in your drink. In the [place] one drink equals one 12 oz bottle of beer (355 mL), one 5 oz glass of wine (148 mL), or one 1 oz glass of hard liquor (44 mL). General information Avoid eating more than 2,300 mg of salt a day. If you have hypertension, you may need to reduce your sodium intake to 1,500 mg a day. Work with your health care provider to maintain a healthy body weight or to lose weight. Ask what an ideal weight is for you. Get at least 30 minutes of exercise that causes your heart to beat faster (aerobic exercise) most days of the week. Activities may include walking, swimming, or biking. Work with your health care provider   or dietitian to adjust your eating plan to your individual calorie needs. What foods should I eat? Fruits All fresh, dried, or frozen fruit. Canned fruit in natural juice (without addedsugar). Vegetables Fresh or frozen vegetables (raw, steamed, roasted, or grilled). Low-sodium or reduced-sodium tomato and vegetable juice. Low-sodium or reduced-sodium tomatosauce and tomato paste. Low-sodium or reduced-sodium canned vegetables. Grains Whole-grain or  whole-wheat bread. Whole-grain or whole-wheat pasta. Brown rice. Oatmeal. Quinoa. Bulgur. Whole-grain and low-sodium cereals. Pita bread.Low-fat, low-sodium crackers. Whole-wheat flour tortillas. Meats and other proteins Skinless chicken or turkey. Ground chicken or turkey. Pork with fat trimmed off. Fish and seafood. Egg whites. Dried beans, peas, or lentils. Unsalted nuts, nut butters, and seeds. Unsalted canned beans. Lean cuts of beef with fat trimmed off. Low-sodium, lean precooked or cured meat, such as sausages or meatloaves. Dairy Low-fat (1%) or fat-free (skim) milk. Reduced-fat, low-fat, or fat-free cheeses. Nonfat, low-sodium ricotta or cottage cheese. Low-fat or nonfatyogurt. Low-fat, low-sodium cheese. Fats and oils Soft margarine without trans fats. Vegetable oil. Reduced-fat, low-fat, or light mayonnaise and salad dressings (reduced-sodium). Canola, safflower, olive, avocado, soybean, andsunflower oils. Avocado. Seasonings and condiments Herbs. Spices. Seasoning mixes without salt. Other foods Unsalted popcorn and pretzels. Fat-free sweets. The items listed above may not be a complete list of foods and beverages you can eat. Contact a dietitian for more information. What foods should I avoid? Fruits Canned fruit in a light or heavy syrup. Fried fruit. Fruit in cream or buttersauce. Vegetables Creamed or fried vegetables. Vegetables in a cheese sauce. Regular canned vegetables (not low-sodium or reduced-sodium). Regular canned tomato sauce and paste (not low-sodium or reduced-sodium). Regular tomato and vegetable juice(not low-sodium or reduced-sodium). Pickles. Olives. Grains Baked goods made with fat, such as croissants, muffins, or some breads. Drypasta or rice meal packs. Meats and other proteins Fatty cuts of meat. Ribs. Fried meat. Bacon. Bologna, salami, and other precooked or cured meats, such as sausages or meat loaves. Fat from the back of a pig (fatback). Bratwurst.  Salted nuts and seeds. Canned beans with added salt. Canned orsmoked fish. Whole eggs or egg yolks. Chicken or turkey with skin. Dairy Whole or 2% milk, cream, and half-and-half. Whole or full-fat cream cheese. Whole-fat or sweetened yogurt. Full-fat cheese. Nondairy creamers. Whippedtoppings. Processed cheese and cheese spreads. Fats and oils Butter. Stick margarine. Lard. Shortening. Ghee. Bacon fat. Tropical oils, suchas coconut, palm kernel, or palm oil. Seasonings and condiments Onion salt, garlic salt, seasoned salt, table salt, and sea salt. Worcestershire sauce. Tartar sauce. Barbecue sauce. Teriyaki sauce. Soy sauce, including reduced-sodium. Steak sauce. Canned and packaged gravies. Fish sauce. Oyster sauce. Cocktail sauce. Store-bought horseradish. Ketchup. Mustard. Meat flavorings and tenderizers. Bouillon cubes. Hot sauces. Pre-made or packaged marinades. Pre-made or packaged taco seasonings. Relishes. Regular saladdressings. Other foods Salted popcorn and pretzels. The items listed above may not be a complete list of foods and beverages you should avoid. Contact a dietitian for more information. Where to find more information National Heart, Lung, and Blood Institute: www.nhlbi.nih.gov American Heart Association: www.heart.org Academy of Nutrition and Dietetics: www.eatright.org National Kidney Foundation: www.kidney.org Summary The DASH eating plan is a healthy eating plan that has been shown to reduce high blood pressure (hypertension). It may also reduce your risk for type 2 diabetes, heart disease, and stroke. When on the DASH eating plan, aim to eat more fresh fruits and vegetables, whole grains, lean proteins, low-fat dairy, and heart-healthy fats. With the DASH eating plan, you should limit salt (sodium) intake to 2,300   mg a day. If you have hypertension, you may need to reduce your sodium intake to 1,500 mg a day. Work with your health care provider or dietitian to adjust  your eating plan to your individual calorie needs. This information is not intended to replace advice given to you by your health care provider. Make sure you discuss any questions you have with your healthcare provider. Document Revised: 08/06/2019 Document Reviewed: 08/06/2019 Elsevier Patient Education  2022 [employer] Inc.  

## 2021-03-26 NOTE — Addendum Note (Signed)
Addended by: Chevis Pretty on: 03/26/2021 09:29 AM   Modules accepted: Orders

## 2021-04-03 ENCOUNTER — Other Ambulatory Visit: Payer: Self-pay

## 2021-04-03 ENCOUNTER — Ambulatory Visit (INDEPENDENT_AMBULATORY_CARE_PROVIDER_SITE_OTHER): Payer: Medicare Other

## 2021-04-03 DIAGNOSIS — M85851 Other specified disorders of bone density and structure, right thigh: Secondary | ICD-10-CM | POA: Diagnosis not present

## 2021-04-03 DIAGNOSIS — Z780 Asymptomatic menopausal state: Secondary | ICD-10-CM

## 2021-04-06 DIAGNOSIS — Z1231 Encounter for screening mammogram for malignant neoplasm of breast: Secondary | ICD-10-CM | POA: Diagnosis not present

## 2021-07-06 ENCOUNTER — Ambulatory Visit (INDEPENDENT_AMBULATORY_CARE_PROVIDER_SITE_OTHER): Payer: Medicare Other | Admitting: Nurse Practitioner

## 2021-07-06 ENCOUNTER — Other Ambulatory Visit: Payer: Self-pay

## 2021-07-06 ENCOUNTER — Encounter: Payer: Self-pay | Admitting: Nurse Practitioner

## 2021-07-06 VITALS — BP 122/68 | HR 80 | Temp 98.1°F | Resp 20 | Ht 66.0 in | Wt 221.0 lb

## 2021-07-06 DIAGNOSIS — G4733 Obstructive sleep apnea (adult) (pediatric): Secondary | ICD-10-CM | POA: Diagnosis not present

## 2021-07-06 DIAGNOSIS — M25512 Pain in left shoulder: Secondary | ICD-10-CM

## 2021-07-06 DIAGNOSIS — K5790 Diverticulosis of intestine, part unspecified, without perforation or abscess without bleeding: Secondary | ICD-10-CM

## 2021-07-06 DIAGNOSIS — Z6841 Body Mass Index (BMI) 40.0 and over, adult: Secondary | ICD-10-CM

## 2021-07-06 DIAGNOSIS — I10 Essential (primary) hypertension: Secondary | ICD-10-CM | POA: Diagnosis not present

## 2021-07-06 DIAGNOSIS — N289 Disorder of kidney and ureter, unspecified: Secondary | ICD-10-CM

## 2021-07-06 DIAGNOSIS — E782 Mixed hyperlipidemia: Secondary | ICD-10-CM | POA: Diagnosis not present

## 2021-07-06 DIAGNOSIS — Z794 Long term (current) use of insulin: Secondary | ICD-10-CM

## 2021-07-06 DIAGNOSIS — E1169 Type 2 diabetes mellitus with other specified complication: Secondary | ICD-10-CM | POA: Insufficient documentation

## 2021-07-06 DIAGNOSIS — F320 Major depressive disorder, single episode, mild: Secondary | ICD-10-CM

## 2021-07-06 DIAGNOSIS — E1142 Type 2 diabetes mellitus with diabetic polyneuropathy: Secondary | ICD-10-CM | POA: Diagnosis not present

## 2021-07-06 DIAGNOSIS — Z23 Encounter for immunization: Secondary | ICD-10-CM | POA: Diagnosis not present

## 2021-07-06 LAB — CBC WITH DIFFERENTIAL/PLATELET
Basophils Absolute: 0.1 10*3/uL (ref 0.0–0.2)
Basos: 1 %
EOS (ABSOLUTE): 0.1 10*3/uL (ref 0.0–0.4)
Eos: 2 %
Hematocrit: 36.9 % (ref 34.0–46.6)
Hemoglobin: 12.2 g/dL (ref 11.1–15.9)
Immature Grans (Abs): 0 10*3/uL (ref 0.0–0.1)
Immature Granulocytes: 0 %
Lymphocytes Absolute: 2.4 10*3/uL (ref 0.7–3.1)
Lymphs: 27 %
MCH: 29.1 pg (ref 26.6–33.0)
MCHC: 33.1 g/dL (ref 31.5–35.7)
MCV: 88 fL (ref 79–97)
Monocytes Absolute: 0.7 10*3/uL (ref 0.1–0.9)
Monocytes: 8 %
Neutrophils Absolute: 5.5 10*3/uL (ref 1.4–7.0)
Neutrophils: 62 %
Platelets: 393 10*3/uL (ref 150–450)
RBC: 4.19 x10E6/uL (ref 3.77–5.28)
RDW: 13.8 % (ref 11.7–15.4)
WBC: 8.9 10*3/uL (ref 3.4–10.8)

## 2021-07-06 LAB — CMP14+EGFR
ALT: 16 IU/L (ref 0–32)
AST: 19 IU/L (ref 0–40)
Albumin/Globulin Ratio: 2.1 (ref 1.2–2.2)
Albumin: 4.7 g/dL (ref 3.7–4.7)
Alkaline Phosphatase: 71 IU/L (ref 44–121)
BUN/Creatinine Ratio: 17 (ref 12–28)
BUN: 18 mg/dL (ref 8–27)
Bilirubin Total: 0.3 mg/dL (ref 0.0–1.2)
CO2: 21 mmol/L (ref 20–29)
Calcium: 9.3 mg/dL (ref 8.7–10.3)
Chloride: 103 mmol/L (ref 96–106)
Creatinine, Ser: 1.03 mg/dL — ABNORMAL HIGH (ref 0.57–1.00)
Globulin, Total: 2.2 g/dL (ref 1.5–4.5)
Glucose: 191 mg/dL — ABNORMAL HIGH (ref 70–99)
Potassium: 5 mmol/L (ref 3.5–5.2)
Sodium: 137 mmol/L (ref 134–144)
Total Protein: 6.9 g/dL (ref 6.0–8.5)
eGFR: 56 mL/min/{1.73_m2} — ABNORMAL LOW (ref >59)

## 2021-07-06 LAB — LIPID PANEL
Chol/HDL Ratio: 3.1 ratio (ref 0.0–4.4)
Cholesterol, Total: 138 mg/dL (ref 100–199)
HDL: 44 mg/dL (ref >39)
LDL Chol Calc (NIH): 72 mg/dL (ref 0–99)
Triglycerides: 126 mg/dL (ref 0–149)
VLDL Cholesterol Cal: 22 mg/dL (ref 5–40)

## 2021-07-06 LAB — BAYER DCA HB A1C WAIVED: HB A1C (BAYER DCA - WAIVED): 7.5 % — ABNORMAL HIGH (ref 4.8–5.6)

## 2021-07-06 MED ORDER — CELECOXIB 200 MG PO CAPS: 200.0000 mg | ORAL_CAPSULE | Freq: Two times a day (BID) | ORAL | 1 refills | Status: DC

## 2021-07-06 NOTE — Progress Notes (Signed)
Subjective:    Patient ID: Adrienne Cook, female    DOB: 04-08-1941, 80 y.o.   MRN: [id number]  Chief Complaint: Medical Management of Chronic Issues    HPI:  1. Primary hypertension No c/o chest pain, sob or headache. Doe snot check blood pressure at home. BP Readings from Last 3 Encounters:  07/06/21 122/68  03/26/21 124/67  12/25/20 131/67     2. Type 2 diabetes mellitus with diabetic polyneuropathy, with long-term current use of insulin (HCC) Fasting blood sugars are running around between 140-170, especially the last week. Lab Results  Component Value Date   HGBA1C 7.2 (H) 03/26/2021     3. Mixed hyperlipidemia Doe snot watch diet very closely and does no dedicated exercise. Lab Results  Component Value Date   CHOL 137 03/26/2021   HDL 51 03/26/2021   LDLCALC 63 03/26/2021   TRIG 129 03/26/2021   CHOLHDL 2.7 03/26/2021     4.  Renal insufficiency No problems voiding Lab Results  Component Value Date   CREATININE 1.12 (H) 03/26/2021    5. Obstructive sleep apnea syndrome Does not use cpap. Said she could not sleep with it on her face.  6. Diverticulosis No recent flare ups  7  Depression, major, single episode, mild (HCC) Is on celexa daly and is doing well Depression screen [facility] 2/9 07/06/2021 03/26/2021 12/25/2020  Decreased Interest 0 0 0  Down, Depressed, Hopeless 0 1 0  PHQ - 2 Score 0 1 0  Altered sleeping 0 0 0  Tired, decreased energy 0 2 1  Change in appetite 0 0 0  Feeling bad or failure about yourself  0 0 0  Trouble concentrating 0 0 0  Moving slowly or fidgety/restless 0 0 0  Suicidal thoughts 0 0 0  PHQ-9 Score 0 3 1  Difficult doing work/chores Not difficult at all Not difficult at all Not difficult at all  Some recent data might be hidden     8. Class 3 severe obesity due to excess calories with serious comorbidity and body mass index (BMI) greater than or equal to 70 in adult [facility]) No recent weight changes Wt Readings from  Last 3 Encounters:  07/06/21 221 lb (100.2 kg)  03/26/21 219 lb (99.3 kg)  12/25/20 220 lb (99.8 kg)   BMI Readings from Last 3 Encounters:  07/06/21 35.67 kg/m  03/26/21 35.35 kg/m  12/25/20 35.51 kg/m       Outpatient Encounter Medications as of 07/06/2021  Medication Sig   aspirin 81 MG EC tablet Take 1 tablet (81 mg total) by mouth daily.   calcium elemental as carbonate (BARIATRIC TUMS ULTRA) 400 MG chewable tablet Chew 1,000 mg by mouth 3 (three) times daily.   Cholecalciferol (VITAMIN D3) 5000 UNITS TABS Take 5,000 Units by mouth daily.   citalopram (CELEXA) 40 MG tablet Take 1 tablet (40 mg total) by mouth daily.   diltiazem (CARDIZEM CD) 240 MG 24 hr capsule Take 1 capsule (240 mg total) by mouth daily.   fluticasone (FLONASE) 50 MCG/ACT nasal spray SPRAY 2 SPRAYS INTO EACH NOSTRIL EVERY DAY   gabapentin (NEURONTIN) 400 MG capsule TAKE 1 TO 3 CAPSULES BY MOUTH AT BEDTIME   glucose blood (ONETOUCH ULTRA) test strip TEST TWICE DAILY E11.9   insulin NPH Human (HUMULIN N) 100 UNIT/ML injection INJECT 0.4 MLS (40 UNITS TOTAL) INTO THE SKIN 2 (TWO) TIMES DAILY.   insulin regular (HUMULIN R) 100 units/mL injection INJECT 10 TO 20 UNITS SUBCUTANEOUSLY THREE  TIMES DAILY BEFORE MEAL(S)   Insulin Syringe-Needle U-100 (BD INSULIN SYRINGE U/F) 30G X 1/2" 0.5 ML MISC Inject into skin 5 times daily Dx E10.65   lisinopril-hydrochlorothiazide (ZESTORETIC) 20-25 MG tablet Take 1 tablet by mouth daily.   loratadine (CLARITIN) 10 MG tablet TAKE 1 TABLET BY MOUTH EVERY DAY   metFORMIN (GLUCOPHAGE) 1000 MG tablet Take 1 tablet (1,000 mg total) by mouth 2 (two) times daily with a meal.   simvastatin (ZOCOR) 40 MG tablet Take 1 tablet (40 mg total) by mouth daily.   No facility-administered encounter medications on file as of 07/06/2021.    Past Surgical History:  Procedure Laterality Date   APPENDECTOMY     BREAST CYST EXCISION     benign   CARPAL TUNNEL RELEASE     CHOLECYSTECTOMY      COLONOSCOPY  02/13/2012   EXAMINATION UNDER ANESTHESIA     and sphincterotomy   HAMMER TOE SURGERY     bilateral; 5th toes   LEFT HEART CATH  02/28/2012   Procedure: LEFT HEART CATH;  Surgeon: Hillary Bow, MD;  Location: [facility] CATH LAB;  Service: Cardiovascular;;   LEFT HEART CATHETERIZATION WITH CORONARY ANGIOGRAM N/A 02/28/2012   Procedure: LEFT HEART CATHETERIZATION WITH CORONARY ANGIOGRAM;  Surgeon: Hillary Bow, MD;  Location: [facility] CATH LAB;  Service: Cardiovascular;  Laterality: N/A;   VAGINAL HYSTERECTOMY  1978    Family History  Problem Relation Age of Onset   Heart failure Mother        CHF    COPD Father    Cancer Brother        prostate   Kidney Stones Brother    Epilepsy Daughter    Kidney Stones Daughter    Colon cancer Neg Hx    Stomach cancer Neg Hx     New complaints: Having left shoulder pain. Started over a month ago. Rates pain 8/10. Tylenol helps. Any movements makes it worse.  Social history: Her grandson lives with her  Controlled substance contract: n/a     Review of Systems  Constitutional:  Negative for diaphoresis.  Eyes:  Negative for pain.  Respiratory:  Negative for shortness of breath.   Cardiovascular:  Negative for chest pain, palpitations and leg swelling.  Gastrointestinal:  Negative for abdominal pain.  Endocrine: Negative for polydipsia.  Skin:  Negative for rash.  Neurological:  Negative for dizziness, weakness and headaches.  Hematological:  Does not bruise/bleed easily.  All other systems reviewed and are negative.     Objective:   Physical Exam Vitals and nursing note reviewed.  Constitutional:      General: She is not in acute distress.    Appearance: Normal appearance. She is well-developed.  HENT:     Head: Normocephalic.     Right Ear: Tympanic membrane normal.     Left Ear: Tympanic membrane normal.     Nose: Nose normal.     Mouth/Throat:     Mouth: Mucous membranes are moist.  Eyes:     Pupils: Pupils are  equal, round, and reactive to light.  Neck:     Vascular: No carotid bruit or JVD.  Cardiovascular:     Rate and Rhythm: Normal rate and regular rhythm.     Heart sounds: Normal heart sounds.  Pulmonary:     Effort: Pulmonary effort is normal. No respiratory distress.     Breath sounds: Normal breath sounds. No wheezing or rales.  Chest:     Chest wall: No  tenderness.  Abdominal:     General: Bowel sounds are normal. There is no distension or abdominal bruit.     Palpations: Abdomen is soft. There is no hepatomegaly, splenomegaly, mass or pulsatile mass.     Tenderness: There is no abdominal tenderness.  Musculoskeletal:     Cervical back: Normal range of motion and neck supple.     Comments: FROM of left shoulder with pain on internal rotation Grips equal bil  Lymphadenopathy:     Cervical: No cervical adenopathy.  Skin:    General: Skin is warm and dry.  Neurological:     Mental Status: She is alert and oriented to person, place, and time.     Deep Tendon Reflexes: Reflexes are normal and symmetric.  Psychiatric:        Behavior: Behavior normal.        Thought Content: Thought content normal.        Judgment: Judgment normal.    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 Primary hypertension Low sodium diet - CBC with Differential/Platelet - CMP14+EGFR  2. Type 2 diabetes mellitus with diabetic polyneuropathy, with long-term current use of insulin (HCC) Stricter carb counting - Bayer DCA Hb A1c Waived  3. Mixed hyperlipidemia Low fat diet - Lipid panel  4. Renal insufficiency Labs pending  5. Obstructive sleep apnea syndrome  6. Diverticulosis Watch diet to prevent flare up  7.  Depression, major, single episode, mild ([place]) Stress management  8. Class 3 severe obesity due to excess calories with serious comorbidity and body mass index (BMI) greater than or equal to 70 in adult [facility]) Discussed diet and exercise for person with BMI >25 Will recheck weight in 3-6 months  9. Acute pain of left shoulder Moist heat rest - celecoxib (CELEBREX) 200 MG capsule; Take 1 capsule (200 mg total) by mouth 2 (two) times daily.  Dispense: 60 capsule; Refill: 1   Labs pending Health Maintenance reviewed Diet and exercise encouraged  Follow up plan: 3 month   [place], FNP

## 2021-07-06 NOTE — Patient Instructions (Signed)
Carbohydrate Counting for Diabetes Mellitus, Adult Carbohydrate counting is a method of keeping track of how many carbohydrates you eat. Eating carbohydrates naturally increases the amount of sugar (glucose) in the blood. Counting how many carbohydrates you eat improves your blood glucose control, which helps you manage your diabetes. It is important to know how many carbohydrates you can safely have in each meal. This is different for every person. A dietitian can help you make a meal plan and calculate how many carbohydrates you should have at each meal and snack. What foods contain carbohydrates? Carbohydrates are found in the following foods: Grains, such as breads and cereals. Dried beans and soy products. Starchy vegetables, such as potatoes, peas, and corn. Fruit and fruit juices. Milk and yogurt. Sweets and snack foods, such as cake, cookies, candy, chips, and soft drinks. How do I count carbohydrates in foods? There are two ways to count carbohydrates in food. You can read food labels or learn standard serving sizes of foods. You can use either of the methods or a combination of both. Using the Nutrition Facts label The Nutrition Facts list is included on the labels of almost all packaged foods and beverages in the [place] It includes: The serving size. Information about nutrients in each serving, including the grams (g) of carbohydrate per serving. To use the Nutrition Facts: Decide how many servings you will have. Multiply the number of servings by the number of carbohydrates per serving. The resulting number is the total amount of carbohydrates that you will be having. Learning the standard serving sizes of foods When you eat carbohydrate foods that are not packaged or do not include Nutrition Facts on the label, you need to measure the servings in order to count the amount of carbohydrates. Measure the foods that you will eat with a food scale or measuring cup, if needed. Decide how  many standard-size servings you will eat. Multiply the number of servings by 15. For foods that contain carbohydrates, one serving equals 15 g of carbohydrates. For example, if you eat 2 cups or 10 oz (300 g) of strawberries, you will have eaten 2 servings and 30 g of carbohydrates (2 servings x 15 g = 30 g). For foods that have more than one food mixed, such as soups and casseroles, you must count the carbohydrates in each food that is included. The following list contains standard serving sizes of common carbohydrate-rich foods. Each of these servings has about 15 g of carbohydrates: 1 slice of bread. 1 six-inch (15 cm) tortilla. ? cup or 2 oz (53 g) cooked rice or pasta.  cup or 3 oz (85 g) cooked or canned, drained and rinsed beans or lentils.  cup or 3 oz (85 g) starchy vegetable, such as peas, corn, or squash.  cup or 4 oz (120 g) hot cereal.  cup or 3 oz (85 g) boiled or mashed potatoes, or  or 3 oz (85 g) of a large baked potato.  cup or 4 fl oz (118 mL) fruit juice. 1 cup or 8 fl oz (237 mL) milk. 1 small or 4 oz (106 g) apple.  or 2 oz (63 g) of a medium banana. 1 cup or 5 oz (150 g) strawberries. 3 cups or 1 oz (24 g) popped popcorn. What is an example of carbohydrate counting? To calculate the number of carbohydrates in this sample meal, follow the steps shown below. Sample meal 3 oz (85 g) chicken breast. ? cup or 4 oz (106 g) brown   rice.  cup or 3 oz (85 g) corn. 1 cup or 8 fl oz (237 mL) milk. 1 cup or 5 oz (150 g) strawberries with sugar-free whipped topping. Carbohydrate calculation Identify the foods that contain carbohydrates: Rice. Corn. Milk. Strawberries. Calculate how many servings you have of each food: 2 servings rice. 1 serving corn. 1 serving milk. 1 serving strawberries. Multiply each number of servings by 15 g: 2 servings rice x 15 g = 30 g. 1 serving corn x 15 g = 15 g. 1 serving milk x 15 g = 15 g. 1 serving strawberries x 15 g = 15  g. Add together all of the amounts to find the total grams of carbohydrates eaten: 30 g + 15 g + 15 g + 15 g = 75 g of carbohydrates total. What are tips for following this plan? Shopping Develop a meal plan and then make a shopping list. Buy fresh and frozen vegetables, fresh and frozen fruit, dairy, eggs, beans, lentils, and whole grains. Look at food labels. Choose foods that have more fiber and less sugar. Avoid processed foods and foods with added sugars. Meal planning Aim to have the same amount of carbohydrates at each meal and for each snack time. Plan to have regular, balanced meals and snacks. Where to find more information American Diabetes Association: www.diabetes.org Centers for Disease Control and Prevention: www.cdc.gov Summary Carbohydrate counting is a method of keeping track of how many carbohydrates you eat. Eating carbohydrates naturally increases the amount of sugar (glucose) in the blood. Counting how many carbohydrates you eat improves your blood glucose control, which helps you manage your diabetes. A dietitian can help you make a meal plan and calculate how many carbohydrates you should have at each meal and snack. This information is not intended to replace advice given to you by your health care provider. Make sure you discuss any questions you have with your health care provider. Document Revised: 09/02/2019 Document Reviewed: 09/03/2019 Elsevier Patient Education  2021 Elsevier Inc.  

## 2021-09-06 ENCOUNTER — Other Ambulatory Visit: Payer: Self-pay | Admitting: Nurse Practitioner

## 2021-09-06 DIAGNOSIS — M25512 Pain in left shoulder: Secondary | ICD-10-CM

## 2021-09-27 ENCOUNTER — Encounter: Payer: Self-pay | Admitting: Nurse Practitioner

## 2021-09-27 ENCOUNTER — Ambulatory Visit (INDEPENDENT_AMBULATORY_CARE_PROVIDER_SITE_OTHER): Payer: Medicare Other | Admitting: Nurse Practitioner

## 2021-09-27 VITALS — BP 133/61 | HR 80 | Temp 97.6°F | Resp 20 | Ht 66.0 in | Wt 215.0 lb

## 2021-09-27 DIAGNOSIS — N3281 Overactive bladder: Secondary | ICD-10-CM

## 2021-09-27 DIAGNOSIS — K5790 Diverticulosis of intestine, part unspecified, without perforation or abscess without bleeding: Secondary | ICD-10-CM

## 2021-09-27 DIAGNOSIS — E782 Mixed hyperlipidemia: Secondary | ICD-10-CM

## 2021-09-27 DIAGNOSIS — N289 Disorder of kidney and ureter, unspecified: Secondary | ICD-10-CM | POA: Diagnosis not present

## 2021-09-27 DIAGNOSIS — I10 Essential (primary) hypertension: Secondary | ICD-10-CM | POA: Diagnosis not present

## 2021-09-27 DIAGNOSIS — Z794 Long term (current) use of insulin: Secondary | ICD-10-CM

## 2021-09-27 DIAGNOSIS — F320 Major depressive disorder, single episode, mild: Secondary | ICD-10-CM

## 2021-09-27 DIAGNOSIS — G4733 Obstructive sleep apnea (adult) (pediatric): Secondary | ICD-10-CM

## 2021-09-27 DIAGNOSIS — E1142 Type 2 diabetes mellitus with diabetic polyneuropathy: Secondary | ICD-10-CM

## 2021-09-27 DIAGNOSIS — Z23 Encounter for immunization: Secondary | ICD-10-CM

## 2021-09-27 DIAGNOSIS — Z6841 Body Mass Index (BMI) 40.0 and over, adult: Secondary | ICD-10-CM

## 2021-09-27 LAB — BAYER DCA HB A1C WAIVED: HB A1C (BAYER DCA - WAIVED): 7.7 % — ABNORMAL HIGH (ref 4.8–5.6)

## 2021-09-27 MED ORDER — CITALOPRAM HYDROBROMIDE 40 MG PO TABS: 40.0000 mg | ORAL_TABLET | Freq: Every day | ORAL | 1 refills | Status: DC

## 2021-09-27 MED ORDER — METFORMIN HCL 1000 MG PO TABS: 1000.0000 mg | ORAL_TABLET | Freq: Two times a day (BID) | ORAL | 1 refills | Status: DC

## 2021-09-27 MED ORDER — GABAPENTIN 400 MG PO CAPS: ORAL_CAPSULE | ORAL | 1 refills | Status: DC

## 2021-09-27 MED ORDER — DILTIAZEM HCL ER COATED BEADS 240 MG PO CP24: 240.0000 mg | ORAL_CAPSULE | Freq: Every day | ORAL | 1 refills | Status: DC

## 2021-09-27 MED ORDER — INSULIN NPH (HUMAN) (ISOPHANE) 100 UNIT/ML ~~LOC~~ SUSP: SUBCUTANEOUS | 5 refills | Status: DC

## 2021-09-27 MED ORDER — LISINOPRIL-HYDROCHLOROTHIAZIDE 20-25 MG PO TABS: 1.0000 | ORAL_TABLET | Freq: Every day | ORAL | 1 refills | Status: DC

## 2021-09-27 MED ORDER — SIMVASTATIN 40 MG PO TABS: 40.0000 mg | ORAL_TABLET | Freq: Every day | ORAL | 1 refills | Status: DC

## 2021-09-27 MED ORDER — INSULIN REGULAR HUMAN 100 UNIT/ML IJ SOLN: INTRAMUSCULAR | 5 refills | Status: DC

## 2021-09-27 NOTE — Progress Notes (Signed)
Subjective:    Patient ID: ANNALY Cook, female    DOB: [birth year], 81 y.o.   MRN: [id number]  Chief Complaint: medical management of chronic issues     HPI:  Adrienne Cook is a 81 y.o. who identifies as a female who was assigned female at birth.   Social history: Lives with: daughter and grandson Work history: retired   Water engineer in today for follow up of the following chronic medical issues:  1. Primary hypertension No c/o chest pain, sob or headache. Does not check blood pressure at home. BP Readings from Last 3 Encounters:  07/06/21 122/68  03/26/21 124/67  12/25/20 131/67     2. Mixed hyperlipidemia Does not watch diet and does no dedicated exercise. Lab Results  Component Value Date   CHOL 138 07/06/2021   HDL 44 07/06/2021   LDLCALC 72 07/06/2021   TRIG 126 07/06/2021   CHOLHDL 3.1 07/06/2021   The 10-year ASCVD risk score (Arnett DK, et al., 2019) is: 49.4%   3. Type 2 diabetes mellitus with diabetic polyneuropathy, with long-term current use of insulin (HCC) Fasting blood sugars range from 120-170 in mornings. Denies any low blood sugars. Lab Results  Component Value Date   HGBA1C 7.5 (H) 07/06/2021     4. Renal insufficiency No problems voiding. Lab Results  Component Value Date   CREATININE 1.03 (H) 07/06/2021     5. OAB (overactive bladder) Is doing well without meds  6. Diverticulosis Denies any recent flare ups  7. Depression, major, single episode, mild (HCC) Is on celexa daily and is working well.  8. Obstructive sleep apnea syndrome Does not wear cpap. Says she cannot sleep with it. Tears her nerves up.  9. Class 3 severe obesity due to excess calories with serious comorbidity and body mass index (BMI) greater than or equal to 70 in adult [facility]) Weight is down 6lbs Wt Readings from Last 3 Encounters:  09/27/21 215 lb (97.5 kg)  07/06/21 221 lb (100.2 kg)  03/26/21 219 lb (99.3 kg)   BMI Readings from Last 3 Encounters:   09/27/21 34.70 kg/m  07/06/21 35.67 kg/m  03/26/21 35.35 kg/m      New complaints: Has constant perineal itching. She has tried vagisil with no relief  No Known Allergies Outpatient Encounter Medications as of 09/27/2021  Medication Sig   aspirin 81 MG EC tablet Take 1 tablet (81 mg total) by mouth daily.   calcium elemental as carbonate (BARIATRIC TUMS ULTRA) 400 MG chewable tablet Chew 1,000 mg by mouth 3 (three) times daily.   celecoxib (CELEBREX) 200 MG capsule TAKE 1 CAPSULE BY MOUTH TWICE A DAY   Cholecalciferol (VITAMIN D3) 5000 UNITS TABS Take 5,000 Units by mouth daily.   citalopram (CELEXA) 40 MG tablet Take 1 tablet (40 mg total) by mouth daily.   diltiazem (CARDIZEM CD) 240 MG 24 hr capsule Take 1 capsule (240 mg total) by mouth daily.   fluticasone (FLONASE) 50 MCG/ACT nasal spray SPRAY 2 SPRAYS INTO EACH NOSTRIL EVERY DAY   gabapentin (NEURONTIN) 400 MG capsule TAKE 1 TO 3 CAPSULES BY MOUTH AT BEDTIME   glucose blood (ONETOUCH ULTRA) test strip TEST TWICE DAILY E11.9   insulin NPH Human (HUMULIN N) 100 UNIT/ML injection INJECT 0.4 MLS (40 UNITS TOTAL) INTO THE SKIN 2 (TWO) TIMES DAILY.   insulin regular (HUMULIN R) 100 units/mL injection INJECT 10 TO 20 UNITS SUBCUTANEOUSLY THREE TIMES DAILY BEFORE MEAL(S)   Insulin Syringe-Needle U-100 (BD INSULIN SYRINGE U/F)  30G X 1/2" 0.5 ML MISC Inject into skin 5 times daily Dx E10.65   lisinopril-hydrochlorothiazide (ZESTORETIC) 20-25 MG tablet Take 1 tablet by mouth daily.   loratadine (CLARITIN) 10 MG tablet TAKE 1 TABLET BY MOUTH EVERY DAY   metFORMIN (GLUCOPHAGE) 1000 MG tablet Take 1 tablet (1,000 mg total) by mouth 2 (two) times daily with a meal.   simvastatin (ZOCOR) 40 MG tablet Take 1 tablet (40 mg total) by mouth daily.   No facility-administered encounter medications on file as of 09/27/2021.    Past Surgical History:  Procedure Laterality Date   APPENDECTOMY     BREAST CYST EXCISION     benign   CARPAL TUNNEL  RELEASE     CHOLECYSTECTOMY     COLONOSCOPY  02/13/2012   EXAMINATION UNDER ANESTHESIA     and sphincterotomy   HAMMER TOE SURGERY     bilateral; 5th toes   LEFT HEART CATH  02/28/2012   Procedure: LEFT HEART CATH;  Surgeon: Hillary Bow, MD;  Location[facility] CATH LAB;  Service: Cardiovascular;;   LEFT HEART CATHETERIZATION WITH CORONARY ANGIOGRAM N/A 02/28/2012   Procedure: LEFT HEART CATHETERIZATION WITH CORONARY ANGIOGRAM;  Surgeon: Hillary Bow, MD;  Location: [facility] CATH LAB;  Service: Cardiovascular;  Laterality: N/A;   VAGINAL HYSTERECTOMY  1978    Family History  Problem Relation Age of Onset   Heart failure Mother        CHF    COPD Father    Cancer Brother        prostate   Kidney Stones Brother    Epilepsy Daughter    Kidney Stones Daughter    Colon cancer Neg Hx    Stomach cancer Neg Hx       Controlled substance contract: n/a     Review of Systems  Constitutional:  Negative for diaphoresis.  Eyes:  Negative for pain.  Respiratory:  Negative for shortness of breath.   Cardiovascular:  Negative for chest pain, palpitations and leg swelling.  Gastrointestinal:  Negative for abdominal pain.  Endocrine: Negative for polydipsia.  Skin:  Negative for rash.  Neurological:  Negative for dizziness, weakness and headaches.  Hematological:  Does not bruise/bleed easily.  All other systems reviewed and are negative.     Objective:   Physical Exam Vitals and nursing note reviewed.  Constitutional:      General: She is not in acute distress.    Appearance: Normal appearance. She is well-developed.  HENT:     Head: Normocephalic.     Right Ear: Tympanic membrane normal.     Left Ear: Tympanic membrane normal.     Nose: Nose normal.     Mouth/Throat:     Mouth: Mucous membranes are moist.  Eyes:     Pupils: Pupils are equal, round, and reactive to light.  Neck:     Vascular: No carotid bruit or JVD.  Cardiovascular:     Rate and Rhythm: Normal rate and  regular rhythm.     Heart sounds: Normal heart sounds.  Pulmonary:     Effort: Pulmonary effort is normal. No respiratory distress.     Breath sounds: Normal breath sounds. No wheezing or rales.  Chest:     Chest wall: No tenderness.  Abdominal:     General: Bowel sounds are normal. There is no distension or abdominal bruit.     Palpations: Abdomen is soft. There is no hepatomegaly, splenomegaly, mass or pulsatile mass.     Tenderness:  There is no abdominal tenderness.  Musculoskeletal:        General: Normal range of motion.     Cervical back: Normal range of motion and neck supple.  Lymphadenopathy:     Cervical: No cervical adenopathy.  Skin:    General: Skin is warm and dry.  Neurological:     Mental Status: She is alert and oriented to person, place, and time.     Deep Tendon Reflexes: Reflexes are normal and symmetric.  Psychiatric:        Behavior: Behavior normal.        Thought Content: Thought content normal.        Judgment: Judgment normal.    BP 133/61    Pulse 80    Temp 97.6 F (36.4 C) (Temporal)    Resp 20    Ht $R'5\' 6"'MS$  (1.676 m)    Wt 215 lb (97.5 kg)    SpO2 96%    BMI 34.70 kg/m   Hgba1c 7.7     Assessment & Plan:   Adrienne Cook comes in today with chief complaint of Medical Management of Chronic Issues   Diagnosis and orders addressed:  1. Primary hypertension Low sodium diet - CBC with Differential/Platelet - CMP14+EGFR - diltiazem (CARDIZEM CD) 240 MG 24 hr capsule; Take 1 capsule (240 mg total) by mouth daily.  Dispense: 90 capsule; Refill: 1 - lisinopril-hydrochlorothiazide (ZESTORETIC) 20-25 MG tablet; Take 1 tablet by mouth daily.  Dispense: 90 tablet; Refill: 1  2. Mixed hyperlipidemia Low fat diet - Lipid panel - simvastatin (ZOCOR) 40 MG tablet; Take 1 tablet (40 mg total) by mouth daily.  Dispense: 90 tablet; Refill: 1  3. Type 2 diabetes mellitus with diabetic polyneuropathy, with long-term current use of insulin (HCC) Stricter  carb counting - Bayer DCA Hb A1c Waived - metFORMIN (GLUCOPHAGE) 1000 MG tablet; Take 1 tablet (1,000 mg total) by mouth 2 (two) times daily with a meal.  Dispense: 180 tablet; Refill: 1 - insulin regular (HUMULIN R) 100 units/mL injection; INJECT 10 TO 20 UNITS SUBCUTANEOUSLY THREE TIMES DAILY BEFORE MEAL(S)  Dispense: 30 mL; Refill: 5 - insulin NPH Human (HUMULIN N) 100 UNIT/ML injection; INJECT 0.4 MLS (40 UNITS TOTAL) INTO THE SKIN 2 (TWO) TIMES DAILY.  Dispense: 70 mL; Refill: 5 - gabapentin (NEURONTIN) 400 MG capsule; TAKE 1 TO 3 CAPSULES BY MOUTH AT BEDTIME  Dispense: 270 capsule; Refill: 1  4. Renal insufficiency Labs pending  5. OAB (overactive bladder)  6. Diverticulosis Watch diet to prevent flare ups  7. Depression, major, single episode, mild (HCC) Stress management - citalopram (CELEXA) 40 MG tablet; Take 1 tablet (40 mg total) by mouth daily.  Dispense: 90 tablet; Refill: 1  8. Obstructive sleep apnea syndrome Refuses to wear CPAP  9. Class 3 severe obesity due to excess calories with serious comorbidity and body mass index (BMI) greater than or equal to 70 in adult [facility]) Discussed diet and exercise for person with BMI >25 Will recheck weight in 3-6 months    Labs pending Health Maintenance reviewed Diet and exercise encouraged  Follow up plan: 3 months   Mary-Margaret Hassell Done, FNP

## 2021-09-27 NOTE — Addendum Note (Signed)
Addended by: Rolena Infante on: 09/27/2021 04:18 PM   Modules accepted: Orders

## 2021-09-27 NOTE — Patient Instructions (Signed)

## 2021-09-28 LAB — CBC WITH DIFFERENTIAL/PLATELET
Basophils Absolute: 0.1 10*3/uL (ref 0.0–0.2)
Basos: 1 %
EOS (ABSOLUTE): 0.1 10*3/uL (ref 0.0–0.4)
Eos: 1 %
Hematocrit: 37.9 % (ref 34.0–46.6)
Hemoglobin: 12.4 g/dL (ref 11.1–15.9)
Immature Grans (Abs): 0 10*3/uL (ref 0.0–0.1)
Immature Granulocytes: 0 %
Lymphocytes Absolute: 2.1 10*3/uL (ref 0.7–3.1)
Lymphs: 24 %
MCH: 29 pg (ref 26.6–33.0)
MCHC: 32.7 g/dL (ref 31.5–35.7)
MCV: 89 fL (ref 79–97)
Monocytes Absolute: 0.6 10*3/uL (ref 0.1–0.9)
Monocytes: 7 %
Neutrophils Absolute: 6 10*3/uL (ref 1.4–7.0)
Neutrophils: 67 %
Platelets: 347 10*3/uL (ref 150–450)
RBC: 4.28 x10E6/uL (ref 3.77–5.28)
RDW: 13.7 % (ref 11.7–15.4)
WBC: 8.9 10*3/uL (ref 3.4–10.8)

## 2021-09-28 LAB — CMP14+EGFR
ALT: 15 IU/L (ref 0–32)
AST: 19 IU/L (ref 0–40)
Albumin/Globulin Ratio: 2.1 (ref 1.2–2.2)
Albumin: 4.5 g/dL (ref 3.7–4.7)
Alkaline Phosphatase: 71 IU/L (ref 44–121)
BUN/Creatinine Ratio: 22 (ref 12–28)
BUN: 25 mg/dL (ref 8–27)
Bilirubin Total: 0.4 mg/dL (ref 0.0–1.2)
CO2: 21 mmol/L (ref 20–29)
Calcium: 9.3 mg/dL (ref 8.7–10.3)
Chloride: 103 mmol/L (ref 96–106)
Creatinine, Ser: 1.15 mg/dL — ABNORMAL HIGH (ref 0.57–1.00)
Globulin, Total: 2.1 g/dL (ref 1.5–4.5)
Glucose: 175 mg/dL — ABNORMAL HIGH (ref 70–99)
Potassium: 4.9 mmol/L (ref 3.5–5.2)
Sodium: 140 mmol/L (ref 134–144)
Total Protein: 6.6 g/dL (ref 6.0–8.5)
eGFR: 49 mL/min/{1.73_m2} — ABNORMAL LOW (ref >59)

## 2021-09-28 LAB — LIPID PANEL
Chol/HDL Ratio: 3 ratio (ref 0.0–4.4)
Cholesterol, Total: 130 mg/dL (ref 100–199)
HDL: 44 mg/dL (ref >39)
LDL Chol Calc (NIH): 61 mg/dL (ref 0–99)
Triglycerides: 141 mg/dL (ref 0–149)
VLDL Cholesterol Cal: 25 mg/dL (ref 5–40)

## 2021-10-03 ENCOUNTER — Other Ambulatory Visit: Payer: Self-pay | Admitting: Nurse Practitioner

## 2021-10-03 DIAGNOSIS — M25512 Pain in left shoulder: Secondary | ICD-10-CM

## 2021-10-04 ENCOUNTER — Telehealth: Payer: Self-pay | Admitting: Nurse Practitioner

## 2021-10-04 ENCOUNTER — Ambulatory Visit: Payer: Medicare Other | Admitting: Nurse Practitioner

## 2021-10-04 MED ORDER — CLOBETASOL PROPIONATE 0.05 % EX CREA: 1.0000 "application " | TOPICAL_CREAM | Freq: Two times a day (BID) | CUTANEOUS | 0 refills | Status: DC

## 2021-10-04 NOTE — Telephone Encounter (Signed)
Pt said the pharmacy never received the cream that MMM was going to call in. Please call back and advise.

## 2021-10-04 NOTE — Telephone Encounter (Signed)
Prescription for cream sent to the office

## 2021-11-04 ENCOUNTER — Other Ambulatory Visit: Payer: Self-pay | Admitting: Nurse Practitioner

## 2021-11-04 DIAGNOSIS — M25512 Pain in left shoulder: Secondary | ICD-10-CM

## 2021-11-04 DIAGNOSIS — I10 Essential (primary) hypertension: Secondary | ICD-10-CM

## 2021-11-04 DIAGNOSIS — J301 Allergic rhinitis due to pollen: Secondary | ICD-10-CM

## 2021-11-05 ENCOUNTER — Other Ambulatory Visit: Payer: Self-pay | Admitting: Nurse Practitioner

## 2021-11-05 DIAGNOSIS — E1142 Type 2 diabetes mellitus with diabetic polyneuropathy: Secondary | ICD-10-CM

## 2021-11-05 MED ORDER — CELECOXIB 200 MG PO CAPS: 200.0000 mg | ORAL_CAPSULE | Freq: Two times a day (BID) | ORAL | 0 refills | Status: DC

## 2021-11-05 NOTE — Addendum Note (Signed)
Addended by: Thana Ates on: 11/05/2021 11:46 AM   Modules accepted: Orders

## 2021-11-12 ENCOUNTER — Telehealth: Payer: Self-pay

## 2021-11-12 NOTE — Telephone Encounter (Signed)
Tried to call pt x 2 to do AWV. LM x 2 with no return call. Mjp,lpn

## 2021-11-16 ENCOUNTER — Ambulatory Visit (INDEPENDENT_AMBULATORY_CARE_PROVIDER_SITE_OTHER): Payer: Medicare Other

## 2021-11-16 VITALS — Wt 207.8 lb

## 2021-11-16 DIAGNOSIS — Z0000 Encounter for general adult medical examination without abnormal findings: Secondary | ICD-10-CM | POA: Diagnosis not present

## 2021-11-16 NOTE — Progress Notes (Signed)
Subjective:   Adrienne Cook is a 81 y.o. female who presents for Medicare Annual (Subsequent) preventive examination.  Virtual Visit via Telephone Note  I connected with  Adrienne Cook on 11/16/21 at  2:45 PM EST by telephone and verified that I am speaking with the correct person using two identifiers.  Location: Patient: Home Provider: [facility] Persons participating in the virtual visit: patient/Nurse Health Advisor   I discussed the limitations, risks, security and privacy concerns of performing an evaluation and management service by telephone and the availability of in person appointments. The patient expressed understanding and agreed to proceed.  Interactive audio and video telecommunications were attempted between this nurse and patient, however failed, due to patient having technical difficulties OR patient did not have access to video capability.  We continued and completed visit with audio only.  Some vital signs may be absent or patient reported.   Bonney Berres E Dorissa Stinnette, LPN   Review of Systems     Cardiac Risk Factors include: advanced age (>81men, >8 women);dyslipidemia;hypertension;diabetes mellitus;sedentary lifestyle;obesity (BMI >30kg/m2);Other (see comment), Risk factor comments: OSA not on CPAP     Objective:    Today's Vitals   11/16/21 1438  Weight: 207 lb 12.8 oz (94.3 kg)  PainSc: 7    Body mass index is 33.54 kg/m.  Advanced Directives 11/16/2021 11/10/2020 07/01/2019 12/25/2016 10/27/2014 02/28/2012  Does Patient Have a Medical Advance Directive? No No No No No Patient does not have advance directive  Would patient like information on creating a medical advance directive? No - Patient declined No - Patient declined No - Patient declined - No - patient declined information -  Pre-existing out of facility DNR order (yellow form or pink MOST form) - - - - - No    Current Medications (verified) Outpatient Encounter Medications as of 11/16/2021  Medication Sig    aspirin 81 MG EC tablet Take 1 tablet (81 mg total) by mouth daily.   calcium elemental as carbonate (BARIATRIC TUMS ULTRA) 400 MG chewable tablet Chew 1,000 mg by mouth 3 (three) times daily.   celecoxib (CELEBREX) 200 MG capsule Take 1 capsule (200 mg total) by mouth 2 (two) times daily.   Cholecalciferol (VITAMIN D3) 5000 UNITS TABS Take 5,000 Units by mouth daily.   citalopram (CELEXA) 40 MG tablet Take 1 tablet (40 mg total) by mouth daily.   clobetasol cream (TEMOVATE) 1.06 % Apply 1 application topically 2 (two) times daily.   diltiazem (CARDIZEM CD) 240 MG 24 hr capsule Take 1 capsule (240 mg total) by mouth daily.   fluticasone (FLONASE) 50 MCG/ACT nasal spray SPRAY 2 SPRAYS INTO EACH NOSTRIL EVERY DAY   gabapentin (NEURONTIN) 400 MG capsule TAKE 1 TO 3 CAPSULES BY MOUTH AT BEDTIME   glucose blood (ONETOUCH ULTRA) test strip TEST TWICE DAILY E11.9   insulin NPH Human (HUMULIN N) 100 UNIT/ML injection INJECT 0.4 MLS (40 UNITS TOTAL) INTO THE SKIN 2 (TWO) TIMES DAILY.   insulin regular (HUMULIN R) 100 units/mL injection INJECT 10 TO 20 UNITS SUBCUTANEOUSLY THREE TIMES DAILY BEFORE MEAL(S)   Insulin Syringe-Needle U-100 (BD INSULIN SYRINGE U/F) 30G X 1/2" 0.5 ML MISC Inject into skin 5 times daily Dx E10.65   lisinopril-hydrochlorothiazide (ZESTORETIC) 20-25 MG tablet TAKE 1 TABLET BY MOUTH EVERY DAY   metFORMIN (GLUCOPHAGE) 1000 MG tablet TAKE 1 TABLET (1,000 MG TOTAL) BY MOUTH 2 (TWO) TIMES DAILY WITH A MEAL.   simvastatin (ZOCOR) 40 MG tablet Take 1 tablet (40 mg total) by  mouth daily.   No facility-administered encounter medications on file as of 11/16/2021.    Allergies (verified) Patient has no known allergies.   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 normal event monitor   Obesity    PONV (postoperative nausea and vomiting)     Seasonal allergies    Sleep apnea    does not tolerate CPAP   Past Surgical History:  Procedure Laterality Date   APPENDECTOMY     BREAST CYST EXCISION     benign   CARPAL TUNNEL RELEASE     CHOLECYSTECTOMY     COLONOSCOPY  02/13/2012   EXAMINATION UNDER ANESTHESIA     and sphincterotomy   HAMMER TOE SURGERY     bilateral; 5th toes   LEFT HEART CATH  02/28/2012   Procedure: LEFT HEART CATH;  Surgeon: Hillary Bow, MD;  Location: [facility] C[facility] LAB;  Service: Cardiovascular;;   LEFT HEART CATHETERIZATION WITH CORONARY ANGIOGRAM N/A 02/28/2012   Procedure: LEFT HEART CATHETERIZATION WITH CORONARY ANGIOGRAM;  Surgeon: Hillary Bow, MD;  Location: 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  Tobacco Use   Smoking status: Never   Smokeless tobacco: Never   Tobacco comments:    tobacco use - no  Vaping Use   Vaping Use: Never used  Substance and Sexual Activity   Alcohol use: No   Drug use: No   Sexual activity: Not Currently    Birth control/protection: Surgical    Comment: hyst  Other Topics Concern   Not on file  Social History Narrative   Lives with daughter    Social Determinants of Health   Financial Resource Strain: Low Risk    Difficulty of Paying Living Expenses: Not very hard  Food Insecurity: No Food Insecurity   Worried About Charity fundraiser in the Last Year: Never true   Ran Out of  Food in the Last Year: Never true  Transportation Needs: No Transportation Needs   Lack of Transportation (Medical): No   Lack of Transportation (Non-Medical): No  Physical Activity: Insufficiently Active   Days of Exercise per Week: 7 days   Minutes of Exercise per Session: 20 min  Stress: No Stress Concern Present   Feeling of Stress : Only a little  Social Connections: Moderately Integrated   Frequency of Communication with Friends and Family: More than three times a week   Frequency of Social Gatherings with Friends and Family: More than three times a week   Attends Religious Services: More than 4 times per  year   Active Member of Clubs or Organizations: Yes   Attends Music therapist: More than 4 times per year   Marital Status: Separated    Tobacco Counseling Counseling given: Not Answered Tobacco comments: tobacco use - no   Clinical Intake:  Pre-visit preparation completed: Yes  Pain : 0-10 Pain Score: 7  Pain Type: Chronic pain Pain Location: Shoulder Pain Orientation: Right Pain Descriptors / Indicators: Aching, Sore, Sharp Pain Onset: 1 to 4 weeks ago Pain Frequency: Intermittent     BMI - recorded: 33.54 Nutritional Status: BMI > 30  Obese Nutritional Risks: None Diabetes: Yes CBG done?: No Did pt. bring in CBG monitor from home?: No  How often do you need to have someone help you when you read instructions, pamphlets, or other written materials from your doctor or pharmacy?: 1 - Never  Diabetic? Nutrition Risk Assessment:  Has the patient had any N/V/D within the last 2 months?  No  Does the patient have any non-healing wounds?  No  Has the patient had any unintentional weight loss or weight gain?  No   Diabetes:  Is the patient diabetic?  Yes  If diabetic, was a CBG obtained today?  No  Did the patient bring in their glucometer from home?  No  How often do you monitor your CBG's? BID - 130 this am per patient.   Financial Strains  and Diabetes Management:  Are you having any financial strains with the device, your supplies or your medication? No .  Does the patient want to be seen by Chronic Care Management for management of their diabetes?  No  Would the patient like to be referred to a Nutritionist or for Diabetic Management?  No   Diabetic Exams:  Diabetic Eye Exam: Completed 2020. Overdue for diabetic eye exam. Pt has been advised about the importance in completing this exam. She will make appt after her car is repaired.  Diabetic Foot Exam: Completed 04/03/2021. Pt has been advised about the importance in completing this exam. Pt is scheduled for diabetic foot exam on next appt.    Interpreter Needed?: No  Information entered by :: Jesilyn Easom, LPN   Activities of Daily Living In your present state of health, do you have any difficulty performing the following activities: 11/16/2021  Hearing? N  Vision? N  Difficulty concentrating or making decisions? N  Walking or climbing stairs? N  Dressing or bathing? N  Doing errands, shopping? N  Preparing Food and eating ? N  Using the Toilet? N  In the past six months, have you accidently leaked urine? Y  Comment wears pads for protection  Do you have problems with loss of bowel control? N  Managing your Medications? N  Managing your Finances? N  Housekeeping or managing your Housekeeping? N  Some recent data might be hidden    Patient Care Team: Chevis Pretty, FNP as PCP - General (Family Medicine)  Indicate any recent Medical Services you may have received from other than [facility] providers in the past year (date may be approximate).     Assessment:   This is a routine wellness examination for [place].  Hearing/Vision screen Hearing Screening - Comments:: Denies hearing difficulties   Vision Screening - Comments:: Wears rx glasses prn only - behind with routine eye exams with [facility] in [place] issues and exercise activities  discussed: Current Exercise Habits: Home exercise routine, Type of exercise: walking, Time (Minutes): 20, Frequency (Times/Week): 7,  Weekly Exercise (Minutes/Week): 140, Intensity: Mild, Exercise limited by: orthopedic condition(s)   Goals Addressed             This Visit's Progress    DIET - INCREASE WATER INTAKE   On track    Try to drink 6-8 glasses of water daily.     Exercise 150 min/wk Moderate Activity   Not on track      Depression Screen PHQ 2/9 Scores 11/16/2021 09/27/2021 07/06/2021 03/26/2021 12/25/2020 11/10/2020 09/21/2020  PHQ - 2 Score 0 0 0 1 0 0 0  PHQ- 9 Score 0 0 0 3 1 - 0    Fall Risk Fall Risk  11/16/2021 09/27/2021 07/06/2021 03/26/2021 12/25/2020  Falls in the past year? 0 0 0 0 0  Number falls in past yr: 0 - - - -  Injury with Fall? 0 - - - -  Comment - - - - -  Risk Factor Category  - - - - -  Risk for fall due to : Orthopedic patient;Medication side effect - - - -  Follow up Falls prevention discussed - - - -  Comment - - - - -    FALL RISK PREVENTION PERTAINING TO THE HOME:  Any stairs in or around the home? Yes  If so, are there any without handrails? No  Home free of loose throw rugs in walkways, pet beds, electrical cords, etc? Yes  Adequate lighting in your home to reduce risk of falls? Yes   ASSISTIVE DEVICES UTILIZED TO PREVENT FALLS:  Life alert? No  Use of a cane, walker or w/c? No  Grab bars in the bathroom? No  Shower chair or bench in shower? No  Elevated toilet seat or a handicapped toilet? No   TIMED UP AND GO:  Was the test performed? No . Telephonic visit   Cognitive Function: Normal cognitive status assessed by direct observation by this Nurse Health Advisor. No abnormalities found.   MMSE - Mini Mental State Exam 06/29/2018  Orientation to time 5  Orientation to Place 5  Registration 3  Attention/ Calculation 5  Recall 3  Language- name 2 objects 2  Language- repeat 1  Language- follow 3 step command 3  Language- read &  follow direction 1  Write a sentence 1  Copy design 1  Total score 30     6CIT Screen 11/16/2021 11/10/2020 07/01/2019  What Year? 0 points 0 points 0 points  What month? 0 points 0 points 0 points  What time? 0 points 0 points 0 points  Count back from 20 0 points 0 points 0 points  Months in reverse 0 points 0 points 0 points  Repeat phrase 0 points - 2 points  Total Score 0 - 2    Immunizations Immunization History  Administered Date(s) Administered   Fluad Quad(high Dose 65+) 06/15/2019, 07/06/2021   Influenza, High Dose Seasonal PF 06/29/2016, 07/07/2017, 06/29/2018   Influenza-Unspecified 07/10/2020, 07/10/2020   Moderna Sars-Covid-2 Vaccination 11/01/2019, 11/29/2019, 09/13/2020, 02/23/2021   Pneumococcal Conjugate-13 10/09/2015   Pneumococcal Polysaccharide-23 07/10/2012   Tdap 06/29/2018   Zoster Recombinat (Shingrix) 09/27/2021    TDAP status: Up to date  Flu Vaccine status: Up to date  Pneumococcal vaccine status: Up to date  Covid-19 vaccine status: Completed vaccines  Qualifies for Shingles Vaccine? Yes   Zostavax completed Yes   Shingrix Completed?: No.    Education has been provided regarding the importance of this vaccine. Patient has been advised to call insurance company to  determine out of pocket expense if they have not yet received this vaccine. Advised may also receive vaccine at local pharmacy or Health Dept. Verbalized acceptance and understanding.  Screening Tests Health Maintenance  Topic Date Due   OPHTHALMOLOGY EXAM  03/22/2020   COVID-19 Vaccine (5 - Booster for Moderna series) 04/20/2021   Hepatitis C Screening  03/26/2022 (Originally 09/29/1958)   Zoster Vaccines- Shingrix (2 of 2) 11/22/2021   HEMOGLOBIN A1C  03/27/2022   FOOT EXAM  09/27/2022   MAMMOGRAM  04/07/2023   TETANUS/TDAP  06/29/2028   Pneumonia Vaccine 21+ Years old  Completed   INFLUENZA VACCINE  Completed   DEXA SCAN  Completed   HPV VACCINES  Aged Out    Health  Maintenance  Health Maintenance Due  Topic Date Due   OPHTHALMOLOGY EXAM  03/22/2020   COVID-19 Vaccine (5 - Booster for Moderna series) 04/20/2021    Colorectal cancer screening: Type of screening: Colonoscopy. Completed 02/13/2012. Repeat every 10 years  Mammogram status: Completed 04/13/2021. Repeat every year  Bone Density status: Completed 04/03/2021. Results reflect: Bone density results: OSTEOPENIA. Repeat every 2 years.  Lung Cancer Screening: (Low Dose CT Chest recommended if Age 30-80 years, 30 pack-year currently smoking OR have quit w/in 15years.) does not qualify  Additional Screening:  Hepatitis C Screening: does not qualify  Vision Screening: Recommended annual ophthalmology exams for early detection of glaucoma and other disorders of the eye. Is the patient up to date with their annual eye exam?  No  Who is the provider or what is the name of the office in which the patient attends annual eye exams? [facility] If pt is not established with a provider, would they like to be referred to a provider to establish care? No .   Dental Screening: Recommended annual dental exams for proper oral hygiene  Community Resource Referral / Chronic Care Management: CRR required this visit?  No   CCM required this visit?  No      Plan:     I have personally reviewed and noted the following in the patients chart:   Medical and social history Use of alcohol, tobacco or illicit drugs  Current medications and supplements including opioid prescriptions.  Functional ability and status Nutritional status Physical activity Advanced directives List of other physicians Hospitalizations, surgeries, and ER visits in previous 12 months Vitals Screenings to include cognitive, depression, and falls Referrals and appointments  In addition, I have reviewed and discussed with patient certain preventive protocols, quality metrics, and best practice recommendations. A  written personalized care plan for preventive services as well as general preventive health recommendations were provided to patient.     Sandrea Hammond, LPN   11/23/5318   Nurse Notes: None

## 2021-11-16 NOTE — Patient Instructions (Signed)
Ms. Adrienne Cook , Thank you for taking time to come for your Medicare Wellness Visit. I appreciate your ongoing commitment to your health goals. Please review the following plan we discussed and let me know if I can assist you in the future.   Screening recommendations/referrals: Colonoscopy: Done 02/13/2012 - Repeat in 10 years - discuss with Shelah Lewandowsky due to your age Mammogram: Done 04/13/2021 - Repeat annually  Bone Density: Done 04/03/2021 - Repeat every 2 years Recommended yearly ophthalmology/optometry visit for glaucoma screening and checkup Recommended yearly dental visit for hygiene and checkup  Vaccinations: Influenza vaccine: Done 07/06/2021 - Repeat annually  Pneumococcal vaccine: Done 07/10/2012 & 10/09/2015 Tdap vaccine: Done 06/29/2018 - Repeat in 10 years Shingles vaccine: Done 09/27/2021 - due for second dose at next visit   Covid-19: Done 11/01/19, 11/29/19, 09/13/20, & 02/23/21  Advanced directives: Please bring a copy of your health care power of attorney and living will to the office to be added to your chart at your convenience.   Conditions/risks identified: Aim for 30 minutes of exercise or brisk walking, 6-8 glasses of water, and 5 servings of fruits and vegetables each day.   Next appointment: Follow up in one year for your annual wellness visit    Preventive Care 65 Years and Older, Female Preventive care refers to lifestyle choices and visits with your health care provider that can promote health and wellness. What does preventive care include? A yearly physical exam. This is also called an annual well check. Dental exams once or twice a year. Routine eye exams. Ask your health care provider how often you should have your eyes checked. Personal lifestyle choices, including: Daily care of your teeth and gums. Regular physical activity. Eating a healthy diet. Avoiding tobacco and drug use. Limiting alcohol use. Practicing safe sex. Taking low-dose aspirin every  day. Taking vitamin and mineral supplements as recommended by your health care provider. What happens during an annual well check? The services and screenings done by your health care provider during your annual well check will depend on your age, overall health, lifestyle risk factors, and family history of disease. Counseling  Your health care provider may ask you questions about your: Alcohol use. Tobacco use. Drug use. Emotional well-being. Home and relationship well-being. Sexual activity. Eating habits. History of falls. Memory and ability to understand (cognition). Work and work Statistician. Reproductive health. Screening  You may have the following tests or measurements: Height, weight, and BMI. Blood pressure. Lipid and cholesterol levels. These may be checked every 5 years, or more frequently if you are over 69 years old. Skin check. Lung cancer screening. You may have this screening every year starting at age 81 if you have a 30-pack-year history of smoking and currently smoke or have quit within the past 15 years. Fecal occult blood test (FOBT) of the stool. You may have this test every year starting at age 66. Flexible sigmoidoscopy or colonoscopy. You may have a sigmoidoscopy every 5 years or a colonoscopy every 10 years starting at age 30. Hepatitis C blood test. Hepatitis B blood test. Sexually transmitted disease (STD) testing. Diabetes screening. This is done by checking your blood sugar (glucose) after you have not eaten for a while (fasting). You may have this done every 1-3 years. Bone density scan. This is done to screen for osteoporosis. You may have this done starting at age 66. Mammogram. This may be done every 1-2 years. Talk to your health care provider about how often you should  have regular mammograms. Talk with your health care provider about your test results, treatment options, and if necessary, the need for more tests. Vaccines  Your health care  provider may recommend certain vaccines, such as: Influenza vaccine. This is recommended every year. Tetanus, diphtheria, and acellular pertussis (Tdap, Td) vaccine. You may need a Td booster every 10 years. Zoster vaccine. You may need this after age 35. Pneumococcal 13-valent conjugate (PCV13) vaccine. One dose is recommended after age 24. Pneumococcal polysaccharide (PPSV23) vaccine. One dose is recommended after age 62. Talk to your health care provider about which screenings and vaccines you need and how often you need them. This information is not intended to replace advice given to you by your health care provider. Make sure you discuss any questions you have with your health care provider. Document Released: 09/29/2015 Document Revised: 05/22/2016 Document Reviewed: 07/04/2015 Elsevier Interactive Patient Education  2017 [place] Prevention in the Home Falls can cause injuries. They can happen to people of all ages. There are many things you can do to make your home safe and to help prevent falls. What can I do on the outside of my home? Regularly fix the edges of walkways and driveways and fix any cracks. Remove anything that might make you trip as you walk through a door, such as a raised step or threshold. Trim any bushes or trees on the path to your home. Use bright outdoor lighting. Clear any walking paths of anything that might make someone trip, such as rocks or tools. Regularly check to see if handrails are loose or broken. Make sure that both sides of any steps have handrails. Any raised decks and porches should have guardrails on the edges. Have any leaves, snow, or ice cleared regularly. Use sand or salt on walking paths during winter. Clean up any spills in your garage right away. This includes oil or grease spills. What can I do in the bathroom? Use night lights. Install grab bars by the toilet and in the tub and shower. Do not use towel bars as grab  bars. Use non-skid mats or decals in the tub or shower. If you need to sit down in the shower, use a plastic, non-slip stool. Keep the floor dry. Clean up any water that spills on the floor as soon as it happens. Remove soap buildup in the tub or shower regularly. Attach bath mats securely with double-sided non-slip rug tape. Do not have throw rugs and other things on the floor that can make you trip. What can I do in the bedroom? Use night lights. Make sure that you have a light by your bed that is easy to reach. Do not use any sheets or blankets that are too big for your bed. They should not hang down onto the floor. Have a firm chair that has side arms. You can use this for support while you get dressed. Do not have throw rugs and other things on the floor that can make you trip. What can I do in the kitchen? Clean up any spills right away. Avoid walking on wet floors. Keep items that you use a lot in easy-to-reach places. If you need to reach something above you, use a strong step stool that has a grab bar. Keep electrical cords out of the way. Do not use floor polish or wax that makes floors slippery. If you must use wax, use non-skid floor wax. Do not have throw rugs and other things on the floor  that can make you trip. What can I do with my stairs? Do not leave any items on the stairs. Make sure that there are handrails on both sides of the stairs and use them. Fix handrails that are broken or loose. Make sure that handrails are as long as the stairways. Check any carpeting to make sure that it is firmly attached to the stairs. Fix any carpet that is loose or worn. Avoid having throw rugs at the top or bottom of the stairs. If you do have throw rugs, attach them to the floor with carpet tape. Make sure that you have a light switch at the top of the stairs and the bottom of the stairs. If you do not have them, ask someone to add them for you. What else can I do to help prevent  falls? Wear shoes that: Do not have high heels. Have rubber bottoms. Are comfortable and fit you well. Are closed at the toe. Do not wear sandals. If you use a stepladder: Make sure that it is fully opened. Do not climb a closed stepladder. Make sure that both sides of the stepladder are locked into place. Ask someone to hold it for you, if possible. Clearly mark and make sure that you can see: Any grab bars or handrails. First and last steps. Where the edge of each step is. Use tools that help you move around (mobility aids) if they are needed. These include: Canes. Walkers. Scooters. Crutches. Turn on the lights when you go into a dark area. Replace any light bulbs as soon as they burn out. Set up your furniture so you have a clear path. Avoid moving your furniture around. If any of your floors are uneven, fix them. If there are any pets around you, be aware of where they are. Review your medicines with your doctor. Some medicines can make you feel dizzy. This can increase your chance of falling. Ask your doctor what other things that you can do to help prevent falls. This information is not intended to replace advice given to you by your health care provider. Make sure you discuss any questions you have with your health care provider. Document Released: 06/29/2009 Document Revised: 02/08/2016 Document Reviewed: 10/07/2014 Elsevier Interactive Patient Education  2017 [employer].

## 2021-11-27 ENCOUNTER — Other Ambulatory Visit: Payer: Self-pay | Admitting: Nurse Practitioner

## 2021-11-27 DIAGNOSIS — E782 Mixed hyperlipidemia: Secondary | ICD-10-CM

## 2021-12-07 ENCOUNTER — Other Ambulatory Visit: Payer: Self-pay | Admitting: Nurse Practitioner

## 2021-12-07 DIAGNOSIS — J301 Allergic rhinitis due to pollen: Secondary | ICD-10-CM

## 2021-12-07 DIAGNOSIS — F320 Major depressive disorder, single episode, mild: Secondary | ICD-10-CM

## 2021-12-28 ENCOUNTER — Other Ambulatory Visit: Payer: Self-pay | Admitting: Nurse Practitioner

## 2021-12-28 DIAGNOSIS — J301 Allergic rhinitis due to pollen: Secondary | ICD-10-CM

## 2022-01-01 ENCOUNTER — Encounter: Payer: Self-pay | Admitting: Nurse Practitioner

## 2022-01-01 ENCOUNTER — Ambulatory Visit (INDEPENDENT_AMBULATORY_CARE_PROVIDER_SITE_OTHER): Payer: Medicare Other | Admitting: Nurse Practitioner

## 2022-01-01 VITALS — BP 119/56 | HR 52 | Temp 97.6°F | Resp 20 | Ht 66.0 in | Wt 212.0 lb

## 2022-01-01 DIAGNOSIS — Z23 Encounter for immunization: Secondary | ICD-10-CM

## 2022-01-01 DIAGNOSIS — I10 Essential (primary) hypertension: Secondary | ICD-10-CM

## 2022-01-01 DIAGNOSIS — E782 Mixed hyperlipidemia: Secondary | ICD-10-CM

## 2022-01-01 DIAGNOSIS — E1142 Type 2 diabetes mellitus with diabetic polyneuropathy: Secondary | ICD-10-CM | POA: Diagnosis not present

## 2022-01-01 DIAGNOSIS — K5790 Diverticulosis of intestine, part unspecified, without perforation or abscess without bleeding: Secondary | ICD-10-CM | POA: Diagnosis not present

## 2022-01-01 DIAGNOSIS — Z794 Long term (current) use of insulin: Secondary | ICD-10-CM

## 2022-01-01 DIAGNOSIS — N289 Disorder of kidney and ureter, unspecified: Secondary | ICD-10-CM

## 2022-01-01 DIAGNOSIS — N3281 Overactive bladder: Secondary | ICD-10-CM | POA: Diagnosis not present

## 2022-01-01 DIAGNOSIS — Z6841 Body Mass Index (BMI) 40.0 and over, adult: Secondary | ICD-10-CM

## 2022-01-01 DIAGNOSIS — F320 Major depressive disorder, single episode, mild: Secondary | ICD-10-CM

## 2022-01-01 LAB — BAYER DCA HB A1C WAIVED: HB A1C (BAYER DCA - WAIVED): 6.6 % — ABNORMAL HIGH (ref 4.8–5.6)

## 2022-01-01 MED ORDER — LISINOPRIL-HYDROCHLOROTHIAZIDE 20-25 MG PO TABS: 1.0000 | ORAL_TABLET | Freq: Every day | ORAL | 1 refills | Status: DC

## 2022-01-01 MED ORDER — GABAPENTIN 400 MG PO CAPS: ORAL_CAPSULE | ORAL | 1 refills | Status: DC

## 2022-01-01 MED ORDER — METFORMIN HCL 1000 MG PO TABS: 1000.0000 mg | ORAL_TABLET | Freq: Two times a day (BID) | ORAL | 1 refills | Status: DC

## 2022-01-01 MED ORDER — INSULIN NPH (HUMAN) (ISOPHANE) 100 UNIT/ML ~~LOC~~ SUSP: SUBCUTANEOUS | 5 refills | Status: DC

## 2022-01-01 MED ORDER — INSULIN REGULAR HUMAN 100 UNIT/ML IJ SOLN: INTRAMUSCULAR | 5 refills | Status: DC

## 2022-01-01 MED ORDER — CITALOPRAM HYDROBROMIDE 40 MG PO TABS: 40.0000 mg | ORAL_TABLET | Freq: Every day | ORAL | 1 refills | Status: DC

## 2022-01-01 MED ORDER — DILTIAZEM HCL ER COATED BEADS 240 MG PO CP24: 240.0000 mg | ORAL_CAPSULE | Freq: Every day | ORAL | 1 refills | Status: DC

## 2022-01-01 NOTE — Patient Instructions (Signed)

## 2022-01-01 NOTE — Addendum Note (Signed)
Addended by: Rolena Infante on: 01/01/2022 10:59 AM ? ? Modules accepted: Orders ? ?

## 2022-01-01 NOTE — Progress Notes (Signed)
? ?Subjective:  ? ? Patient ID: Adrienne Cook, female    DOB: 07-Jan-1941, 81 y.o.   MRN: [id number] ? ? ?Chief Complaint: medical management of chronic issues  ?  ? ?HPI: ? ?Adrienne Cook is a 81 y.o. who identifies as a female who was assigned female at birth.  ? ?Social history: ?Lives with: daughter ?Work history: retired ? ? ?Comes in today for follow up of the following chronic medical issues: ? ?1. Primary hypertension ?No c/o chest pain, sob or headache. Doesnot check blood pressure at home. ?BP Readings from Last 3 Encounters:  ?09/27/21 133/61  ?07/06/21 122/68  ?03/26/21 124/67  ? ? ? ?2. Mixed hyperlipidemia ?Does try to watch diet  but does little to  no exercise. ?Lab Results  ?Component Value Date  ? CHOL 130 09/27/2021  ? HDL 44 09/27/2021  ? [place] 61 09/27/2021  ? TRIG 141 09/27/2021  ? CHOLHDL 3.0 09/27/2021  ? ?The 10-year ASCVD risk score (Arnett DK, et al., 2019) is: 53.7% ? ? ?3. Type 2 diabetes mellitus with diabetic polyneuropathy, with long-term current use of insulin ([place]) ?She checks her blood sugars 2x a day. Fasting blood sugar is usually 98-126. She denies nay low blood sugars. ?Lab Results  ?Component Value Date  ? HGBA1C 7.7 (H) 09/27/2021  ? ? ? ?4. Diverticulosis ?Denies any recent flare ups ? ?5. OAB (overactive bladder) ?Is on no prescription meds and is doing ok ? ?6. Renal insufficiency ?Lab Results  ?Component Value Date  ? CREATININE 1.15 (H) 09/27/2021  ? ? ? ?7. Depression, major, single episode, mild ([place]) ?Is on celexa and is doing well. ? ?  01/01/2022  ? 10:09 AM 11/16/2021  ?  2:42 PM 09/27/2021  ? 10:19 AM  ?Depression screen PHQ 2/9  ?Decreased Interest 0 0 0  ?Down, Depressed, Hopeless 1 0 0  ?PHQ - 2 Score 1 0 0  ?Altered sleeping 0 0 0  ?Tired, decreased energy 1 0 0  ?Change in appetite 0 0 0  ?Feeling bad or failure about yourself  0 0 0  ?Trouble concentrating 0 0 0  ?Moving slowly or fidgety/restless 0 0 0  ?Suicidal thoughts 0 0 0  ?PHQ-9 Score 2 0 0   ?Difficult doing work/chores Not difficult at all Not difficult at all Not difficult at all  ? ? ? ?8. Class 3 severe obesity due to excess calories with serious comorbidity and body mass index (BMI) greater than or equal to 70 in adult [facility]) ?Weight is up 5 lbs from last visit ? ?Wt Readings from Last 3 Encounters:  ?01/01/22 212 lb (96.2 kg)  ?11/16/21 207 lb 12.8 oz (94.3 kg)  ?09/27/21 215 lb (97.5 kg)  ? ?BMI Readings from Last 3 Encounters:  ?01/01/22 34.22 kg/m?  ?11/16/21 33.54 kg/m?  ?09/27/21 34.70 kg/m?  ? ? ? ?New complaints: ?None today ? ?No Known Allergies ?Outpatient Encounter Medications as of 01/01/2022  ?Medication Sig  ? aspirin 81 MG EC tablet Take 1 tablet (81 mg total) by mouth daily.  ? calcium elemental as carbonate (BARIATRIC TUMS ULTRA) 400 MG chewable tablet Chew 1,000 mg by mouth 3 (three) times daily.  ? celecoxib (CELEBREX) 200 MG capsule Take 1 capsule (200 mg total) by mouth 2 (two) times daily.  ? Cholecalciferol (VITAMIN D3) 5000 UNITS TABS Take 5,000 Units by mouth daily.  ? citalopram (CELEXA) 40 MG tablet TAKE 1 TABLET BY MOUTH EVERY DAY  ? clobetasol cream (TEMOVATE) 0.05 %  Apply 1 application topically 2 (two) times daily.  ? diltiazem (CARDIZEM CD) 240 MG 24 hr capsule Take 1 capsule (240 mg total) by mouth daily.  ? fluticasone (FLONASE) 50 MCG/ACT nasal spray SPRAY 2 SPRAYS INTO EACH NOSTRIL EVERY DAY  ? gabapentin (NEURONTIN) 400 MG capsule TAKE 1 TO 3 CAPSULES BY MOUTH AT BEDTIME  ? glucose blood (ONETOUCH ULTRA) test strip TEST TWICE DAILY E11.9  ? insulin NPH Human (HUMULIN N) 100 UNIT/ML injection INJECT 0.4 MLS (40 UNITS TOTAL) INTO THE SKIN 2 (TWO) TIMES DAILY.  ? insulin regular (HUMULIN R) 100 units/mL injection INJECT 10 TO 20 UNITS SUBCUTANEOUSLY THREE TIMES DAILY BEFORE MEAL(S)  ? Insulin Syringe-Needle U-100 (BD INSULIN SYRINGE U/F) 30G X 1/2" 0.5 ML MISC Inject into skin 5 times daily Dx E10.65  ? lisinopril-hydrochlorothiazide (ZESTORETIC) 20-25 MG tablet  TAKE 1 TABLET BY MOUTH EVERY DAY  ? metFORMIN (GLUCOPHAGE) 1000 MG tablet TAKE 1 TABLET (1,000 MG TOTAL) BY MOUTH 2 (TWO) TIMES DAILY WITH A MEAL.  ? simvastatin (ZOCOR) 40 MG tablet TAKE 1 TABLET BY MOUTH EVERY DAY  ? ?No facility-administered encounter medications on file as of 01/01/2022.  ? ? ?Past Surgical History:  ?Procedure Laterality Date  ? APPENDECTOMY    ? BREAST CYST EXCISION    ? benign  ? CARPAL TUNNEL RELEASE    ? CHOLECYSTECTOMY    ? COLONOSCOPY  02/13/2012  ? EXAMINATION UNDER ANESTHESIA    ? and sphincterotomy  ? HAMMER TOE SURGERY    ? bilateral; 5th toes  ? LEFT HEART CATH  02/28/2012  ? Procedure: LEFT HEART CATH;  Surgeon: Hillary Bow, MD;  Location: [facility] CATH LAB;  Service: Cardiovascular;;  ? LEFT HEART CATHETERIZATION WITH CORONARY ANGIOGRAM N/A 02/28/2012  ? Procedure: LEFT HEART CATHETERIZATION WITH CORONARY ANGIOGRAM;  Surgeon: Hillary Bow, MD;  Location: [facility] CATH LAB;  Service: Cardiovascular;  Laterality: N/A;  ? VAGINAL HYSTERECTOMY  1978  ? ? ?Family History  ?Problem Relation Age of Onset  ? Heart failure Mother   ?     CHF   ? COPD Father   ? Cancer Brother   ?     prostate  ? Kidney Stones Brother   ? Epilepsy Daughter   ? Kidney Stones Daughter   ? Colon cancer Neg Hx   ? Stomach cancer Neg Hx   ? ? ? ? ?Controlled substance contract: n/a ? ? ? ? ?Review of Systems  ?Constitutional:  Negative for diaphoresis.  ?Eyes:  Negative for pain.  ?Respiratory:  Negative for shortness of breath.   ?Cardiovascular:  Negative for chest pain, palpitations and leg swelling.  ?Gastrointestinal:  Negative for abdominal pain.  ?Endocrine: Negative for polydipsia.  ?Skin:  Negative for rash.  ?Neurological:  Negative for dizziness, weakness and headaches.  ?Hematological:  Does not bruise/bleed easily.  ?All other systems reviewed and are negative. ? ?   ?Objective:  ? Physical Exam ?Vitals and nursing note reviewed.  ?Constitutional:   ?   General: She is not in acute distress. ?   Appearance:  Normal appearance. She is well-developed.  ?HENT:  ?   Head: Normocephalic.  ?   Right Ear: Tympanic membrane normal.  ?   Left Ear: Tympanic membrane normal.  ?   Nose: Nose normal.  ?   Mouth/Throat:  ?   Mouth: Mucous membranes are moist.  ?Eyes:  ?   Pupils: Pupils are equal, round, and reactive to light.  ?Neck:  ?  Vascular: No carotid bruit or JVD.  ?Cardiovascular:  ?   Rate and Rhythm: Normal rate and regular rhythm.  ?   Heart sounds: Normal heart sounds.  ?Pulmonary:  ?   Effort: Pulmonary effort is normal. No respiratory distress.  ?   Breath sounds: Normal breath sounds. No wheezing or rales.  ?Chest:  ?   Chest wall: No tenderness.  ?Abdominal:  ?   General: Bowel sounds are normal. There is no distension or abdominal bruit.  ?   Palpations: Abdomen is soft. There is no hepatomegaly, splenomegaly, mass or pulsatile mass.  ?   Tenderness: There is no abdominal tenderness.  ?Musculoskeletal:     ?   General: Normal range of motion.  ?   Cervical back: Normal range of motion and neck supple.  ?Lymphadenopathy:  ?   Cervical: No cervical adenopathy.  ?Skin: ?   General: Skin is warm and dry.  ?Neurological:  ?   Mental Status: She is alert and oriented to person, place, and time.  ?   Deep Tendon Reflexes: Reflexes are normal and symmetric.  ?Psychiatric:     ?   Behavior: Behavior normal.     ?   Thought Content: Thought content normal.     ?   Judgment: Judgment normal.  ? ? ? ?BP (!) 119/56   Pulse (!) 52   Temp 97.6 ?F (36.4 ?C) (Temporal)   Resp 20   Ht $R'5\' 6"'pQ$  (1.676 m)   Wt 212 lb (96.2 kg)   SpO2 93%   BMI 34.22 kg/m?  ? ?HGBA1c 6.6% ?   ?Assessment & Plan:  ?Adrienne Cook comes in today with chief complaint of Medical Management of Chronic Issues ? ? ?Diagnosis and orders addressed: ? ?1. Primary hypertension ?Low sodium diet ?- CBC with Differential/Platelet ?- CMP14+EGFR ?- lisinopril-hydrochlorothiazide (ZESTORETIC) 20-25 MG tablet; Take 1 tablet by mouth daily.  Dispense: 90 tablet;  Refill: 1 ?- diltiazem (CARDIZEM CD) 240 MG 24 hr capsule; Take 1 capsule (240 mg total) by mouth daily.  Dispense: 90 capsule; Refill: 1 ? ?2. Mixed hyperlipidemia ?Low fta diet ?- Lipid panel ? ?3. Type 2 diabetes mell

## 2022-01-02 LAB — CMP14+EGFR
ALT: 11 IU/L (ref 0–32)
AST: 18 IU/L (ref 0–40)
Albumin/Globulin Ratio: 1.7 (ref 1.2–2.2)
Albumin: 4.4 g/dL (ref 3.7–4.7)
Alkaline Phosphatase: 76 IU/L (ref 44–121)
BUN/Creatinine Ratio: 19 (ref 12–28)
BUN: 23 mg/dL (ref 8–27)
Bilirubin Total: 0.3 mg/dL (ref 0.0–1.2)
CO2: 23 mmol/L (ref 20–29)
Calcium: 10.2 mg/dL (ref 8.7–10.3)
Chloride: 101 mmol/L (ref 96–106)
Creatinine, Ser: 1.22 mg/dL — ABNORMAL HIGH (ref 0.57–1.00)
Globulin, Total: 2.6 g/dL (ref 1.5–4.5)
Glucose: 200 mg/dL — ABNORMAL HIGH (ref 70–99)
Potassium: 4.9 mmol/L (ref 3.5–5.2)
Sodium: 139 mmol/L (ref 134–144)
Total Protein: 7 g/dL (ref 6.0–8.5)
eGFR: 45 mL/min/{1.73_m2} — ABNORMAL LOW (ref >59)

## 2022-01-02 LAB — LIPID PANEL
Chol/HDL Ratio: 2.9 ratio (ref 0.0–4.4)
Cholesterol, Total: 137 mg/dL (ref 100–199)
HDL: 47 mg/dL (ref >39)
LDL Chol Calc (NIH): 68 mg/dL (ref 0–99)
Triglycerides: 126 mg/dL (ref 0–149)
VLDL Cholesterol Cal: 22 mg/dL (ref 5–40)

## 2022-01-02 LAB — CBC WITH DIFFERENTIAL/PLATELET
Basophils Absolute: 0.1 10*3/uL (ref 0.0–0.2)
Basos: 1 %
EOS (ABSOLUTE): 0.1 10*3/uL (ref 0.0–0.4)
Eos: 1 %
Hematocrit: 38 % (ref 34.0–46.6)
Hemoglobin: 12.7 g/dL (ref 11.1–15.9)
Immature Grans (Abs): 0 10*3/uL (ref 0.0–0.1)
Immature Granulocytes: 0 %
Lymphocytes Absolute: 2.4 10*3/uL (ref 0.7–3.1)
Lymphs: 22 %
MCH: 30.1 pg (ref 26.6–33.0)
MCHC: 33.4 g/dL (ref 31.5–35.7)
MCV: 90 fL (ref 79–97)
Monocytes Absolute: 0.8 10*3/uL (ref 0.1–0.9)
Monocytes: 7 %
Neutrophils Absolute: 7.5 10*3/uL — ABNORMAL HIGH (ref 1.4–7.0)
Neutrophils: 69 %
Platelets: 433 10*3/uL (ref 150–450)
RBC: 4.22 x10E6/uL (ref 3.77–5.28)
RDW: 13.1 % (ref 11.7–15.4)
WBC: 11 10*3/uL — ABNORMAL HIGH (ref 3.4–10.8)

## 2022-01-15 ENCOUNTER — Other Ambulatory Visit: Payer: Self-pay | Admitting: Nurse Practitioner

## 2022-01-15 DIAGNOSIS — J301 Allergic rhinitis due to pollen: Secondary | ICD-10-CM

## 2022-01-31 ENCOUNTER — Ambulatory Visit (INDEPENDENT_AMBULATORY_CARE_PROVIDER_SITE_OTHER): Payer: Medicare Other | Admitting: Nurse Practitioner

## 2022-01-31 ENCOUNTER — Encounter: Payer: Self-pay | Admitting: Nurse Practitioner

## 2022-01-31 VITALS — BP 125/63 | HR 81 | Temp 98.2°F | Resp 20 | Ht 66.0 in | Wt 212.0 lb

## 2022-01-31 DIAGNOSIS — J0100 Acute maxillary sinusitis, unspecified: Secondary | ICD-10-CM | POA: Diagnosis not present

## 2022-01-31 MED ORDER — AMOXICILLIN-POT CLAVULANATE 875-125 MG PO TABS: 1.0000 | ORAL_TABLET | Freq: Two times a day (BID) | ORAL | 0 refills | Status: DC

## 2022-01-31 MED ORDER — BENZONATATE 100 MG PO CAPS: 100.0000 mg | ORAL_CAPSULE | Freq: Two times a day (BID) | ORAL | 0 refills | Status: DC | PRN

## 2022-01-31 NOTE — Progress Notes (Signed)
Subjective:    Patient ID: Adrienne Cook, female    DOB: 08-24-1941, 81 y.o.   MRN: [id number]   Chief Complaint: Cough, Sore Throat, and chest congestion (Coughing up green phlegm)    URI  This is a new problem. Episode onset: MOnday. Associated symptoms include congestion, coughing (productive- greenish), headaches, rhinorrhea, sneezing and a sore throat. She has tried acetaminophen for the symptoms. The treatment provided mild relief.  Covid test was negative      Review of Systems  Constitutional:  Positive for fatigue. Negative for chills and fever.  HENT:  Positive for congestion, rhinorrhea, sneezing and sore throat.   Respiratory:  Positive for cough (productive- greenish).   Neurological:  Positive for headaches.      Objective:   Physical Exam Constitutional:      Appearance: Normal appearance.  HENT:     Right Ear: Tympanic membrane normal. There is no impacted cerumen.     Left Ear: Tympanic membrane normal. There is no impacted cerumen.     Nose: Congestion and rhinorrhea present.     Comments: Bil maxillary sinus tenderness    Mouth/Throat:     Pharynx: No oropharyngeal exudate or posterior oropharyngeal erythema.  Eyes:     Extraocular Movements: Extraocular movements intact.     Pupils: Pupils are equal, round, and reactive to light.  Cardiovascular:     Rate and Rhythm: Normal rate and regular rhythm.     Heart sounds: Normal heart sounds.  Pulmonary:     Effort: Pulmonary effort is normal.     Breath sounds: Normal breath sounds.  Skin:    General: Skin is warm.  Neurological:     General: No focal deficit present.     Mental Status: She is alert.  Psychiatric:        Mood and Affect: Mood normal.        Behavior: Behavior normal.    BP 125/63   Pulse 81   Temp 98.2 F (36.8 C) (Temporal)   Resp 20   Ht '5\' 6"'$  (1.676 m)   Wt 212 lb (96.2 kg)   SpO2 97%   BMI 34.22 kg/m        Assessment & Plan:  [name] in today with  chief complaint of Cough, Sore Throat, and chest congestion (Coughing up green phlegm)   1. Acute non-recurrent maxillary sinusitis 1. Take meds as prescribed 2. Use a cool mist humidifier especially during the winter months and when heat has been humid. 3. Use saline nose sprays frequently 4. Saline irrigations of the nose can be very helpful if done frequently.  * 4X daily for 1 week*  * Use of a nettie pot can be helpful with this. Follow directions with this* 5. Drink plenty of fluids 6. Keep thermostat turn down low 7.For any cough or congestion- tessalon perles 8. For fever or aces or pains- take tylenol or ibuprofen appropriate for age and weight.  * for fevers greater than 101 orally you may alternate ibuprofen and tylenol every  3 hours.    - amoxicillin-clavulanate (AUGMENTIN) 875-125 MG tablet; Take 1 tablet by mouth 2 (two) times daily.  Dispense: 14 tablet; Refill: 0 - benzonatate (TESSALON) 100 MG capsule; Take 1 capsule (100 mg total) by mouth 2 (two) times daily as needed for cough.  Dispense: 20 capsule; Refill: 0    The above assessment and management plan was discussed with the patient. The patient verbalized understanding of and has  agreed to the management plan. Patient is aware to call the clinic if symptoms persist or worsen. Patient is aware when to return to the clinic for a follow-up visit. Patient educated on when it is appropriate to go to the emergency department.   Mary-Margaret Hassell Done, FNP

## 2022-01-31 NOTE — Patient Instructions (Signed)

## 2022-03-04 ENCOUNTER — Other Ambulatory Visit: Payer: Self-pay | Admitting: Nurse Practitioner

## 2022-03-04 DIAGNOSIS — E782 Mixed hyperlipidemia: Secondary | ICD-10-CM

## 2022-04-02 ENCOUNTER — Ambulatory Visit (INDEPENDENT_AMBULATORY_CARE_PROVIDER_SITE_OTHER): Payer: Medicare Other

## 2022-04-02 ENCOUNTER — Ambulatory Visit (INDEPENDENT_AMBULATORY_CARE_PROVIDER_SITE_OTHER): Payer: Medicare Other | Admitting: Nurse Practitioner

## 2022-04-02 ENCOUNTER — Encounter: Payer: Self-pay | Admitting: Nurse Practitioner

## 2022-04-02 VITALS — BP 116/64 | HR 80 | Temp 97.9°F | Resp 20 | Ht 66.0 in | Wt 210.0 lb

## 2022-04-02 DIAGNOSIS — M25561 Pain in right knee: Secondary | ICD-10-CM

## 2022-04-02 DIAGNOSIS — N289 Disorder of kidney and ureter, unspecified: Secondary | ICD-10-CM | POA: Diagnosis not present

## 2022-04-02 DIAGNOSIS — Z794 Long term (current) use of insulin: Secondary | ICD-10-CM

## 2022-04-02 DIAGNOSIS — I10 Essential (primary) hypertension: Secondary | ICD-10-CM

## 2022-04-02 DIAGNOSIS — E1142 Type 2 diabetes mellitus with diabetic polyneuropathy: Secondary | ICD-10-CM | POA: Diagnosis not present

## 2022-04-02 DIAGNOSIS — E782 Mixed hyperlipidemia: Secondary | ICD-10-CM

## 2022-04-02 DIAGNOSIS — Z6841 Body Mass Index (BMI) 40.0 and over, adult: Secondary | ICD-10-CM

## 2022-04-02 DIAGNOSIS — K5790 Diverticulosis of intestine, part unspecified, without perforation or abscess without bleeding: Secondary | ICD-10-CM | POA: Diagnosis not present

## 2022-04-02 DIAGNOSIS — R251 Tremor, unspecified: Secondary | ICD-10-CM

## 2022-04-02 DIAGNOSIS — N3281 Overactive bladder: Secondary | ICD-10-CM

## 2022-04-02 DIAGNOSIS — F320 Major depressive disorder, single episode, mild: Secondary | ICD-10-CM

## 2022-04-02 LAB — CBC WITH DIFFERENTIAL/PLATELET
Basophils Absolute: 0.1 10*3/uL (ref 0.0–0.2)
Basos: 1 %
EOS (ABSOLUTE): 0.2 10*3/uL (ref 0.0–0.4)
Eos: 2 %
Hematocrit: 39.4 % (ref 34.0–46.6)
Hemoglobin: 12.7 g/dL (ref 11.1–15.9)
Immature Grans (Abs): 0 10*3/uL (ref 0.0–0.1)
Immature Granulocytes: 0 %
Lymphocytes Absolute: 2.4 10*3/uL (ref 0.7–3.1)
Lymphs: 26 %
MCH: 29 pg (ref 26.6–33.0)
MCHC: 32.2 g/dL (ref 31.5–35.7)
MCV: 90 fL (ref 79–97)
Monocytes Absolute: 0.8 10*3/uL (ref 0.1–0.9)
Monocytes: 9 %
Neutrophils Absolute: 6 10*3/uL (ref 1.4–7.0)
Neutrophils: 62 %
Platelets: 412 10*3/uL (ref 150–450)
RBC: 4.38 x10E6/uL (ref 3.77–5.28)
RDW: 13.4 % (ref 11.7–15.4)
WBC: 9.5 10*3/uL (ref 3.4–10.8)

## 2022-04-02 LAB — CMP14+EGFR
ALT: 15 IU/L (ref 0–32)
AST: 19 IU/L (ref 0–40)
Albumin/Globulin Ratio: 1.6 (ref 1.2–2.2)
Albumin: 4.5 g/dL (ref 3.8–4.8)
Alkaline Phosphatase: 73 IU/L (ref 44–121)
BUN/Creatinine Ratio: 20 (ref 12–28)
BUN: 23 mg/dL (ref 8–27)
Bilirubin Total: 0.3 mg/dL (ref 0.0–1.2)
CO2: 25 mmol/L (ref 20–29)
Calcium: 9.4 mg/dL (ref 8.7–10.3)
Chloride: 102 mmol/L (ref 96–106)
Creatinine, Ser: 1.16 mg/dL — ABNORMAL HIGH (ref 0.57–1.00)
Globulin, Total: 2.8 g/dL (ref 1.5–4.5)
Glucose: 139 mg/dL — ABNORMAL HIGH (ref 70–99)
Potassium: 4.9 mmol/L (ref 3.5–5.2)
Sodium: 141 mmol/L (ref 134–144)
Total Protein: 7.3 g/dL (ref 6.0–8.5)
eGFR: 48 mL/min/{1.73_m2} — ABNORMAL LOW (ref >59)

## 2022-04-02 LAB — LIPID PANEL
Chol/HDL Ratio: 2.6 ratio (ref 0.0–4.4)
Cholesterol, Total: 133 mg/dL (ref 100–199)
HDL: 52 mg/dL (ref >39)
LDL Chol Calc (NIH): 61 mg/dL (ref 0–99)
Triglycerides: 110 mg/dL (ref 0–149)
VLDL Cholesterol Cal: 20 mg/dL (ref 5–40)

## 2022-04-02 LAB — BAYER DCA HB A1C WAIVED: HB A1C (BAYER DCA - WAIVED): 7 % — ABNORMAL HIGH (ref 4.8–5.6)

## 2022-04-02 MED ORDER — DILTIAZEM HCL ER COATED BEADS 240 MG PO CP24: 240.0000 mg | ORAL_CAPSULE | Freq: Every day | ORAL | 1 refills | Status: DC

## 2022-04-02 MED ORDER — GABAPENTIN 400 MG PO CAPS: ORAL_CAPSULE | ORAL | 1 refills | Status: DC

## 2022-04-02 MED ORDER — MELOXICAM 15 MG PO TABS: 15.0000 mg | ORAL_TABLET | Freq: Every day | ORAL | 0 refills | Status: DC

## 2022-04-02 MED ORDER — CITALOPRAM HYDROBROMIDE 40 MG PO TABS: 40.0000 mg | ORAL_TABLET | Freq: Every day | ORAL | 1 refills | Status: DC

## 2022-04-02 MED ORDER — INSULIN REGULAR HUMAN 100 UNIT/ML IJ SOLN: INTRAMUSCULAR | 5 refills | Status: DC

## 2022-04-02 MED ORDER — LISINOPRIL-HYDROCHLOROTHIAZIDE 20-25 MG PO TABS: 1.0000 | ORAL_TABLET | Freq: Every day | ORAL | 1 refills | Status: DC

## 2022-04-02 MED ORDER — SIMVASTATIN 40 MG PO TABS: 40.0000 mg | ORAL_TABLET | Freq: Every day | ORAL | 1 refills | Status: DC

## 2022-04-02 MED ORDER — INSULIN NPH (HUMAN) (ISOPHANE) 100 UNIT/ML ~~LOC~~ SUSP: SUBCUTANEOUS | 5 refills | Status: DC

## 2022-04-02 MED ORDER — METFORMIN HCL 1000 MG PO TABS: 1000.0000 mg | ORAL_TABLET | Freq: Two times a day (BID) | ORAL | 1 refills | Status: DC

## 2022-04-02 NOTE — Progress Notes (Signed)
Subjective:    Patient ID: Adrienne Cook, female    DOB: 12/29/1940, 81 y.o.   MRN: [id number]   Chief Complaint: medical management of chronic issues     HPI:  Adrienne Cook is a 81 y.o. who identifies as a female who was assigned female at birth.   Social history: Lives with: daughter and grandson Work history: retired   Scientist, forensic in today for follow up of the following chronic medical issues:  1. Primary hypertension No c/o chest pain, sob or headache. Does not check blood pressure at home. BP Readings from Last 3 Encounters:  01/31/22 125/63  01/01/22 (!) 119/56  09/27/21 133/61     2. Mixed hyperlipidemia Does not watch diet and does no dedicated exrcise. Lab Results  Component Value Date   CHOL 137 01/01/2022   HDL 47 01/01/2022   LDLCALC 68 01/01/2022   TRIG 126 01/01/2022   CHOLHDL 2.9 01/01/2022     3. Type 2 diabetes mellitus with diabetic polyneuropathy, with long-term current use of insulin (HCC) Fasting blood sugars run from 110-130. No low blood sugars. Lab Results  Component Value Date   HGBA1C 6.6 (H) 01/01/2022     4. Diverticulosis No recent flare ups. She tries to watch diet to prevent flare ups.  5. OAB (overactive bladder) Is on no prescription meds for this.   6. Renal insufficiency Lab Results  Component Value Date   CREATININE 1.22 (H) 01/01/2022     7. Tremor of both hands Is on no meds for this. Is not bad enough that it bothers her.  8. Depression, major, single episode, mild (HCC) Is on celexa daily and is doing well on that    04/02/2022    9:48 AM 01/01/2022   10:09 AM 11/16/2021    2:42 PM  Depression screen PHQ 2/9  Decreased Interest 0 0 0  Down, Depressed, Hopeless 0 1 0  PHQ - 2 Score 0 1 0  Altered sleeping 0 0 0  Tired, decreased energy 1 1 0  Change in appetite 0 0 0  Feeling bad or failure about yourself  0 0 0  Trouble concentrating 0 0 0  Moving slowly or fidgety/restless 0 0 0  Suicidal thoughts 0  0 0  PHQ-9 Score 1 2 0  Difficult doing work/chores Not difficult at all Not difficult at all Not difficult at all      04/02/2022    9:48 AM 01/01/2022   10:09 AM 09/27/2021   10:19 AM 07/06/2021   10:50 AM  GAD 7 : Generalized Anxiety Score  Nervous, Anxious, on Edge 1 1 0 0  Control/stop worrying 0 0 0 0  Worry too much - different things _0 Trouble relaxing 0 0 0 0  Restless 0 0 0 0  Easily annoyed or irritable 2 0 1 1  Afraid - awful might happen 0 0 0 0  Total GAD 7 Score _1 Anxiety Difficulty Not difficult at all Not difficult at all Not difficult at all Not difficult at all      9. Class 3 severe obesity due to excess calories with serious comorbidity and body mass index (BMI) greater than or equal to 70 in adult [facility]) No recent weight changes Wt Readings from Last 3 Encounters:  04/02/22 210 lb (95.3 kg)  01/31/22 212 lb (96.2 kg)  01/01/22 212 lb (96.2 kg)   BMI Readings from Last 3 Encounters:  04/02/22 33.89 kg/m  01/31/22 34.22 kg/m  01/01/22 34.22 kg/m      New complaints: Right knee pain- rates 5/10. Pain worse when walking. Rest helps  No Known Allergies Outpatient Encounter Medications as of 04/02/2022  Medication Sig   amoxicillin-clavulanate (AUGMENTIN) 875-125 MG tablet Take 1 tablet by mouth 2 (two) times daily.   aspirin 81 MG EC tablet Take 1 tablet (81 mg total) by mouth daily.   BD INSULIN SYRINGE U/F 30G X 1/2" 0.5 ML MISC INJECT INTO SKIN 5 TIMES DAILY DX E10.65   benzonatate (TESSALON) 100 MG capsule Take 1 capsule (100 mg total) by mouth 2 (two) times daily as needed for cough.   calcium elemental as carbonate (BARIATRIC TUMS ULTRA) 400 MG chewable tablet Chew 1,000 mg by mouth 3 (three) times daily.   Cholecalciferol (VITAMIN D3) 5000 UNITS TABS Take 5,000 Units by mouth daily.   citalopram (CELEXA) 40 MG tablet Take 1 tablet (40 mg total) by mouth daily.   diltiazem (CARDIZEM CD) 240 MG 24 hr capsule Take 1 capsule (240 mg  total) by mouth daily.   fluticasone (FLONASE) 50 MCG/ACT nasal spray SPRAY 2 SPRAYS INTO EACH NOSTRIL EVERY DAY   gabapentin (NEURONTIN) 400 MG capsule TAKE 1 TO 3 CAPSULES BY MOUTH AT BEDTIME   insulin NPH Human (HUMULIN N) 100 UNIT/ML injection INJECT 0.4 MLS (40 UNITS TOTAL) INTO THE SKIN 2 (TWO) TIMES DAILY.   insulin regular (HUMULIN R) 100 units/mL injection INJECT 10 TO 20 UNITS SUBCUTANEOUSLY THREE TIMES DAILY BEFORE MEAL(S)   lisinopril-hydrochlorothiazide (ZESTORETIC) 20-25 MG tablet Take 1 tablet by mouth daily.   metFORMIN (GLUCOPHAGE) 1000 MG tablet Take 1 tablet (1,000 mg total) by mouth 2 (two) times daily with a meal.   ONETOUCH ULTRA test strip TEST TWICE DAILY E11.9   simvastatin (ZOCOR) 40 MG tablet TAKE 1 TABLET BY MOUTH EVERY DAY   No facility-administered encounter medications on file as of 04/02/2022.    Past Surgical History:  Procedure Laterality Date   APPENDECTOMY     BREAST CYST EXCISION     benign   CARPAL TUNNEL RELEASE     CHOLECYSTECTOMY     COLONOSCOPY  02/13/2012   EXAMINATION UNDER ANESTHESIA     and sphincterotomy   HAMMER TOE SURGERY     bilateral; 5th toes   LEFT HEART CATH  02/28/2012   Procedure: LEFT HEART CATH;  Surgeon: Hillary Bow, MD;  Location: [facility] CATH LAB;  Service: Cardiovascular;;   LEFT HEART CATHETERIZATION WITH CORONARY ANGIOGRAM N/A 02/28/2012   Procedure: LEFT HEART CATHETERIZATION WITH CORONARY ANGIOGRAM;  Surgeon: Hillary Bow, MD;  Location: [facility] CATH LAB;  Service: Cardiovascular;  Laterality: N/A;   VAGINAL HYSTERECTOMY  1978    Family History  Problem Relation Age of Onset   Heart failure Mother        CHF    COPD Father    Cancer Brother        prostate   Kidney Stones Brother    Epilepsy Daughter    Kidney Stones Daughter    Colon cancer Neg Hx    Stomach cancer Neg Hx       Controlled substance contract: n/a     Review of Systems  Constitutional:  Negative for diaphoresis.  Eyes:  Negative for  pain.  Respiratory:  Negative for shortness of breath.   Cardiovascular:  Negative for chest pain, palpitations and leg swelling.  Gastrointestinal:  Negative for abdominal pain.  Endocrine: Negative  for polydipsia.  Skin:  Negative for rash.  Neurological:  Negative for dizziness, weakness and headaches.  Hematological:  Does not bruise/bleed easily.  All other systems reviewed and are negative.      Objective:   Physical Exam Vitals and nursing note reviewed.  Constitutional:      General: She is not in acute distress.    Appearance: Normal appearance. She is well-developed.  HENT:     Head: Normocephalic.     Right Ear: Tympanic membrane normal.     Left Ear: Tympanic membrane normal.     Nose: Nose normal.     Mouth/Throat:     Mouth: Mucous membranes are moist.  Eyes:     Pupils: Pupils are equal, round, and reactive to light.  Neck:     Vascular: No carotid bruit or JVD.  Cardiovascular:     Rate and Rhythm: Normal rate and regular rhythm.     Heart sounds: Murmur (2/6 heard loudest at atrial valve) heard.     Comments: Ltiple superficial varicosities of bil lower ext Pulmonary:     Effort: Pulmonary effort is normal. No respiratory distress.     Breath sounds: Normal breath sounds. No wheezing or rales.  Chest:     Chest wall: No tenderness.  Abdominal:     General: Bowel sounds are normal. There is no distension or abdominal bruit.     Palpations: Abdomen is soft. There is no hepatomegaly, splenomegaly, mass or pulsatile mass.     Tenderness: There is no abdominal tenderness.  Musculoskeletal:        General: Normal range of motion.     Cervical back: Normal range of motion and neck supple.     Right lower leg: Edema (1) present.     Left lower leg: Edema (1) present.     Comments: Mild edema right knee Crepitus on flexion and extension 'all ligaments intact  Lymphadenopathy:     Cervical: No cervical adenopathy.  Skin:    General: Skin is warm and dry.   Neurological:     Mental Status: She is alert and oriented to person, place, and time.     Deep Tendon Reflexes: Reflexes are normal and symmetric.  Psychiatric:        Behavior: Behavior normal.        Thought Content: Thought content normal.        Judgment: Judgment normal.     BP 116/64   Pulse 80   Temp 97.9 F (36.6 C) (Temporal)   Resp 20   Ht _0  (1.676 m)   Wt 210 lb (95.3 kg)   SpO2 98%   BMI 33.89 kg/m   Hgba1c 7.0%  Right knee xray- osteoarthritis-Preliminary reading by Ronnald Collum, FNP  [facility]     Assessment & Plan:  Adrienne Cook comes in today with chief complaint of Medical Management of Chronic Issues   Diagnosis and orders addressed:  1. Primary hypertension Low sodium diet - CBC with Differential/Platelet - CMP14+EGFR - diltiazem (CARDIZEM CD) 240 MG 24 hr capsule; Take 1 capsule (240 mg total) by mouth daily.  Dispense: 90 capsule; Refill: 1 - lisinopril-hydrochlorothiazide (ZESTORETIC) 20-25 MG tablet; Take 1 tablet by mouth daily.  Dispense: 90 tablet; Refill: 1  2. Mixed hyperlipidemia Low fat diet - Lipid panel - simvastatin (ZOCOR) 40 MG tablet; Take 1 tablet (40 mg total) by mouth daily.  Dispense: 90 tablet; Refill: 1  3. Type 2 diabetes mellitus with diabetic polyneuropathy,  with long-term current use of insulin (HCC) Continue to watch carbs in diet - Bayer DCA Hb A1c Waived - Microalbumin / creatinine urine ratio - metFORMIN (GLUCOPHAGE) 1000 MG tablet; Take 1 tablet (1,000 mg total) by mouth 2 (two) times daily with a meal.  Dispense: 180 tablet; Refill: 1 - insulin regular (HUMULIN R) 100 units/mL injection; INJECT 10 TO 20 UNITS SUBCUTANEOUSLY THREE TIMES DAILY BEFORE MEAL(S)  Dispense: 30 mL; Refill: 5 - insulin NPH Human (HUMULIN N) 100 UNIT/ML injection; INJECT 0.4 MLS (40 UNITS TOTAL) INTO THE SKIN 2 (TWO) TIMES DAILY.  Dispense: 70 mL; Refill: 5 - gabapentin (NEURONTIN) 400 MG capsule; TAKE 1 TO 3 CAPSULES BY MOUTH AT BEDTIME   Dispense: 270 capsule; Refill: 1  4. Diverticulosis Continue to watch diet to prevent flare up  5. OAB (overactive bladder) Report any idssues  6. Renal insufficiency Labs pending  7. Tremor of both hands Patient will let us know if worsens  8. Depression, major, single episode, mild (HCC) Stress management - citalopram (CELEXA) 40 MG tablet; Take 1 tablet (40 mg total) by mouth daily.  Dispense: 90 tablet; Refill: 1  9. Class 3 severe obesity due to excess calories with serious comorbidity and body mass index (BMI) greater than or equal to 70 in adult [facility]) Discussed diet and exercise for person with BMI >25 Will recheck weight in 3-6 months  10. Acute pain of right knee Moist heat rest - DG Knee 1-2 Views Right - meloxicam (MOBIC) 15 MG tablet; Take 1 tablet (15 mg total) by mouth daily.  Dispense: 30 tablet; Refill: 0   Labs pending Health Maintenance reviewed Diet and exercise encouraged  Follow up plan: 6 months   Mary-Margaret Hassell Done, FNP

## 2022-04-02 NOTE — Patient Instructions (Signed)
Osteoarthritis  Osteoarthritis is a type of arthritis. It refers to joint pain or joint disease. Osteoarthritis affects tissue that covers the ends of bones in joints (cartilage). Cartilage acts as a cushion between the bones and helps them move smoothly. Osteoarthritis occurs when cartilage in the joints gets worn down. Osteoarthritis is sometimes called "wear and tear" arthritis. Osteoarthritis is the most common form of arthritis. It often occurs in older people. It is a condition that gets worse over time. The joints most often affected by this condition are in the fingers, toes, hips, knees, and spine, including the neck and lower back. What are the causes? This condition is caused by the wearing down of cartilage that covers the ends of bones. What increases the risk? The following factors may make you more likely to develop this condition: Being age 50 or older. Obesity. Overuse of joints. Past injury of a joint. Past surgery on a joint. Family history of osteoarthritis. What are the signs or symptoms? The main symptoms of this condition are pain, swelling, and stiffness in the joint. Other symptoms may include: An enlarged joint. More pain and further damage caused by small pieces of bone or cartilage that break off and float inside of the joint. Small deposits of bone (osteophytes) that grow on the edges of the joint. A grating or scraping feeling inside the joint when you move it. Popping or creaking sounds when you move. Difficulty walking or exercising. An inability to grip items, twist your hand(s), or control the movements of your hands and fingers. How is this diagnosed? This condition may be diagnosed based on: Your medical history. A physical exam. Your symptoms. X-rays of the affected joint(s). Blood tests to rule out other types of arthritis. How is this treated? There is no cure for this condition, but treatment can help control pain and improve joint function.  Treatment may include a combination of therapies, such as: Pain relief techniques, such as: Applying heat and cold to the joint. Massage. A form of talk therapy called cognitive behavioral therapy (CBT). This therapy helps you set goals and follow up on the changes that you make. Medicines for pain and inflammation. The medicines can be taken by mouth or applied to the skin. They include: NSAIDs, such as ibuprofen. Prescription medicines. Strong anti-inflammatory medicines (corticosteroids). Certain nutritional supplements. A prescribed exercise program. You may work with a physical therapist. Assistive devices, such as a brace, wrap, splint, specialized glove, or cane. A weight control plan. Surgery, such as: An osteotomy. This is done to reposition the bones and relieve pain or to remove loose pieces of bone and cartilage. Joint replacement surgery. You may need this surgery if you have advanced osteoarthritis. Follow these instructions at home: Activity Rest your affected joints as told by your health care provider. Exercise as told by your health care provider. He or she may recommend specific types of exercise, such as: Strengthening exercises. These are done to strengthen the muscles that support joints affected by arthritis. Aerobic activities. These are exercises, such as brisk walking or water aerobics, that increase your heart rate. Range-of-motion activities. These help your joints move more easily. Balance and agility exercises. Managing pain, stiffness, and swelling     If directed, apply heat to the affected area as often as told by your health care provider. Use the heat source that your health care provider recommends, such as a moist heat pack or a heating pad. If you have a removable assistive device, remove it   as told by your health care provider. Place a towel between your skin and the heat source. If your health care provider tells you to keep the assistive device  on while you apply heat, place a towel between the assistive device and the heat source. Leave the heat on for 20-30 minutes. Remove the heat if your skin turns bright red. This is especially important if you are unable to feel pain, heat, or cold. You may have a greater risk of getting burned. If directed, put ice on the affected area. To do this: If you have a removable assistive device, remove it as told by your health care provider. Put ice in a plastic bag. Place a towel between your skin and the bag. If your health care provider tells you to keep the assistive device on during icing, place a towel between the assistive device and the bag. Leave the ice on for 20 minutes, 2-3 times a day. Move your fingers or toes often to reduce stiffness and swelling. Raise (elevate) the injured area above the level of your heart while you are sitting or lying down. General instructions Take over-the-counter and prescription medicines only as told by your health care provider. Maintain a healthy weight. Follow instructions from your health care provider for weight control. Do not use any products that contain nicotine or tobacco, such as cigarettes, e-cigarettes, and chewing tobacco. If you need help quitting, ask your health care provider. Use assistive devices as told by your health care provider. Keep all follow-up visits as told by your health care provider. This is important. Where to find more information National Institute of Arthritis and Musculoskeletal and Skin Diseases: www.niams.nih.gov National Institute on Aging: www.nia.nih.gov American College of Rheumatology: www.rheumatology.org Contact a health care provider if: You have redness, swelling, or a feeling of warmth in a joint that gets worse. You have a fever along with joint or muscle aches. You develop a rash. You have trouble doing your normal activities. Get help right away if: You have pain that gets worse and is not relieved by  pain medicine. Summary Osteoarthritis is a type of arthritis that affects tissue covering the ends of bones in joints (cartilage). This condition is caused by the wearing down of cartilage that covers the ends of bones. The main symptom of this condition is pain, swelling, and stiffness in the joint. There is no cure for this condition, but treatment can help control pain and improve joint function. This information is not intended to replace advice given to you by your health care provider. Make sure you discuss any questions you have with your health care provider. Document Revised: 08/30/2019 Document Reviewed: 08/30/2019 Elsevier Patient Education  2023 [employer]vier Inc.  

## 2022-04-19 DIAGNOSIS — Z1231 Encounter for screening mammogram for malignant neoplasm of breast: Secondary | ICD-10-CM | POA: Diagnosis not present

## 2022-06-08 ENCOUNTER — Other Ambulatory Visit: Payer: Self-pay | Admitting: Nurse Practitioner

## 2022-06-08 DIAGNOSIS — E782 Mixed hyperlipidemia: Secondary | ICD-10-CM

## 2022-06-25 DIAGNOSIS — Z23 Encounter for immunization: Secondary | ICD-10-CM | POA: Diagnosis not present

## 2022-07-16 ENCOUNTER — Encounter: Payer: Self-pay | Admitting: Internal Medicine

## 2022-08-20 NOTE — Progress Notes (Signed)
[facility] Quality Team Note  Name: Adrienne Cook Date of Birth: 16-Jan-1941 MRN: [id number] Date: 08/20/2022  [facility] Quality Team has reviewed this patient's chart, please see recommendations below:  [facility] Quality Other; ([place]- PATIENT NEEDS URINE MICROALBUMIN/CREATININE RATIO TEST COMPLETED FOR GAP CLOSURE. EGFR HAS ALREADY BEEN COMPLETED. )

## 2022-10-03 ENCOUNTER — Encounter: Payer: Self-pay | Admitting: Nurse Practitioner

## 2022-10-03 ENCOUNTER — Ambulatory Visit (INDEPENDENT_AMBULATORY_CARE_PROVIDER_SITE_OTHER): Payer: Medicare Other | Admitting: Nurse Practitioner

## 2022-10-03 VITALS — BP 128/60 | HR 81 | Temp 97.6°F | Resp 20 | Ht 66.0 in | Wt 206.0 lb

## 2022-10-03 DIAGNOSIS — F320 Major depressive disorder, single episode, mild: Secondary | ICD-10-CM

## 2022-10-03 DIAGNOSIS — E1142 Type 2 diabetes mellitus with diabetic polyneuropathy: Secondary | ICD-10-CM | POA: Diagnosis not present

## 2022-10-03 DIAGNOSIS — K5790 Diverticulosis of intestine, part unspecified, without perforation or abscess without bleeding: Secondary | ICD-10-CM | POA: Diagnosis not present

## 2022-10-03 DIAGNOSIS — E782 Mixed hyperlipidemia: Secondary | ICD-10-CM

## 2022-10-03 DIAGNOSIS — N289 Disorder of kidney and ureter, unspecified: Secondary | ICD-10-CM

## 2022-10-03 DIAGNOSIS — I10 Essential (primary) hypertension: Secondary | ICD-10-CM | POA: Diagnosis not present

## 2022-10-03 DIAGNOSIS — G4733 Obstructive sleep apnea (adult) (pediatric): Secondary | ICD-10-CM | POA: Diagnosis not present

## 2022-10-03 DIAGNOSIS — M25561 Pain in right knee: Secondary | ICD-10-CM | POA: Diagnosis not present

## 2022-10-03 DIAGNOSIS — Z794 Long term (current) use of insulin: Secondary | ICD-10-CM

## 2022-10-03 DIAGNOSIS — Z6841 Body Mass Index (BMI) 40.0 and over, adult: Secondary | ICD-10-CM

## 2022-10-03 DIAGNOSIS — N3281 Overactive bladder: Secondary | ICD-10-CM

## 2022-10-03 LAB — CMP14+EGFR
ALT: 16 IU/L (ref 0–32)
AST: 16 IU/L (ref 0–40)
Albumin/Globulin Ratio: 1.6 (ref 1.2–2.2)
Albumin: 4.4 g/dL (ref 3.8–4.8)
Alkaline Phosphatase: 78 IU/L (ref 44–121)
BUN/Creatinine Ratio: 22 (ref 12–28)
BUN: 26 mg/dL (ref 8–27)
Bilirubin Total: 0.5 mg/dL (ref 0.0–1.2)
CO2: 23 mmol/L (ref 20–29)
Calcium: 10 mg/dL (ref 8.7–10.3)
Chloride: 101 mmol/L (ref 96–106)
Creatinine, Ser: 1.18 mg/dL — ABNORMAL HIGH (ref 0.57–1.00)
Globulin, Total: 2.8 g/dL (ref 1.5–4.5)
Glucose: 133 mg/dL — ABNORMAL HIGH (ref 70–99)
Potassium: 4.9 mmol/L (ref 3.5–5.2)
Sodium: 140 mmol/L (ref 134–144)
Total Protein: 7.2 g/dL (ref 6.0–8.5)
eGFR: 47 mL/min/{1.73_m2} — ABNORMAL LOW (ref >59)

## 2022-10-03 LAB — CBC WITH DIFFERENTIAL/PLATELET
Basophils Absolute: 0.1 10*3/uL (ref 0.0–0.2)
Basos: 1 %
EOS (ABSOLUTE): 0.2 10*3/uL (ref 0.0–0.4)
Eos: 2 %
Hematocrit: 39.1 % (ref 34.0–46.6)
Hemoglobin: 12.8 g/dL (ref 11.1–15.9)
Immature Grans (Abs): 0 10*3/uL (ref 0.0–0.1)
Immature Granulocytes: 0 %
Lymphocytes Absolute: 2.5 10*3/uL (ref 0.7–3.1)
Lymphs: 29 %
MCH: 29.4 pg (ref 26.6–33.0)
MCHC: 32.7 g/dL (ref 31.5–35.7)
MCV: 90 fL (ref 79–97)
Monocytes Absolute: 0.7 10*3/uL (ref 0.1–0.9)
Monocytes: 8 %
Neutrophils Absolute: 5.2 10*3/uL (ref 1.4–7.0)
Neutrophils: 60 %
Platelets: 407 10*3/uL (ref 150–450)
RBC: 4.35 x10E6/uL (ref 3.77–5.28)
RDW: 13.1 % (ref 11.7–15.4)
WBC: 8.6 10*3/uL (ref 3.4–10.8)

## 2022-10-03 LAB — LIPID PANEL
Chol/HDL Ratio: 2.7 ratio (ref 0.0–4.4)
Cholesterol, Total: 135 mg/dL (ref 100–199)
HDL: 50 mg/dL (ref >39)
LDL Chol Calc (NIH): 66 mg/dL (ref 0–99)
Triglycerides: 105 mg/dL (ref 0–149)
VLDL Cholesterol Cal: 19 mg/dL (ref 5–40)

## 2022-10-03 LAB — BAYER DCA HB A1C WAIVED: HB A1C (BAYER DCA - WAIVED): 6.9 % — ABNORMAL HIGH (ref 4.8–5.6)

## 2022-10-03 MED ORDER — LISINOPRIL-HYDROCHLOROTHIAZIDE 20-25 MG PO TABS: 1.0000 | ORAL_TABLET | Freq: Every day | ORAL | 1 refills | Status: DC

## 2022-10-03 MED ORDER — SIMVASTATIN 40 MG PO TABS: 40.0000 mg | ORAL_TABLET | Freq: Every day | ORAL | 1 refills | Status: DC

## 2022-10-03 MED ORDER — NYSTATIN 100000 UNIT/GM EX CREA: 1.0000 | TOPICAL_CREAM | Freq: Two times a day (BID) | CUTANEOUS | 2 refills | Status: DC

## 2022-10-03 MED ORDER — DILTIAZEM HCL ER COATED BEADS 240 MG PO CP24: 240.0000 mg | ORAL_CAPSULE | Freq: Every day | ORAL | 1 refills | Status: DC

## 2022-10-03 MED ORDER — GABAPENTIN 400 MG PO CAPS: ORAL_CAPSULE | ORAL | 1 refills | Status: DC

## 2022-10-03 MED ORDER — METFORMIN HCL 1000 MG PO TABS: 1000.0000 mg | ORAL_TABLET | Freq: Two times a day (BID) | ORAL | 1 refills | Status: DC

## 2022-10-03 MED ORDER — CITALOPRAM HYDROBROMIDE 40 MG PO TABS: 40.0000 mg | ORAL_TABLET | Freq: Every day | ORAL | 1 refills | Status: DC

## 2022-10-03 MED ORDER — MELOXICAM 15 MG PO TABS: 15.0000 mg | ORAL_TABLET | Freq: Every day | ORAL | 2 refills | Status: DC

## 2022-10-03 NOTE — Patient Instructions (Signed)

## 2022-10-03 NOTE — Progress Notes (Signed)
Subjective:    Patient ID: Adrienne Cook, female    DOB: [birth year], 82 y.o.   MRN: [id number]   Chief Complaint: medical management of chronic issues     HPI:  Adrienne Cook is a 82 y.o. who identifies as a female who was assigned female at birth.   Social history: Lives with: daughter and grandson Work history: retired   Scientist, forensic in today for follow up of the following chronic medical issues:  1. Primary hypertension No c/o chest pain, sob or headache does not check blood pressure at home. BP Readings from Last 3 Encounters:  04/02/22 116/64  01/31/22 125/63  01/01/22 (!) 119/56     2. Mixed hyperlipidemia Doe snot wathc diet and doe sno dedicate dexercise. Lab Results  Component Value Date   CHOL 133 04/02/2022   HDL 52 04/02/2022   LDLCALC 61 04/02/2022   TRIG 110 04/02/2022   CHOLHDL 2.6 04/02/2022     3. Type 2 diabetes mellitus with diabetic polyneuropathy, with long-term current use of insulin (HCC) Fasting blood sugars arerunning around 120-150. She does not check them every day. Lab Results  Component Value Date   HGBA1C 7.0 (H) 04/02/2022     4. Diverticulosis Denies any recent flare ups.  5. Renal insufficiency Lab Results  Component Value Date   CREATININE 1.16 (H) 04/02/2022     6. Depression, major, single episode, mild ([place]) Has been on celexa for several years. Is dong well without side effects.    10/03/2022   10:24 AM 04/02/2022    9:48 AM 01/01/2022   10:09 AM  Depression screen PHQ 2/9  Decreased Interest 1 0 0  Down, Depressed, Hopeless 1 0 1  PHQ - 2 Score 2 0 1  Altered sleeping 0 0 0  Tired, decreased energy '1 1 1  '$ Change in appetite 0 0 0  Feeling bad or failure about yourself  0 0 0  Trouble concentrating 0 0 0  Moving slowly or fidgety/restless 0 0 0  Suicidal thoughts 1 0 0  PHQ-9 Score '4 1 2  '$ Difficult doing work/chores Not difficult at all Not difficult at all Not difficult at all     7. OAB (overactive  bladder) Is currently not bother ing her  8. Obstructive sleep apnea syndrome Doe snot wear cpap machine  9. Class 3 severe obesity due to excess calories with serious comorbidity and body mass index (BMI) greater than or equal to 70 in adult [facility]) No recent weight changes Wt Readings from Last 3 Encounters:  10/03/22 206 lb (93.4 kg)  04/02/22 210 lb (95.3 kg)  01/31/22 212 lb (96.2 kg)   BMI Readings from Last 3 Encounters:  10/03/22 33.25 kg/m  04/02/22 33.89 kg/m  01/31/22 34.22 kg/m      New complaints: None today  No Known Allergies Outpatient Encounter Medications as of 10/03/2022  Medication Sig   aspirin 81 MG EC tablet Take 1 tablet (81 mg total) by mouth daily.   BD INSULIN SYRINGE U/F 30G X 1/2" 0.5 ML MISC INJECT INTO SKIN 5 TIMES DAILY DX E10.65   calcium elemental as carbonate (BARIATRIC TUMS ULTRA) 400 MG chewable tablet Chew 1,000 mg by mouth 3 (three) times daily.   Cholecalciferol (VITAMIN D3) 5000 UNITS TABS Take 5,000 Units by mouth daily.   citalopram (CELEXA) 40 MG tablet Take 1 tablet (40 mg total) by mouth daily.   diltiazem (CARDIZEM CD) 240 MG 24 hr capsule Take 1 capsule (240  mg total) by mouth daily.   fluticasone (FLONASE) 50 MCG/ACT nasal spray SPRAY 2 SPRAYS INTO EACH NOSTRIL EVERY DAY   gabapentin (NEURONTIN) 400 MG capsule TAKE 1 TO 3 CAPSULES BY MOUTH AT BEDTIME   insulin NPH Human (HUMULIN N) 100 UNIT/ML injection INJECT 0.4 MLS (40 UNITS TOTAL) INTO THE SKIN 2 (TWO) TIMES DAILY.   insulin regular (HUMULIN R) 100 units/mL injection INJECT 10 TO 20 UNITS SUBCUTANEOUSLY THREE TIMES DAILY BEFORE MEAL(S)   lisinopril-hydrochlorothiazide (ZESTORETIC) 20-25 MG tablet Take 1 tablet by mouth daily.   meloxicam (MOBIC) 15 MG tablet Take 1 tablet (15 mg total) by mouth daily.   metFORMIN (GLUCOPHAGE) 1000 MG tablet Take 1 tablet (1,000 mg total) by mouth 2 (two) times daily with a meal.   ONETOUCH ULTRA test strip TEST TWICE DAILY E11.9    simvastatin (ZOCOR) 40 MG tablet Take 1 tablet (40 mg total) by mouth daily.   No facility-administered encounter medications on file as of 10/03/2022.    Past Surgical History:  Procedure Laterality Date   APPENDECTOMY     BREAST CYST EXCISION     benign   CARPAL TUNNEL RELEASE     CHOLECYSTECTOMY     COLONOSCOPY  02/13/2012   EXAMINATION UNDER ANESTHESIA     and sphincterotomy   HAMMER TOE SURGERY     bilateral; 5th toes   LEFT HEART CATH  02/28/2012   Procedure: LEFT HEART CATH;  Surgeon: Hillary Bow, MD;  Location[facility] CATH LAB;  Service: Cardiovascular;;   LEFT HEART CATHETERIZATION WITH CORONARY ANGIOGRAM N/A 02/28/2012   Procedure: LEFT HEART CATHETERIZATION WITH CORONARY ANGIOGRAM;  Surgeon: Hillary Bow, MD;  Location: [facility] CATH LAB;  Service: Cardiovascular;  Laterality: N/A;   VAGINAL HYSTERECTOMY  1978    Family History  Problem Relation Age of Onset   Heart failure Mother        CHF    COPD Father    Cancer Brother        prostate   Kidney Stones Brother    Epilepsy Daughter    Kidney Stones Daughter    Colon cancer Neg Hx    Stomach cancer Neg Hx       Controlled substance contract: n/a     Review of Systems  Constitutional:  Negative for diaphoresis.  Eyes:  Negative for pain.  Respiratory:  Negative for shortness of breath.   Cardiovascular:  Negative for chest pain, palpitations and leg swelling.  Gastrointestinal:  Negative for abdominal pain.  Endocrine: Negative for polydipsia.  Skin:  Negative for rash.  Neurological:  Negative for dizziness, weakness and headaches.  Hematological:  Does not bruise/bleed easily.  All other systems reviewed and are negative.      Objective:   Physical Exam Vitals and nursing note reviewed.  Constitutional:      General: She is not in acute distress.    Appearance: Normal appearance. She is well-developed.  HENT:     Head: Normocephalic.     Right Ear: Tympanic membrane normal.     Left Ear:  Tympanic membrane normal.     Nose: Nose normal.     Mouth/Throat:     Mouth: Mucous membranes are moist.  Eyes:     Pupils: Pupils are equal, round, and reactive to light.  Neck:     Vascular: No carotid bruit or JVD.  Cardiovascular:     Rate and Rhythm: Normal rate and regular rhythm.     Heart sounds: Normal  heart sounds.     Comments: Multiple varicose veins bil lower ext Pulmonary:     Effort: Pulmonary effort is normal. No respiratory distress.     Breath sounds: Normal breath sounds. No wheezing or rales.  Chest:     Chest wall: No tenderness.  Abdominal:     General: Bowel sounds are normal. There is no distension or abdominal bruit.     Palpations: Abdomen is soft. There is no hepatomegaly, splenomegaly, mass or pulsatile mass.     Tenderness: There is no abdominal tenderness.  Musculoskeletal:        General: Normal range of motion.     Cervical back: Normal range of motion and neck supple.  Lymphadenopathy:     Cervical: No cervical adenopathy.  Skin:    General: Skin is warm and dry.  Neurological:     Mental Status: She is alert and oriented to person, place, and time.     Deep Tendon Reflexes: Reflexes are normal and symmetric.  Psychiatric:        Behavior: Behavior normal.        Thought Content: Thought content normal.        Judgment: Judgment normal.     BP 128/60   Pulse 81   Temp 97.6 F (36.4 C) (Temporal)   Resp 20   Ht '5\' 6"'$  (1.676 m)   Wt 206 lb (93.4 kg)   SpO2 98%   BMI 33.25 kg/m   HGBa1c 6.9%     Assessment & Plan:   CRYSTIN LECHTENBERG comes in today with chief complaint of Medical Management of Chronic Issues   Diagnosis and orders addressed:  1. Primary hypertension Low sodium diet - CBC with Differential/Platelet - CMP14+EGFR - diltiazem (CARDIZEM CD) 240 MG 24 hr capsule; Take 1 capsule (240 mg total) by mouth daily.  Dispense: 90 capsule; Refill: 1 - lisinopril-hydrochlorothiazide (ZESTORETIC) 20-25 MG tablet; Take 1  tablet by mouth daily.  Dispense: 90 tablet; Refill: 1  2. Mixed hyperlipidemia Low fat diet - Lipid panel - simvastatin (ZOCOR) 40 MG tablet; Take 1 tablet (40 mg total) by mouth daily.  Dispense: 90 tablet; Refill: 1  3. Type 2 diabetes mellitus with diabetic polyneuropathy, with long-term current use of insulin (HCC) Continue to wtatch carbs in diet - Bayer DCA Hb A1c Waived - Microalbumin / creatinine urine ratio - gabapentin (NEURONTIN) 400 MG capsule; TAKE 1 TO 3 CAPSULES BY MOUTH AT BEDTIME  Dispense: 270 capsule; Refill: 1 - metFORMIN (GLUCOPHAGE) 1000 MG tablet; Take 1 tablet (1,000 mg total) by mouth 2 (two) times daily with a meal.  Dispense: 180 tablet; Refill: 1  4. Diverticulosis Watch diet to prevent flare up  5. Renal insufficiency Labs pending  6. Depression, major, single episode, mild (HCC) Stress management - citalopram (CELEXA) 40 MG tablet; Take 1 tablet (40 mg total) by mouth daily.  Dispense: 90 tablet; Refill: 1  7. OAB (overactive bladder)  8. Obstructive sleep apnea syndrome Wear cpap  9. Class 3 severe obesity due to excess calories with serious comorbidity and body mass index (BMI) greater than or equal to 70 in adult ([place])  10. Acute pain of right knee - meloxicam (MOBIC) 15 MG tablet; Take 1 tablet (15 mg total) by mouth daily.  Dispense: 30 tablet; Refill: 2   Labs pending Health Maintenance reviewed Diet and exercise encouraged  Follow up plan: 3 months   Mary-Margaret Hassell Done, FNP

## 2022-10-04 LAB — MICROALBUMIN / CREATININE URINE RATIO
Creatinine, Urine: 189.9 mg/dL
Microalb/Creat Ratio: 10 mg/g creat (ref 0–29)
Microalbumin, Urine: 18.4 ug/mL

## 2022-11-08 DIAGNOSIS — E119 Type 2 diabetes mellitus without complications: Secondary | ICD-10-CM | POA: Diagnosis not present

## 2022-11-08 DIAGNOSIS — H40033 Anatomical narrow angle, bilateral: Secondary | ICD-10-CM | POA: Diagnosis not present

## 2022-11-08 LAB — HM DIABETES EYE EXAM

## 2022-11-19 ENCOUNTER — Ambulatory Visit (INDEPENDENT_AMBULATORY_CARE_PROVIDER_SITE_OTHER): Payer: Medicare Other

## 2022-11-19 VITALS — Ht 67.0 in | Wt 204.0 lb

## 2022-11-19 DIAGNOSIS — Z0000 Encounter for general adult medical examination without abnormal findings: Secondary | ICD-10-CM | POA: Diagnosis not present

## 2022-11-19 NOTE — Patient Instructions (Signed)
Adrienne Cook , Thank you for taking time to come for your Medicare Wellness Visit. I appreciate your ongoing commitment to your health goals. Please review the following plan we discussed and let me know if I can assist you in the future.   These are the goals we discussed:  Goals      DIET - INCREASE WATER INTAKE     Try to drink 6-8 glasses of water daily.     Exercise 150 min/wk Moderate Activity        This is a list of the screening recommended for you and due dates:  Health Maintenance  Topic Date Due   Eye exam for diabetics  03/22/2020   COVID-19 Vaccine (5 - 2023-24 season) 05/17/2022   Complete foot exam   09/27/2022   Hemoglobin A1C  04/03/2023   Yearly kidney function blood test for diabetes  10/04/2023   Yearly kidney health urinalysis for diabetes  10/04/2023   Medicare Annual Wellness Visit  11/19/2023   Mammogram  04/19/2024   DTaP/Tdap/Td vaccine (2 - Td or Tdap) 06/29/2028   Pneumonia Vaccine  Completed   Flu Shot  Completed   DEXA scan (bone density measurement)  Completed   Zoster (Shingles) Vaccine  Completed   HPV Vaccine  Aged Out    Advanced directives: Advance directive discussed with you today. I have provided a copy for you to complete at home and have notarized. Once this is complete please bring a copy in to our office so we can scan it into your chart.   Conditions/risks identified: Aim for 30 minutes of exercise or brisk walking, 6-8 glasses of water, and 5 servings of fruits and vegetables each day.   Next appointment: Follow up in one year for your annual wellness visit    Preventive Care 65 Years and Older, Female Preventive care refers to lifestyle choices and visits with your health care provider that can promote health and wellness. What does preventive care include? A yearly physical exam. This is also called an annual well check. Dental exams once or twice a year. Routine eye exams. Ask your health care provider how often you should  have your eyes checked. Personal lifestyle choices, including: Daily care of your teeth and gums. Regular physical activity. Eating a healthy diet. Avoiding tobacco and drug use. Limiting alcohol use. Practicing safe sex. Taking low-dose aspirin every day. Taking vitamin and mineral supplements as recommended by your health care provider. What happens during an annual well check? The services and screenings done by your health care provider during your annual well check will depend on your age, overall health, lifestyle risk factors, and family history of disease. Counseling  Your health care provider may ask you questions about your: Alcohol use. Tobacco use. Drug use. Emotional well-being. Home and relationship well-being. Sexual activity. Eating habits. History of falls. Memory and ability to understand (cognition). Work and work Statistician. Reproductive health. Screening  You may have the following tests or measurements: Height, weight, and BMI. Blood pressure. Lipid and cholesterol levels. These may be checked every 5 years, or more frequently if you are over 63 years old. Skin check. Lung cancer screening. You may have this screening every year starting at age 31 if you have a 30-pack-year history of smoking and currently smoke or have quit within the past 15 years. Fecal occult blood test (FOBT) of the stool. You may have this test every year starting at age 51. Flexible sigmoidoscopy or colonoscopy. You may have  a sigmoidoscopy every 5 years or a colonoscopy every 10 years starting at age 40. Hepatitis C blood test. Hepatitis B blood test. Sexually transmitted disease (STD) testing. Diabetes screening. This is done by checking your blood sugar (glucose) after you have not eaten for a while (fasting). You may have this done every 1-3 years. Bone density scan. This is done to screen for osteoporosis. You may have this done starting at age 40. Mammogram. This may be done  every 1-2 years. Talk to your health care provider about how often you should have regular mammograms. Talk with your health care provider about your test results, treatment options, and if necessary, the need for more tests. Vaccines  Your health care provider may recommend certain vaccines, such as: Influenza vaccine. This is recommended every year. Tetanus, diphtheria, and acellular pertussis (Tdap, Td) vaccine. You may need a Td booster every 10 years. Zoster vaccine. You may need this after age 45. Pneumococcal 13-valent conjugate (PCV13) vaccine. One dose is recommended after age 53. Pneumococcal polysaccharide (PPSV23) vaccine. One dose is recommended after age 29. Talk to your health care provider about which screenings and vaccines you need and how often you need them. This information is not intended to replace advice given to you by your health care provider. Make sure you discuss any questions you have with your health care provider. Document Released: 09/29/2015 Document Revised: 05/22/2016 Document Reviewed: 07/04/2015 Elsevier Interactive Patient Education  2017 [place] Prevention in the Home Falls can cause injuries. They can happen to people of all ages. There are many things you can do to make your home safe and to help prevent falls. What can I do on the outside of my home? Regularly fix the edges of walkways and driveways and fix any cracks. Remove anything that might make you trip as you walk through a door, such as a raised step or threshold. Trim any bushes or trees on the path to your home. Use bright outdoor lighting. Clear any walking paths of anything that might make someone trip, such as rocks or tools. Regularly check to see if handrails are loose or broken. Make sure that both sides of any steps have handrails. Any raised decks and porches should have guardrails on the edges. Have any leaves, snow, or ice cleared regularly. Use sand or salt on  walking paths during winter. Clean up any spills in your garage right away. This includes oil or grease spills. What can I do in the bathroom? Use night lights. Install grab bars by the toilet and in the tub and shower. Do not use towel bars as grab bars. Use non-skid mats or decals in the tub or shower. If you need to sit down in the shower, use a plastic, non-slip stool. Keep the floor dry. Clean up any water that spills on the floor as soon as it happens. Remove soap buildup in the tub or shower regularly. Attach bath mats securely with double-sided non-slip rug tape. Do not have throw rugs and other things on the floor that can make you trip. What can I do in the bedroom? Use night lights. Make sure that you have a light by your bed that is easy to reach. Do not use any sheets or blankets that are too big for your bed. They should not hang down onto the floor. Have a firm chair that has side arms. You can use this for support while you get dressed. Do not have throw rugs and  other things on the floor that can make you trip. What can I do in the kitchen? Clean up any spills right away. Avoid walking on wet floors. Keep items that you use a lot in easy-to-reach places. If you need to reach something above you, use a strong step stool that has a grab bar. Keep electrical cords out of the way. Do not use floor polish or wax that makes floors slippery. If you must use wax, use non-skid floor wax. Do not have throw rugs and other things on the floor that can make you trip. What can I do with my stairs? Do not leave any items on the stairs. Make sure that there are handrails on both sides of the stairs and use them. Fix handrails that are broken or loose. Make sure that handrails are as long as the stairways. Check any carpeting to make sure that it is firmly attached to the stairs. Fix any carpet that is loose or worn. Avoid having throw rugs at the top or bottom of the stairs. If you do  have throw rugs, attach them to the floor with carpet tape. Make sure that you have a light switch at the top of the stairs and the bottom of the stairs. If you do not have them, ask someone to add them for you. What else can I do to help prevent falls? Wear shoes that: Do not have high heels. Have rubber bottoms. Are comfortable and fit you well. Are closed at the toe. Do not wear sandals. If you use a stepladder: Make sure that it is fully opened. Do not climb a closed stepladder. Make sure that both sides of the stepladder are locked into place. Ask someone to hold it for you, if possible. Clearly mark and make sure that you can see: Any grab bars or handrails. First and last steps. Where the edge of each step is. Use tools that help you move around (mobility aids) if they are needed. These include: Canes. Walkers. Scooters. Crutches. Turn on the lights when you go into a dark area. Replace any light bulbs as soon as they burn out. Set up your furniture so you have a clear path. Avoid moving your furniture around. If any of your floors are uneven, fix them. If there are any pets around you, be aware of where they are. Review your medicines with your doctor. Some medicines can make you feel dizzy. This can increase your chance of falling. Ask your doctor what other things that you can do to help prevent falls. This information is not intended to replace advice given to you by your health care provider. Make sure you discuss any questions you have with your health care provider. Document Released: 06/29/2009 Document Revised: 02/08/2016 Document Reviewed: 10/07/2014 Elsevier Interactive Patient Education  2017 [employer].

## 2022-11-19 NOTE — Progress Notes (Signed)
Subjective:   Adrienne Cook is a 82 y.o. female who presents for Medicare Annual (Subsequent) preventive examination. I connected with  [name] on 11/19/22 by a audio enabled telemedicine application and verified that I am speaking with the correct person using two identifiers.  Patient Location: Home  Provider Location: Home Office  I discussed the limitations of evaluation and management by telemedicine. The patient expressed understanding and agreed to proceed.  Review of Systems     Cardiac Risk Factors include: advanced age (>32mn, >>19women);hypertension     Objective:    Today's Vitals   11/19/22 1521  Weight: 204 lb (92.5 kg)  Height: '5\' 7"'$  (1.702 m)   Body mass index is 31.95 kg/m.     11/19/2022    3:25 PM 11/16/2021    2:45 PM 11/10/2020    3:42 PM 07/01/2019    8:35 AM 12/25/2016   11:48 AM 10/27/2014    9:47 PM 02/28/2012    1:58 PM  Advanced Directives  Does Patient Have a Medical Advance Directive? No No No No No No Patient does not have advance directive  Would patient like information on creating a medical advance directive? No - Patient declined No - Patient declined No - Patient declined No - Patient declined  No - patient declined information   Pre-existing out of facility DNR order (yellow form or pink MOST form)       No    Current Medications (verified) Outpatient Encounter Medications as of 11/19/2022  Medication Sig   aspirin 81 MG EC tablet Take 1 tablet (81 mg total) by mouth daily.   BD INSULIN SYRINGE U/F 30G X 1/2" 0.5 ML MISC INJECT INTO SKIN 5 TIMES DAILY DX E10.65   calcium elemental as carbonate (BARIATRIC TUMS ULTRA) 400 MG chewable tablet Chew 1,000 mg by mouth 3 (three) times daily.   Cholecalciferol (VITAMIN D3) 5000 UNITS TABS Take 5,000 Units by mouth daily.   citalopram (CELEXA) 40 MG tablet Take 1 tablet (40 mg total) by mouth daily.   diltiazem (CARDIZEM CD) 240 MG 24 hr capsule Take 1 capsule (240 mg total) by mouth  daily.   fluticasone (FLONASE) 50 MCG/ACT nasal spray SPRAY 2 SPRAYS INTO EACH NOSTRIL EVERY DAY   gabapentin (NEURONTIN) 400 MG capsule TAKE 1 TO 3 CAPSULES BY MOUTH AT BEDTIME   insulin NPH Human (HUMULIN N) 100 UNIT/ML injection INJECT 0.4 MLS (40 UNITS TOTAL) INTO THE SKIN 2 (TWO) TIMES DAILY.   insulin regular (HUMULIN R) 100 units/mL injection INJECT 10 TO 20 UNITS SUBCUTANEOUSLY THREE TIMES DAILY BEFORE MEAL(S)   lisinopril-hydrochlorothiazide (ZESTORETIC) 20-25 MG tablet Take 1 tablet by mouth daily.   meloxicam (MOBIC) 15 MG tablet Take 1 tablet (15 mg total) by mouth daily.   metFORMIN (GLUCOPHAGE) 1000 MG tablet Take 1 tablet (1,000 mg total) by mouth 2 (two) times daily with a meal.   nystatin cream (MYCOSTATIN) Apply 1 Application topically 2 (two) times daily.   ONETOUCH ULTRA test strip TEST TWICE DAILY E11.9   simvastatin (ZOCOR) 40 MG tablet Take 1 tablet (40 mg total) by mouth daily.   No facility-administered encounter medications on file as of 11/19/2022.    Allergies (verified) Patient has no known allergies.   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 normal  event monitor   Obesity    PONV (postoperative nausea and vomiting)    Seasonal allergies    Sleep apnea    does not tolerate CPAP   Past Surgical History:  Procedure Laterality Date   APPENDECTOMY     BREAST CYST EXCISION     benign   CARPAL TUNNEL RELEASE     CHOLECYSTECTOMY     COLONOSCOPY  02/13/2012   EXAMINATION UNDER ANESTHESIA     and sphincterotomy   HAMMER TOE SURGERY     bilateral; 5th toes   LEFT HEART CATH  02/28/2012   Procedure: LEFT HEART CATH;  Surgeon: Hillary Bow, MD;  Location: [facility] C[facility] LAB;  Service: Cardiovascular;;   LEFT HEART CATHETERIZATION WITH CORONARY ANGIOGRAM N/A 02/28/2012   Procedure: LEFT HEART CATHETERIZATION WITH  CORONARY ANGIOGRAM;  Surgeon: Hillary Bow, MD;  Location: [facility] C[facility] LAB;  Service: Cardiovascular;  Laterality: N/A;   VAGINAL HYSTERECTOMY  1978   Family History  Problem Relation Age of Onset   Heart failure Mother        CHF    COPD Father    Cancer Brother        prostate   Kidney Stones Brother    Epilepsy Daughter    Kidney Stones Daughter    Colon cancer Neg Hx    Stomach cancer Neg Hx    Social History   Socioeconomic History   Marital status: Legally Separated    Spouse name: Not on file   Number of children: 1   Years of education: 12   Highest education level: 12th grade  Occupational History   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  Tobacco Use   Smoking status: Never   Smokeless tobacco: Never   Tobacco comments:    tobacco use - no  Vaping Use   Vaping Use: Never used  Substance and Sexual Activity   Alcohol use: No   Drug use: No   Sexual activity: Not Currently    Birth control/protection: Surgical    Comment: hyst  Other Topics Concern   Not on file  Social History Narrative   Lives with daughter    Social Determinants of Health   Financial Resource Strain: Low Risk  (11/19/2022)   Overall Financial Resource Strain (CARDIA)    Difficulty of Paying Living Expenses: Not hard at all  Food Insecurity: No Food Insecurity (11/19/2022)   Hunger Vital Sign    Worried About Running Out of Food in the Last Year: Never true    Ran Out of Food in the Last Year: Never true  Transportation Needs: No Transportation Needs (11/19/2022)   PRAPARE - Hydrologist (Medical): No    Lack of Transportation (Non-Medical): No  Physical Activity: Inactive (11/19/2022)   Exercise Vital Sign    Days of Exercise per Week: 0 days    Minutes of Exercise per Session: 0 min  Stress: No Stress Concern Present (11/19/2022)   [place]    Feeling of Stress : Not at all   Social Connections: Moderately Isolated (11/19/2022)   Social Connection and Isolation Panel [NHANES]    Frequency of Communication with Friends and Family: More than three times a week    Frequency of Social Gatherings with Friends and Family: More than three times a week    Attends Religious Services: More than 4 times per year    Active Member of  Clubs or Organizations: No    Attends Archivist Meetings: Never    Marital Status: Widowed    Tobacco Counseling Counseling given: Not Answered Tobacco comments: tobacco use - no   Clinical Intake:  Pre-visit preparation completed: Yes  Pain : No/denies pain     Nutritional Risks: None Diabetes: Yes CBG done?: No Did pt. bring in CBG monitor from home?: No  How often do you need to have someone help you when you read instructions, pamphlets, or other written materials from your doctor or pharmacy?: 1 - Never  Diabetic?yes Nutrition Risk Assessment:  Has the patient had any N/V/D within the last 2 months?  No  Does the patient have any non-healing wounds?  No  Has the patient had any unintentional weight loss or weight gain?  No   Diabetes:  Is the patient diabetic?  Yes  If diabetic, was a CBG obtained today?  No  Did the patient bring in their glucometer from home?  No  How often do you monitor your CBG's? 2 x day .   Financial Strains and Diabetes Management:  Are you having any financial strains with the device, your supplies or your medication? No .  Does the patient want to be seen by Chronic Care Management for management of their diabetes?  No  Would the patient like to be referred to a Nutritionist or for Diabetic Management?  No   Diabetic Exams:  Diabetic Eye Exam: Completed 10/2022 Diabetic Foot Exam: Overdue, Pt has been advised about the importance in completing this exam. Pt is scheduled for diabetic foot exam on next office visit .   Interpreter Needed?: No  Information entered by :: Jadene Pierini, LPN   Activities of Daily Living    11/19/2022    3:25 PM  In your present state of health, do you have any difficulty performing the following activities:  Hearing? 0  Vision? 0  Difficulty concentrating or making decisions? 0  Walking or climbing stairs? 0  Dressing or bathing? 0  Doing errands, shopping? 0  Preparing Food and eating ? N  Using the Toilet? N  In the past six months, have you accidently leaked urine? N  Do you have problems with loss of bowel control? N  Managing your Medications? N  Managing your Finances? N  Housekeeping or managing your Housekeeping? N    Patient Care Team: Chevis Pretty, FNP as PCP - General (Family Medicine)  Indicate any recent Medical Services you may have received from other than [facility] providers in the past year (date may be approximate).     Assessment:   This is a routine wellness examination for [place].  Hearing/Vision screen Vision Screening - Comments:: Wears rx glasses - up to date with routine eye exams with  Dr.Lee   Dietary issues and exercise activities discussed: Current Exercise Habits: The patient does not participate in regular exercise at present   Goals Addressed             This Visit's Progress    DIET - INCREASE WATER INTAKE   On track    Try to drink 6-8 glasses of water daily.       Depression Screen    11/19/2022    3:23 PM 10/03/2022   10:24 AM 04/02/2022    9:48 AM 01/01/2022   10:09 AM 11/16/2021    2:42 PM 09/27/2021   10:19 AM 07/06/2021   10:50 AM  PHQ 2/9 Scores  PHQ -  2 Score 0 2 0 1 0 0 0  PHQ- 9 Score 0 '4 1 2 '$ 0 0 0    Fall Risk    11/19/2022    3:23 PM 10/03/2022   10:24 AM 04/02/2022    9:48 AM 01/01/2022   10:09 AM 11/16/2021    2:40 PM  Fall Risk   Falls in the past year? 0 0 0 0 0  Number falls in past yr: 0    0  Injury with Fall? 0    0  Risk for fall due to : No Fall Risks    Orthopedic patient;Medication side effect  Follow up Falls prevention discussed     Falls prevention discussed    [place]:  Any stairs in or around the home? Yes  If so, are there any without handrails? No  Home free of loose throw rugs in walkways, pet beds, electrical cords, etc? Yes  Adequate lighting in your home to reduce risk of falls? Yes   ASSISTIVE DEVICES UTILIZED TO PREVENT FALLS:  Life alert? No  Use of a cane, walker or w/c? No  Grab bars in the bathroom? Yes  Shower chair or bench in shower? No  Elevated toilet seat or a handicapped toilet? No       06/29/2018   10:02 AM  MMSE - Mini Mental State Exam  Orientation to time 5  Orientation to Place 5  Registration 3  Attention/ Calculation 5  Recall 3  Language- name 2 objects 2  Language- repeat 1  Language- follow 3 step command 3  Language- read & follow direction 1  Write a sentence 1  Copy design 1  Total score 30        11/19/2022    3:25 PM 11/16/2021    2:50 PM 11/10/2020    3:45 PM 07/01/2019    8:40 AM  6CIT Screen  What Year? 0 points 0 points 0 points 0 points  What month? 0 points 0 points 0 points 0 points  What time? 0 points 0 points 0 points 0 points  Count back from 20 0 points 0 points 0 points 0 points  Months in reverse 0 points 0 points 0 points 0 points  Repeat phrase 0 points 0 points  2 points  Total Score 0 points 0 points  2 points    Immunizations Immunization History  Administered Date(s) Administered   Fluad Quad(high Dose 65+) 06/15/2019, 07/06/2021   Influenza, High Dose Seasonal PF 06/29/2016, 07/07/2017, 06/29/2018   Influenza-Unspecified 07/10/2020, 07/10/2020, 06/16/2022   Moderna Sars-Covid-2 Vaccination 11/01/2019, 11/29/2019, 09/13/2020, 02/23/2021   Pneumococcal Conjugate-13 10/09/2015   Pneumococcal Polysaccharide-23 07/10/2012   Tdap 06/29/2018   Zoster Recombinat (Shingrix) 09/27/2021, 01/01/2022    TDAP status: Up to date  Flu Vaccine status: Up to date  Pneumococcal vaccine status: Up to  date  Covid-19 vaccine status: Completed vaccines  Qualifies for Shingles Vaccine? Yes   Zostavax completed Yes   Shingrix Completed?: Yes  Screening Tests Health Maintenance  Topic Date Due   OPHTHALMOLOGY EXAM  03/22/2020   COVID-19 Vaccine (5 - 2023-24 season) 05/17/2022   FOOT EXAM  09/27/2022   HEMOGLOBIN A1C  04/03/2023   Diabetic kidney evaluation - eGFR measurement  10/04/2023   Diabetic kidney evaluation - Urine ACR  10/04/2023   Medicare Annual Wellness (AWV)  11/19/2023   MAMMOGRAM  04/19/2024   DTaP/Tdap/Td (2 - Td or Tdap) 06/29/2028   Pneumonia Vaccine 65+  Years old  Completed   INFLUENZA VACCINE  Completed   DEXA SCAN  Completed   Zoster Vaccines- Shingrix  Completed   HPV VACCINES  Aged Out    Health Maintenance  Health Maintenance Due  Topic Date Due   OPHTHALMOLOGY EXAM  03/22/2020   COVID-19 Vaccine (5 - 2023-24 season) 05/17/2022   FOOT EXAM  09/27/2022    Colorectal cancer screening: No longer required.   Mammogram status: No longer required due to age .  Bone Density status: Completed 04/03/2021. Results reflect: Bone density results: OSTEOPENIA. Repeat every 5 years.  Lung Cancer Screening: (Low Dose CT Chest recommended if Age 45-80 years, 30 pack-year currently smoking OR have quit w/in 15years.) does not qualify.   Lung Cancer Screening Referral: n/a  Additional Screening:  Hepatitis C Screening: does not qualify;   Vision Screening: Recommended annual ophthalmology exams for early detection of glaucoma and other disorders of the eye. Is the patient up to date with their annual eye exam?  Yes  Who is the provider or what is the name of the office in which the patient attends annual eye exams? Dr.Lee  If pt is not established with a provider, would they like to be referred to a provider to establish care? No .   Dental Screening: Recommended annual dental exams for proper oral hygiene  Community Resource Referral / Chronic Care  Management: CRR required this visit?  No   CCM required this visit?  No      Plan:     I have personally reviewed and noted the following in the patient's chart:   Medical and social history Use of alcohol, tobacco or illicit drugs  Current medications and supplements including opioid prescriptions. Patient is not currently taking opioid prescriptions. Functional ability and status Nutritional status Physical activity Advanced directives List of other physicians Hospitalizations, surgeries, and ER visits in previous 12 months Vitals Screenings to include cognitive, depression, and falls Referrals and appointments  In addition, I have reviewed and discussed with patient certain preventive protocols, quality metrics, and best practice recommendations. A written personalized care plan for preventive services as well as general preventive health recommendations were provided to patient.     Daphane Shepherd, LPN   [id number]   Nurse Notes: lost her husband 08/2022 declined ref 2300 for grief counseling agreed will contact Pcp if needed

## 2023-01-02 ENCOUNTER — Ambulatory Visit (INDEPENDENT_AMBULATORY_CARE_PROVIDER_SITE_OTHER): Payer: Medicare Other | Admitting: Nurse Practitioner

## 2023-01-02 ENCOUNTER — Encounter: Payer: Self-pay | Admitting: Nurse Practitioner

## 2023-01-02 VITALS — BP 120/70 | HR 74 | Temp 97.9°F | Resp 20 | Ht 67.0 in | Wt 211.0 lb

## 2023-01-02 DIAGNOSIS — I10 Essential (primary) hypertension: Secondary | ICD-10-CM

## 2023-01-02 DIAGNOSIS — K5790 Diverticulosis of intestine, part unspecified, without perforation or abscess without bleeding: Secondary | ICD-10-CM | POA: Diagnosis not present

## 2023-01-02 DIAGNOSIS — Z794 Long term (current) use of insulin: Secondary | ICD-10-CM | POA: Diagnosis not present

## 2023-01-02 DIAGNOSIS — Z780 Asymptomatic menopausal state: Secondary | ICD-10-CM

## 2023-01-02 DIAGNOSIS — E1142 Type 2 diabetes mellitus with diabetic polyneuropathy: Secondary | ICD-10-CM

## 2023-01-02 DIAGNOSIS — G4733 Obstructive sleep apnea (adult) (pediatric): Secondary | ICD-10-CM

## 2023-01-02 DIAGNOSIS — E782 Mixed hyperlipidemia: Secondary | ICD-10-CM

## 2023-01-02 DIAGNOSIS — F320 Major depressive disorder, single episode, mild: Secondary | ICD-10-CM

## 2023-01-02 DIAGNOSIS — Z7984 Long term (current) use of oral hypoglycemic drugs: Secondary | ICD-10-CM

## 2023-01-02 DIAGNOSIS — Z6841 Body Mass Index (BMI) 40.0 and over, adult: Secondary | ICD-10-CM

## 2023-01-02 LAB — LIPID PANEL

## 2023-01-02 LAB — CMP14+EGFR

## 2023-01-02 LAB — BAYER DCA HB A1C WAIVED: HB A1C (BAYER DCA - WAIVED): 7 % — ABNORMAL HIGH (ref 4.8–5.6)

## 2023-01-02 MED ORDER — INSULIN NPH (HUMAN) (ISOPHANE) 100 UNIT/ML ~~LOC~~ SUSP: SUBCUTANEOUS | 5 refills | Status: DC

## 2023-01-02 MED ORDER — GABAPENTIN 400 MG PO CAPS
ORAL_CAPSULE | ORAL | 1 refills | Status: DC
Start: 2023-01-02 — End: 2023-06-20

## 2023-01-02 MED ORDER — DILTIAZEM HCL ER COATED BEADS 240 MG PO CP24
240.0000 mg | ORAL_CAPSULE | Freq: Every day | ORAL | 1 refills | Status: DC
Start: 2023-01-02 — End: 2023-06-20

## 2023-01-02 MED ORDER — CITALOPRAM HYDROBROMIDE 40 MG PO TABS
40.0000 mg | ORAL_TABLET | Freq: Every day | ORAL | 1 refills | Status: DC
Start: 2023-01-02 — End: 2023-06-20

## 2023-01-02 MED ORDER — FREESTYLE LIBRE 2 SENSOR MISC
1.0000 | 5 refills | Status: DC
Start: 2023-01-02 — End: 2023-08-21

## 2023-01-02 MED ORDER — LISINOPRIL-HYDROCHLOROTHIAZIDE 20-25 MG PO TABS
1.0000 | ORAL_TABLET | Freq: Every day | ORAL | 1 refills | Status: DC
Start: 2023-01-02 — End: 2023-06-20

## 2023-01-02 MED ORDER — METFORMIN HCL 1000 MG PO TABS
1000.0000 mg | ORAL_TABLET | Freq: Two times a day (BID) | ORAL | 1 refills | Status: DC
Start: 2023-01-02 — End: 2023-06-02

## 2023-01-02 MED ORDER — INSULIN REGULAR HUMAN 100 UNIT/ML IJ SOLN: INTRAMUSCULAR | 5 refills | Status: DC

## 2023-01-02 MED ORDER — SIMVASTATIN 40 MG PO TABS: 40.0000 mg | ORAL_TABLET | Freq: Every day | ORAL | 1 refills | Status: DC

## 2023-01-02 NOTE — Patient Instructions (Signed)

## 2023-01-02 NOTE — Progress Notes (Signed)
Subjective:    Patient ID: Adrienne Cook, female    DOB: [birth year], 82 y.o.   MRN: [id number]   Chief Complaint: Medical Management of Chronic Issues    HPI:  Adrienne Cook is a 82 y.o. who identifies as a female who was assigned female at birth.   Social history: Lives with: great grandson and daughter Work history: retired   Water engineer in today for follow up of the following chronic medical issues:  1. Primary hypertension No c/o chest pain, sob or headache. Doe snot check blood pressure at home. BP Readings from Last 3 Encounters:  01/02/23 120/70  10/03/22 128/60  04/02/22 116/64     2. Mixed hyperlipidemia Does try to watch diet but does no exercise. Lab Results  Component Value Date   CHOL 135 10/03/2022   HDL 50 10/03/2022   LDLCALC 66 10/03/2022   TRIG 105 10/03/2022   CHOLHDL 2.7 10/03/2022     3. Type 2 diabetes mellitus with diabetic polyneuropathy, with long-term current use of insulin Fasting blood sugars 100-150. No low blood sugars. She wold like CGM. Lab Results  Component Value Date   HGBA1C 6.9 (H) 10/03/2022     4. Post-menopausal No recent issues  5. Obstructive sleep apnea syndrome Does not wear CPAP at night. Feels somewhat rested in mornings  6. Diverticulosis No recent flare ups  7. Depression, major, single episode, mild Is on celexa and is doing well.    01/02/2023   11:27 AM 11/19/2022    3:23 PM 10/03/2022   10:24 AM  Depression screen PHQ 2/9  Decreased Interest 0 0 1  Down, Depressed, Hopeless 2 0 1  PHQ - 2 Score 2 0 2  Altered sleeping 0 0 0  Tired, decreased energy 3 0 1  Change in appetite 0 0 0  Feeling bad or failure about yourself  0 0 0  Trouble concentrating 0 0 0  Moving slowly or fidgety/restless 0 0 0  Suicidal thoughts 0 0 1  PHQ-9 Score 5 0 4  Difficult doing work/chores Not difficult at all Not difficult at all Not difficult at all     8. Class 3 severe obesity due to excess calories with  serious comorbidity and body mass index (BMI) greater than or equal to 70 in adult Weight is up 7 lbs Wt Readings from Last 3 Encounters:  01/02/23 211 lb (95.7 kg)  11/19/22 204 lb (92.5 kg)  10/03/22 206 lb (93.4 kg)   BMI Readings from Last 3 Encounters:  01/02/23 33.05 kg/m  11/19/22 31.95 kg/m  10/03/22 33.25 kg/m      New complaints: Having issue with constipation- this is new for her. She has been eating prunes and trying to increase fiber in diet  No Known Allergies Outpatient Encounter Medications as of 01/02/2023  Medication Sig   aspirin 81 MG EC tablet Take 1 tablet (81 mg total) by mouth daily.   BD INSULIN SYRINGE U/F 30G X 1/2" 0.5 ML MISC INJECT INTO SKIN 5 TIMES DAILY DX E10.65   calcium elemental as carbonate (BARIATRIC TUMS ULTRA) 400 MG chewable tablet Chew 1,000 mg by mouth 3 (three) times daily.   Cholecalciferol (VITAMIN D3) 5000 UNITS TABS Take 5,000 Units by mouth daily.   citalopram (CELEXA) 40 MG tablet Take 1 tablet (40 mg total) by mouth daily.   diltiazem (CARDIZEM CD) 240 MG 24 hr capsule Take 1 capsule (240 mg total) by mouth daily.   gabapentin (NEURONTIN) 400  MG capsule TAKE 1 TO 3 CAPSULES BY MOUTH AT BEDTIME   insulin NPH Human (HUMULIN N) 100 UNIT/ML injection INJECT 0.4 MLS (40 UNITS TOTAL) INTO THE SKIN 2 (TWO) TIMES DAILY.   insulin regular (HUMULIN R) 100 units/mL injection INJECT 10 TO 20 UNITS SUBCUTANEOUSLY THREE TIMES DAILY BEFORE MEAL(S)   lisinopril-hydrochlorothiazide (ZESTORETIC) 20-25 MG tablet Take 1 tablet by mouth daily.   metFORMIN (GLUCOPHAGE) 1000 MG tablet Take 1 tablet (1,000 mg total) by mouth 2 (two) times daily with a meal.   nystatin cream (MYCOSTATIN) Apply 1 Application topically 2 (two) times daily.   ONETOUCH ULTRA test strip TEST TWICE DAILY E11.9   simvastatin (ZOCOR) 40 MG tablet Take 1 tablet (40 mg total) by mouth daily.   [DISCONTINUED] meloxicam (MOBIC) 15 MG tablet Take 1 tablet (15 mg total) by mouth  daily.   fluticasone (FLONASE) 50 MCG/ACT nasal spray SPRAY 2 SPRAYS INTO EACH NOSTRIL EVERY DAY (Patient not taking: Reported on 01/02/2023)   No facility-administered encounter medications on file as of 01/02/2023.    Past Surgical History:  Procedure Laterality Date   APPENDECTOMY     BREAST CYST EXCISION     benign   CARPAL TUNNEL RELEASE     CHOLECYSTECTOMY     COLONOSCOPY  02/13/2012   EXAMINATION UNDER ANESTHESIA     and sphincterotomy   HAMMER TOE SURGERY     bilateral; 5th toes   LEFT HEART CATH  02/28/2012   Procedure: LEFT HEART CATH;  Surgeon: Herby Abraham, MD;  Location: [facility] CATH LAB;  Service: Cardiovascular;;   LEFT HEART CATHETERIZATION WITH CORONARY ANGIOGRAM N/A 02/28/2012   Procedure: LEFT HEART CATHETERIZATION WITH CORONARY ANGIOGRAM;  Surgeon: Herby Abraham, MD;  Location: [facility] CATH LAB;  Service: Cardiovascular;  Laterality: N/A;   VAGINAL HYSTERECTOMY  1978    Family History  Problem Relation Age of Onset   Heart failure Mother        CHF    COPD Father    Cancer Brother        prostate   Kidney Stones Brother    Epilepsy Daughter    Kidney Stones Daughter    Colon cancer Neg Hx    Stomach cancer Neg Hx       Controlled substance contract: n/a     Review of Systems  Constitutional:  Negative for diaphoresis.  Eyes:  Negative for pain.  Respiratory:  Negative for shortness of breath.   Cardiovascular:  Negative for chest pain, palpitations and leg swelling.  Gastrointestinal:  Negative for abdominal pain.  Endocrine: Negative for polydipsia.  Skin:  Negative for rash.  Neurological:  Negative for dizziness, weakness and headaches.  Hematological:  Does not bruise/bleed easily.  All other systems reviewed and are negative.      Objective:   Physical Exam Vitals and nursing note reviewed.  Constitutional:      General: She is not in acute distress.    Appearance: Normal appearance. She is well-developed.  HENT:     Head:  Normocephalic.     Right Ear: Tympanic membrane normal.     Left Ear: Tympanic membrane normal.     Nose: Nose normal.     Mouth/Throat:     Mouth: Mucous membranes are moist.  Eyes:     Pupils: Pupils are equal, round, and reactive to light.  Neck:     Vascular: No carotid bruit or JVD.  Cardiovascular:     Rate and Rhythm: Normal rate  and regular rhythm.     Heart sounds: Normal heart sounds.  Pulmonary:     Effort: Pulmonary effort is normal. No respiratory distress.     Breath sounds: Normal breath sounds. No wheezing or rales.  Chest:     Chest wall: No tenderness.  Abdominal:     General: Bowel sounds are normal. There is no distension or abdominal bruit.     Palpations: Abdomen is soft. There is no hepatomegaly, splenomegaly, mass or pulsatile mass.     Tenderness: There is no abdominal tenderness.  Musculoskeletal:        General: Normal range of motion.     Cervical back: Normal range of motion and neck supple.  Lymphadenopathy:     Cervical: No cervical adenopathy.  Skin:    General: Skin is warm and dry.  Neurological:     Mental Status: She is alert and oriented to person, place, and time.     Deep Tendon Reflexes: Reflexes are normal and symmetric.  Psychiatric:        Behavior: Behavior normal.        Thought Content: Thought content normal.        Judgment: Judgment normal.     BP 120/70   Pulse 74   Temp 97.9 F (36.6 C) (Temporal)   Resp 20   Ht 5\' 7"  (1.702 m)   Wt 211 lb (95.7 kg)   SpO2 95%   BMI 33.05 kg/m   HGBAc1 7.0     Assessment & Plan:  Adrienne Cook comes in today with chief complaint of Medical Management of Chronic Issues   Diagnosis and orders addressed:  1. Primary hypertension Low sodium diet - CBC with Differential/Platelet - CMP14+EGFR - diltiazem (CARDIZEM CD) 240 MG 24 hr capsule; Take 1 capsule (240 mg total) by mouth daily.  Dispense: 90 capsule; Refill: 1 - lisinopril-hydrochlorothiazide (ZESTORETIC) 20-25 MG  tablet; Take 1 tablet by mouth daily.  Dispense: 90 tablet; Refill: 1  2. Mixed hyperlipidemia Low fat diet - Lipid panel - simvastatin (ZOCOR) 40 MG tablet; Take 1 tablet (40 mg total) by mouth daily.  Dispense: 90 tablet; Refill: 1  3. Type 2 diabetes mellitus with diabetic polyneuropathy, with long-term current use of insulin Continue to watch carbs in diet - Bayer DCA Hb A1c Waived - gabapentin (NEURONTIN) 400 MG capsule; TAKE 1 TO 3 CAPSULES BY MOUTH AT BEDTIME  Dispense: 270 capsule; Refill: 1 - insulin NPH Human (HUMULIN N) 100 UNIT/ML injection; INJECT 0.4 MLS (40 UNITS TOTAL) INTO THE SKIN 2 (TWO) TIMES DAILY.  Dispense: 70 mL; Refill: 5 - insulin regular (HUMULIN R) 100 units/mL injection; INJECT 10 TO 20 UNITS SUBCUTANEOUSLY THREE TIMES DAILY BEFORE MEAL(S)  Dispense: 30 mL; Refill: 5 - metFORMIN (GLUCOPHAGE) 1000 MG tablet; Take 1 tablet (1,000 mg total) by mouth 2 (two) times daily with a meal.  Dispense: 180 tablet; Refill: 1 - Continuous Glucose Sensor (FREESTYLE LIBRE 2 SENSOR) MISC; 1 each by Does not apply route every 14 (fourteen) days.  Dispense: 2 each; Refill: 5  4. Post-menopausal - DG [facility] DEXA  5. Obstructive sleep apnea syndrome Will not wear CPAP  6. Diverticulosis Watch diet to prevent flare up  7. Depression, major, single episode, mild Stress management - citalopram (CELEXA) 40 MG tablet; Take 1 tablet (40 mg total) by mouth daily.  Dispense: 90 tablet; Refill: 1  8. Class 3 severe obesity due to excess calories with serious comorbidity and body mass index (BMI) greater  than or equal to 70 in adult Discussed diet and exercise for person with BMI >25 Will recheck weight in 3-6 months    Labs pending Health Maintenance reviewed Diet and exercise encouraged  Follow up plan: 6 months   Mary-Margaret Daphine Deutscher, FNP

## 2023-01-03 LAB — CMP14+EGFR
ALT: 19 IU/L (ref 0–32)
Albumin: 4.1 g/dL (ref 3.8–4.8)
Alkaline Phosphatase: 75 IU/L (ref 44–121)
BUN: 23 mg/dL (ref 8–27)
Bilirubin Total: 0.3 mg/dL (ref 0.0–1.2)
Calcium: 9.5 mg/dL (ref 8.7–10.3)
Chloride: 103 mmol/L (ref 96–106)
Creatinine, Ser: 1.21 mg/dL — ABNORMAL HIGH (ref 0.57–1.00)
Glucose: 151 mg/dL — ABNORMAL HIGH (ref 70–99)
Potassium: 5.3 mmol/L — ABNORMAL HIGH (ref 3.5–5.2)
Sodium: 139 mmol/L (ref 134–144)

## 2023-01-03 LAB — CBC WITH DIFFERENTIAL/PLATELET
Basophils Absolute: 0.1 10*3/uL (ref 0.0–0.2)
Basos: 1 %
EOS (ABSOLUTE): 0.2 10*3/uL (ref 0.0–0.4)
Eos: 2 %
Hematocrit: 37 % (ref 34.0–46.6)
Hemoglobin: 12.2 g/dL (ref 11.1–15.9)
Immature Grans (Abs): 0 10*3/uL (ref 0.0–0.1)
Immature Granulocytes: 0 %
Lymphocytes Absolute: 2.5 10*3/uL (ref 0.7–3.1)
Lymphs: 23 %
MCH: 29.5 pg (ref 26.6–33.0)
MCHC: 33 g/dL (ref 31.5–35.7)
MCV: 89 fL (ref 79–97)
Monocytes Absolute: 0.7 10*3/uL (ref 0.1–0.9)
Monocytes: 7 %
Neutrophils Absolute: 7.4 10*3/uL — ABNORMAL HIGH (ref 1.4–7.0)
Neutrophils: 67 %
Platelets: 361 10*3/uL (ref 150–450)
RBC: 4.14 x10E6/uL (ref 3.77–5.28)
RDW: 13.1 % (ref 11.7–15.4)
WBC: 10.9 10*3/uL — ABNORMAL HIGH (ref 3.4–10.8)

## 2023-01-03 LAB — LIPID PANEL
Cholesterol, Total: 150 mg/dL (ref 100–199)
LDL Chol Calc (NIH): 76 mg/dL (ref 0–99)

## 2023-02-04 ENCOUNTER — Other Ambulatory Visit: Payer: Self-pay | Admitting: Nurse Practitioner

## 2023-02-07 DIAGNOSIS — H10013 Acute follicular conjunctivitis, bilateral: Secondary | ICD-10-CM | POA: Diagnosis not present

## 2023-03-06 ENCOUNTER — Other Ambulatory Visit: Payer: Self-pay | Admitting: Nurse Practitioner

## 2023-05-07 ENCOUNTER — Other Ambulatory Visit: Payer: Self-pay | Admitting: Nurse Practitioner

## 2023-05-07 DIAGNOSIS — Z1231 Encounter for screening mammogram for malignant neoplasm of breast: Secondary | ICD-10-CM

## 2023-05-14 ENCOUNTER — Ambulatory Visit
Admission: RE | Admit: 2023-05-14 | Discharge: 2023-05-14 | Disposition: A | Discharge status: Home / Self Care | Payer: Medicare Other | Source: Ambulatory Visit | Attending: Nurse Practitioner | Admitting: Nurse Practitioner

## 2023-05-14 DIAGNOSIS — Z1231 Encounter for screening mammogram for malignant neoplasm of breast: Secondary | ICD-10-CM

## 2023-05-31 ENCOUNTER — Other Ambulatory Visit: Payer: Self-pay | Admitting: Nurse Practitioner

## 2023-05-31 DIAGNOSIS — Z794 Long term (current) use of insulin: Secondary | ICD-10-CM

## 2023-05-31 DIAGNOSIS — E782 Mixed hyperlipidemia: Secondary | ICD-10-CM

## 2023-06-09 DIAGNOSIS — H2513 Age-related nuclear cataract, bilateral: Secondary | ICD-10-CM | POA: Diagnosis not present

## 2023-06-09 DIAGNOSIS — H35373 Puckering of macula, bilateral: Secondary | ICD-10-CM | POA: Diagnosis not present

## 2023-06-20 ENCOUNTER — Encounter: Payer: Self-pay | Admitting: Nurse Practitioner

## 2023-06-20 ENCOUNTER — Ambulatory Visit (INDEPENDENT_AMBULATORY_CARE_PROVIDER_SITE_OTHER): Payer: Medicare Other

## 2023-06-20 ENCOUNTER — Ambulatory Visit: Payer: Medicare Other | Admitting: Nurse Practitioner

## 2023-06-20 VITALS — BP 124/60 | HR 51 | Temp 97.4°F | Resp 20 | Ht 67.0 in | Wt 212.0 lb

## 2023-06-20 DIAGNOSIS — M85852 Other specified disorders of bone density and structure, left thigh: Secondary | ICD-10-CM | POA: Diagnosis not present

## 2023-06-20 DIAGNOSIS — Z780 Asymptomatic menopausal state: Secondary | ICD-10-CM | POA: Diagnosis not present

## 2023-06-20 DIAGNOSIS — M545 Low back pain, unspecified: Secondary | ICD-10-CM

## 2023-06-20 DIAGNOSIS — E119 Type 2 diabetes mellitus without complications: Secondary | ICD-10-CM | POA: Diagnosis not present

## 2023-06-20 DIAGNOSIS — E785 Hyperlipidemia, unspecified: Secondary | ICD-10-CM

## 2023-06-20 DIAGNOSIS — Z794 Long term (current) use of insulin: Secondary | ICD-10-CM

## 2023-06-20 DIAGNOSIS — I10 Essential (primary) hypertension: Secondary | ICD-10-CM | POA: Diagnosis not present

## 2023-06-20 DIAGNOSIS — K5790 Diverticulosis of intestine, part unspecified, without perforation or abscess without bleeding: Secondary | ICD-10-CM

## 2023-06-20 DIAGNOSIS — Z23 Encounter for immunization: Secondary | ICD-10-CM

## 2023-06-20 DIAGNOSIS — N3281 Overactive bladder: Secondary | ICD-10-CM

## 2023-06-20 DIAGNOSIS — Z7984 Long term (current) use of oral hypoglycemic drugs: Secondary | ICD-10-CM | POA: Diagnosis not present

## 2023-06-20 DIAGNOSIS — F320 Major depressive disorder, single episode, mild: Secondary | ICD-10-CM | POA: Diagnosis not present

## 2023-06-20 DIAGNOSIS — E1169 Type 2 diabetes mellitus with other specified complication: Secondary | ICD-10-CM

## 2023-06-20 DIAGNOSIS — E66813 Obesity, class 3: Secondary | ICD-10-CM

## 2023-06-20 DIAGNOSIS — Z6841 Body Mass Index (BMI) 40.0 and over, adult: Secondary | ICD-10-CM

## 2023-06-20 DIAGNOSIS — G4733 Obstructive sleep apnea (adult) (pediatric): Secondary | ICD-10-CM

## 2023-06-20 DIAGNOSIS — N289 Disorder of kidney and ureter, unspecified: Secondary | ICD-10-CM

## 2023-06-20 LAB — BAYER DCA HB A1C WAIVED: HB A1C (BAYER DCA - WAIVED): 6.9 % — ABNORMAL HIGH (ref 4.8–5.6)

## 2023-06-20 MED ORDER — METFORMIN HCL 1000 MG PO TABS: 1000.0000 mg | ORAL_TABLET | Freq: Two times a day (BID) | ORAL | 1 refills | Status: DC

## 2023-06-20 MED ORDER — INSULIN REGULAR HUMAN 100 UNIT/ML IJ SOLN
INTRAMUSCULAR | 5 refills | Status: DC
Start: 2023-06-20 — End: 2023-12-18

## 2023-06-20 MED ORDER — PREDNISONE 20 MG PO TABS
40.0000 mg | ORAL_TABLET | Freq: Every day | ORAL | 0 refills | Status: AC
Start: 2023-06-20 — End: 2023-06-25

## 2023-06-20 MED ORDER — SIMVASTATIN 40 MG PO TABS: 40.0000 mg | ORAL_TABLET | Freq: Every day | ORAL | 1 refills | Status: DC

## 2023-06-20 MED ORDER — INSULIN NPH (HUMAN) (ISOPHANE) 100 UNIT/ML ~~LOC~~ SUSP
SUBCUTANEOUS | 5 refills | Status: DC
Start: 2023-06-20 — End: 2023-12-18

## 2023-06-20 MED ORDER — LISINOPRIL-HYDROCHLOROTHIAZIDE 20-25 MG PO TABS
1.0000 | ORAL_TABLET | Freq: Every day | ORAL | 1 refills | Status: DC
Start: 2023-06-20 — End: 2023-11-24

## 2023-06-20 MED ORDER — DILTIAZEM HCL ER COATED BEADS 240 MG PO CP24: 240.0000 mg | ORAL_CAPSULE | Freq: Every day | ORAL | 1 refills | Status: DC

## 2023-06-20 MED ORDER — CITALOPRAM HYDROBROMIDE 40 MG PO TABS: 40.0000 mg | ORAL_TABLET | Freq: Every day | ORAL | 1 refills | Status: DC

## 2023-06-20 MED ORDER — GABAPENTIN 400 MG PO CAPS
ORAL_CAPSULE | ORAL | 1 refills | Status: DC
Start: 2023-06-20 — End: 2023-12-18

## 2023-06-20 NOTE — Patient Instructions (Signed)

## 2023-06-20 NOTE — Progress Notes (Signed)
Subjective:    Patient ID: Adrienne Cook, female    DOB: 31-May-1941, 82 y.o.   MRN: [id number]   Chief Complaint: medical management of chronic issues     HPI:  Adrienne Cook is a 82 y.o. who identifies as a female who was assigned female at birth.   Social history: Lives with: daughter and grand son Work history: retired   Water engineer in today for follow up of the following chronic medical issues:  1. Primary hypertension No c/o chest pain, sob or headache. Does not check blood pressure at home. BP Readings from Last 3 Encounters:  01/02/23 120/70  10/03/22 128/60  04/02/22 116/64     2. Diverticulosis Denies any recent flare ups. Denies abdominal pain  3. Diabetes mellitus treated with insulin and oral medication (HCC) Fasting blood sugars are running around 120-140. Lab Results  Component Value Date   HGBA1C 7.0 (H) 01/02/2023     4. Hyperlipidemia associated with type 2 diabetes mellitus (HCC) Does not watch diet and does no dedicated exercise. Lab Results  Component Value Date   CHOL 150 01/02/2023   HDL 51 01/02/2023   LDLCALC 76 01/02/2023   TRIG 131 01/02/2023   CHOLHDL 2.9 01/02/2023     5. Obstructive sleep apnea syndrome Wears cpap most nights. Says she sleeps well.  6. Renal insufficiency Lab Results  Component Value Date   CREATININE 1.21 (H) 01/02/2023     7. OAB (overactive bladder) On no meds forthis right now. Says she feels like she has  to void often. Has to get up at night to void.  8. Depression, major, single episode, mild (HCC) Has been on celexa for several years. Says she is doing okay.    06/20/2023    8:57 AM 01/02/2023   11:27 AM 11/19/2022    3:23 PM  Depression screen PHQ 2/9  Decreased Interest 0 0 0  Down, Depressed, Hopeless 0 2 0  PHQ - 2 Score 0 2 0  Altered sleeping 0 0 0  Tired, decreased energy 0 3 0  Change in appetite 0 0 0  Feeling bad or failure about yourself  0 0 0  Trouble concentrating 0 0 0   Moving slowly or fidgety/restless 0 0 0  Suicidal thoughts 0 0 0  PHQ-9 Score 0 5 0  Difficult doing work/chores Not difficult at all Not difficult at all Not difficult at all     9. Class 3 severe obesity due to excess calories with serious comorbidity and body mass index (BMI) greater than or equal to 70 in adult [facility]) No recent weight changes Wt Readings from Last 3 Encounters:  06/20/23 212 lb (96.2 kg)  01/02/23 211 lb (95.7 kg)  11/19/22 204 lb (92.5 kg)   BMI Readings from Last 3 Encounters:  06/20/23 33.20 kg/m  01/02/23 33.05 kg/m  11/19/22 31.95 kg/m      New complaints: Back pain. Has been hurting for months. She has been taking tylenol with no relief. Rates pain 8/10 today. Pain is constant and does not radiate. Bending and stooping increases pain. Sitting and resting helps.Patient denies injury.  No Known Allergies Outpatient Encounter Medications as of 06/20/2023  Medication Sig   aspirin 81 MG EC tablet Take 1 tablet (81 mg total) by mouth daily.   calcium elemental as carbonate (BARIATRIC TUMS ULTRA) 400 MG chewable tablet Chew 1,000 mg by mouth 3 (three) times daily.   Cholecalciferol (VITAMIN D3) 5000 UNITS TABS Take 5,000  Units by mouth daily.   citalopram (CELEXA) 40 MG tablet Take 1 tablet (40 mg total) by mouth daily.   Continuous Glucose Sensor (FREESTYLE LIBRE 2 SENSOR) MISC 1 each by Does not apply route every 14 (fourteen) days.   diltiazem (CARDIZEM CD) 240 MG 24 hr capsule Take 1 capsule (240 mg total) by mouth daily.   fluticasone (FLONASE) 50 MCG/ACT nasal spray SPRAY 2 SPRAYS INTO EACH NOSTRIL EVERY DAY (Patient not taking: Reported on 01/02/2023)   gabapentin (NEURONTIN) 400 MG capsule TAKE 1 TO 3 CAPSULES BY MOUTH AT BEDTIME   insulin NPH Human (HUMULIN N) 100 UNIT/ML injection INJECT 0.4 MLS (40 UNITS TOTAL) INTO THE SKIN 2 (TWO) TIMES DAILY.   insulin regular (HUMULIN R) 100 units/mL injection INJECT 10 TO 20 UNITS SUBCUTANEOUSLY THREE TIMES  DAILY BEFORE MEAL(S)   Insulin Syringe-Needle U-100 (BD INSULIN SYRINGE U/F) 30G X 1/2" 0.5 ML MISC INJECT INTO SKIN 5 TIMES DAILY E10.65   lisinopril-hydrochlorothiazide (ZESTORETIC) 20-25 MG tablet Take 1 tablet by mouth daily.   metFORMIN (GLUCOPHAGE) 1000 MG tablet TAKE 1 TABLET (1,000 MG TOTAL) BY MOUTH TWICE A DAY WITH FOOD   nystatin cream (MYCOSTATIN) Apply 1 Application topically 2 (two) times daily.   ONETOUCH ULTRA test strip TEST TWICE DAILY E11.9   simvastatin (ZOCOR) 40 MG tablet TAKE 1 TABLET BY MOUTH EVERY DAY   No facility-administered encounter medications on file as of 06/20/2023.    Past Surgical History:  Procedure Laterality Date   APPENDECTOMY     BREAST CYST EXCISION     benign   CARPAL TUNNEL RELEASE     CHOLECYSTECTOMY     COLONOSCOPY  02/13/2012   EXAMINATION UNDER ANESTHESIA     and sphincterotomy   HAMMER TOE SURGERY     bilateral; 5th toes   LEFT HEART CATH  02/28/2012   Procedure: LEFT HEART CATH;  Surgeon: Herby Abraham, MD;  Location: [facility] CATH LAB;  Service: Cardiovascular;;   LEFT HEART CATHETERIZATION WITH CORONARY ANGIOGRAM N/A 02/28/2012   Procedure: LEFT HEART CATHETERIZATION WITH CORONARY ANGIOGRAM;  Surgeon: Herby Abraham, MD;  Location: [facility] CATH LAB;  Service: Cardiovascular;  Laterality: N/A;   VAGINAL HYSTERECTOMY  1978    Family History  Problem Relation Age of Onset   Heart failure Mother        CHF    COPD Father    Cancer Brother        prostate   Kidney Stones Brother    Epilepsy Daughter    Kidney Stones Daughter    Colon cancer Neg Hx    Stomach cancer Neg Hx       Controlled substance contract: n/a     Review of Systems  Constitutional:  Negative for diaphoresis.  Eyes:  Negative for pain.  Respiratory:  Negative for shortness of breath.   Cardiovascular:  Negative for chest pain, palpitations and leg swelling.  Gastrointestinal:  Negative for abdominal pain.  Endocrine: Negative for polydipsia.   Musculoskeletal:  Positive for back pain.  Skin:  Negative for rash.  Neurological:  Negative for dizziness, weakness and headaches.  Hematological:  Does not bruise/bleed easily.  All other systems reviewed and are negative.      Objective:   Physical Exam Vitals and nursing note reviewed.  Constitutional:      General: She is not in acute distress.    Appearance: Normal appearance. She is well-developed.  HENT:     Head: Normocephalic.  Right Ear: Tympanic membrane normal.     Left Ear: Tympanic membrane normal.     Nose: Nose normal.     Mouth/Throat:     Mouth: Mucous membranes are moist.  Eyes:     Pupils: Pupils are equal, round, and reactive to light.  Neck:     Vascular: No carotid bruit or JVD.  Cardiovascular:     Rate and Rhythm: Normal rate and regular rhythm.     Heart sounds: Normal heart sounds.  Pulmonary:     Effort: Pulmonary effort is normal. No respiratory distress.     Breath sounds: Normal breath sounds. No wheezing or rales.  Chest:     Chest wall: No tenderness.  Abdominal:     General: Bowel sounds are normal. There is no distension or abdominal bruit.     Palpations: Abdomen is soft. There is no hepatomegaly, splenomegaly, mass or pulsatile mass.     Tenderness: There is no abdominal tenderness.  Musculoskeletal:        General: Normal range of motion.     Cervical back: Normal range of motion and neck supple.     Comments: Rising slowly from sitting to standing FROM of lumbar spine with pain on extension (-) SLR bil Motor strength and sensation distally intact  Lymphadenopathy:     Cervical: No cervical adenopathy.  Skin:    General: Skin is warm and dry.  Neurological:     Mental Status: She is alert and oriented to person, place, and time.     Deep Tendon Reflexes: Reflexes are normal and symmetric.  Psychiatric:        Behavior: Behavior normal.        Thought Content: Thought content normal.        Judgment: Judgment normal.      BP 124/60   Pulse (!) 51   Temp (!) 97.4 F (36.3 C) (Temporal)   Resp 20   Ht 5\' 7"  (1.702 m)   Wt 212 lb (96.2 kg)   SpO2 97%   BMI 33.20 kg/m   Hgba1c 6.9%     Assessment & Plan:   Adrienne Cook comes in today with chief complaint of Medical Management of Chronic Issues   Diagnosis and orders addressed:  1. Primary hypertension Low sodium diet - CBC with Differential/Platelet - CMP14+EGFR - diltiazem (CARDIZEM CD) 240 MG 24 hr capsule; Take 1 capsule (240 mg total) by mouth daily.  Dispense: 90 capsule; Refill: 1 - lisinopril-hydrochlorothiazide (ZESTORETIC) 20-25 MG tablet; Take 1 tablet by mouth daily.  Dispense: 90 tablet; Refill: 1  2. Diverticulosis Watch diet  to prevent flare up  3. Diabetes mellitus treated with insulin and oral medication (HCC) Continue  to watch carbs in diet - Bayer DCA Hb A1c Waived - gabapentin (NEURONTIN) 400 MG capsule; TAKE 1 TO 3 CAPSULES BY MOUTH AT BEDTIME  Dispense: 270 capsule; Refill: 1 - insulin NPH Human (HUMULIN N) 100 UNIT/ML injection; INJECT 0.4 MLS (40 UNITS TOTAL) INTO THE SKIN 2 (TWO) TIMES DAILY.  Dispense: 70 mL; Refill: 5 - insulin regular (HUMULIN R) 100 units/mL injection; INJECT 10 TO 20 UNITS SUBCUTANEOUSLY THREE TIMES DAILY BEFORE MEAL(S)  Dispense: 30 mL; Refill: 5 - metFORMIN (GLUCOPHAGE) 1000 MG tablet; Take 1 tablet (1,000 mg total) by mouth 2 (two) times daily with a meal.  Dispense: 180 tablet; Refill: 1  4. Hyperlipidemia associated with type 2 diabetes mellitus (HCC) Low fat diet - Lipid panel - simvastatin (ZOCOR) 40 MG  tablet; Take 1 tablet (40 mg total) by mouth daily.  Dispense: 90 tablet; Refill: 1  5. Obstructive sleep apnea syndrome Continue to wear cpap  6. Renal insufficiency  7. OAB (overactive bladder)  8. Depression, major, single episode, mild (HCC) Stress management - citalopram (CELEXA) 40 MG tablet; Take 1 tablet (40 mg total) by mouth daily.  Dispense: 90 tablet; Refill:  1  9. Class 3 severe obesity due to excess calories with serious comorbidity and body mass index (BMI) greater than or equal to 70 in adult [facility]) Discussed diet and exercise for person with BMI >25 Will recheck weight in 3-6 months   10. Acute midline low back pain without sciatica Moist heat Rest  Let me know if doe snot improve. - predniSONE (DELTASONE) 20 MG tablet; Take 2 tablets (40 mg total) by mouth daily with breakfast for 5 days. 2 po daily for 5 days  Dispense: 10 tablet; Refill: 0   Labs pending Health Maintenance reviewed Diet and exercise encouraged  Follow up plan: 6 months   Mary-Margaret Daphine Deutscher, FNP

## 2023-06-21 LAB — CBC WITH DIFFERENTIAL/PLATELET
Basophils Absolute: 0.1 10*3/uL (ref 0.0–0.2)
Basos: 1 %
EOS (ABSOLUTE): 0.2 10*3/uL (ref 0.0–0.4)
Eos: 2 %
Hematocrit: 39.1 % (ref 34.0–46.6)
Hemoglobin: 12.3 g/dL (ref 11.1–15.9)
Immature Grans (Abs): 0 10*3/uL (ref 0.0–0.1)
Immature Granulocytes: 0 %
Lymphocytes Absolute: 2.6 10*3/uL (ref 0.7–3.1)
Lymphs: 29 %
MCH: 29 pg (ref 26.6–33.0)
MCHC: 31.5 g/dL (ref 31.5–35.7)
MCV: 92 fL (ref 79–97)
Monocytes Absolute: 0.7 10*3/uL (ref 0.1–0.9)
Monocytes: 8 %
Neutrophils Absolute: 5.3 10*3/uL (ref 1.4–7.0)
Neutrophils: 60 %
Platelets: 349 10*3/uL (ref 150–450)
RBC: 4.24 x10E6/uL (ref 3.77–5.28)
RDW: 13.4 % (ref 11.7–15.4)
WBC: 9 10*3/uL (ref 3.4–10.8)

## 2023-06-21 LAB — CMP14+EGFR
ALT: 13 [IU]/L (ref 0–32)
AST: 21 [IU]/L (ref 0–40)
Albumin: 4.2 g/dL (ref 3.8–4.8)
Alkaline Phosphatase: 65 [IU]/L (ref 44–121)
BUN/Creatinine Ratio: 22 (ref 12–28)
BUN: 28 mg/dL — ABNORMAL HIGH (ref 8–27)
Bilirubin Total: 0.3 mg/dL (ref 0.0–1.2)
CO2: 22 mmol/L (ref 20–29)
Calcium: 9.7 mg/dL (ref 8.7–10.3)
Chloride: 104 mmol/L (ref 96–106)
Creatinine, Ser: 1.25 mg/dL — ABNORMAL HIGH (ref 0.57–1.00)
Globulin, Total: 2.5 g/dL (ref 1.5–4.5)
Glucose: 138 mg/dL — ABNORMAL HIGH (ref 70–99)
Potassium: 5.1 mmol/L (ref 3.5–5.2)
Sodium: 141 mmol/L (ref 134–144)
Total Protein: 6.7 g/dL (ref 6.0–8.5)
eGFR: 44 mL/min/{1.73_m2} — ABNORMAL LOW (ref >59)

## 2023-06-21 LAB — LIPID PANEL
Cholesterol, Total: 127 mg/dL (ref 100–199)
HDL: 48 mg/dL (ref >39)
LDL CALC COMMENT:: 2.6 ratio (ref 0.0–4.4)
LDL Chol Calc (NIH): 60 mg/dL (ref 0–99)
Triglycerides: 105 mg/dL (ref 0–149)
VLDL Cholesterol Cal: 19 mg/dL (ref 5–40)

## 2023-07-04 DIAGNOSIS — H2512 Age-related nuclear cataract, left eye: Secondary | ICD-10-CM | POA: Diagnosis not present

## 2023-07-10 ENCOUNTER — Other Ambulatory Visit: Payer: Medicare Other

## 2023-07-10 ENCOUNTER — Ambulatory Visit: Payer: Medicare Other | Admitting: Nurse Practitioner

## 2023-08-21 ENCOUNTER — Other Ambulatory Visit: Payer: Self-pay | Admitting: Nurse Practitioner

## 2023-08-21 DIAGNOSIS — Z794 Long term (current) use of insulin: Secondary | ICD-10-CM

## 2023-10-03 DIAGNOSIS — I10 Essential (primary) hypertension: Secondary | ICD-10-CM | POA: Diagnosis not present

## 2023-10-03 DIAGNOSIS — E1136 Type 2 diabetes mellitus with diabetic cataract: Secondary | ICD-10-CM | POA: Diagnosis not present

## 2023-10-03 DIAGNOSIS — Z01818 Encounter for other preprocedural examination: Secondary | ICD-10-CM | POA: Diagnosis not present

## 2023-10-03 DIAGNOSIS — H2511 Age-related nuclear cataract, right eye: Secondary | ICD-10-CM | POA: Diagnosis not present

## 2023-10-03 DIAGNOSIS — E119 Type 2 diabetes mellitus without complications: Secondary | ICD-10-CM | POA: Diagnosis not present

## 2023-10-10 DIAGNOSIS — H04123 Dry eye syndrome of bilateral lacrimal glands: Secondary | ICD-10-CM | POA: Diagnosis not present

## 2023-11-07 DIAGNOSIS — H04123 Dry eye syndrome of bilateral lacrimal glands: Secondary | ICD-10-CM | POA: Diagnosis not present

## 2023-11-22 ENCOUNTER — Other Ambulatory Visit: Payer: Self-pay | Admitting: Nurse Practitioner

## 2023-11-22 DIAGNOSIS — I10 Essential (primary) hypertension: Secondary | ICD-10-CM

## 2023-11-22 DIAGNOSIS — E1142 Type 2 diabetes mellitus with diabetic polyneuropathy: Secondary | ICD-10-CM

## 2023-12-10 DIAGNOSIS — C44319 Basal cell carcinoma of skin of other parts of face: Secondary | ICD-10-CM | POA: Diagnosis not present

## 2023-12-10 DIAGNOSIS — C4401 Basal cell carcinoma of skin of lip: Secondary | ICD-10-CM | POA: Diagnosis not present

## 2023-12-18 ENCOUNTER — Ambulatory Visit (INDEPENDENT_AMBULATORY_CARE_PROVIDER_SITE_OTHER)

## 2023-12-18 ENCOUNTER — Encounter: Payer: Self-pay | Admitting: Nurse Practitioner

## 2023-12-18 ENCOUNTER — Ambulatory Visit: Payer: Medicare Other | Admitting: Nurse Practitioner

## 2023-12-18 VITALS — BP 126/50 | HR 77 | Temp 98.0°F | Ht 67.0 in | Wt 217.0 lb

## 2023-12-18 DIAGNOSIS — Z0001 Encounter for general adult medical examination with abnormal findings: Secondary | ICD-10-CM

## 2023-12-18 DIAGNOSIS — I10 Essential (primary) hypertension: Secondary | ICD-10-CM | POA: Diagnosis not present

## 2023-12-18 DIAGNOSIS — K5790 Diverticulosis of intestine, part unspecified, without perforation or abscess without bleeding: Secondary | ICD-10-CM | POA: Diagnosis not present

## 2023-12-18 DIAGNOSIS — F320 Major depressive disorder, single episode, mild: Secondary | ICD-10-CM | POA: Diagnosis not present

## 2023-12-18 DIAGNOSIS — E66813 Obesity, class 3: Secondary | ICD-10-CM | POA: Diagnosis not present

## 2023-12-18 DIAGNOSIS — Z794 Long term (current) use of insulin: Secondary | ICD-10-CM | POA: Diagnosis not present

## 2023-12-18 DIAGNOSIS — N3281 Overactive bladder: Secondary | ICD-10-CM

## 2023-12-18 DIAGNOSIS — R6889 Other general symptoms and signs: Secondary | ICD-10-CM | POA: Diagnosis not present

## 2023-12-18 DIAGNOSIS — E1169 Type 2 diabetes mellitus with other specified complication: Secondary | ICD-10-CM

## 2023-12-18 DIAGNOSIS — Z0000 Encounter for general adult medical examination without abnormal findings: Secondary | ICD-10-CM

## 2023-12-18 DIAGNOSIS — Z7984 Long term (current) use of oral hypoglycemic drugs: Secondary | ICD-10-CM

## 2023-12-18 DIAGNOSIS — E119 Type 2 diabetes mellitus without complications: Secondary | ICD-10-CM | POA: Diagnosis not present

## 2023-12-18 DIAGNOSIS — Z6841 Body Mass Index (BMI) 40.0 and over, adult: Secondary | ICD-10-CM

## 2023-12-18 DIAGNOSIS — G4733 Obstructive sleep apnea (adult) (pediatric): Secondary | ICD-10-CM | POA: Diagnosis not present

## 2023-12-18 DIAGNOSIS — E785 Hyperlipidemia, unspecified: Secondary | ICD-10-CM | POA: Diagnosis not present

## 2023-12-18 DIAGNOSIS — N289 Disorder of kidney and ureter, unspecified: Secondary | ICD-10-CM

## 2023-12-18 DIAGNOSIS — J849 Interstitial pulmonary disease, unspecified: Secondary | ICD-10-CM | POA: Diagnosis not present

## 2023-12-18 LAB — BAYER DCA HB A1C WAIVED: HB A1C (BAYER DCA - WAIVED): 7.6 % — ABNORMAL HIGH (ref 4.8–5.6)

## 2023-12-18 LAB — LIPID PANEL

## 2023-12-18 MED ORDER — DILTIAZEM HCL ER COATED BEADS 240 MG PO CP24: 240.0000 mg | ORAL_CAPSULE | Freq: Every day | ORAL | 1 refills | Status: DC

## 2023-12-18 MED ORDER — SIMVASTATIN 40 MG PO TABS: 40.0000 mg | ORAL_TABLET | Freq: Every day | ORAL | 1 refills | Status: DC

## 2023-12-18 MED ORDER — INSULIN REGULAR HUMAN 100 UNIT/ML IJ SOLN: INTRAMUSCULAR | 5 refills | Status: DC

## 2023-12-18 MED ORDER — INSULIN NPH (HUMAN) (ISOPHANE) 100 UNIT/ML ~~LOC~~ SUSP: SUBCUTANEOUS | 5 refills | Status: DC

## 2023-12-18 MED ORDER — NYSTATIN 100000 UNIT/GM EX CREA: 1.0000 | TOPICAL_CREAM | Freq: Two times a day (BID) | CUTANEOUS | 2 refills | Status: AC

## 2023-12-18 MED ORDER — GABAPENTIN 400 MG PO CAPS
ORAL_CAPSULE | ORAL | 1 refills | Status: DC
Start: 2023-12-18 — End: 2024-03-29

## 2023-12-18 MED ORDER — CITALOPRAM HYDROBROMIDE 40 MG PO TABS
40.0000 mg | ORAL_TABLET | Freq: Every day | ORAL | 1 refills | Status: DC
Start: 2023-12-18 — End: 2024-03-29

## 2023-12-18 MED ORDER — LISINOPRIL-HYDROCHLOROTHIAZIDE 20-25 MG PO TABS: 1.0000 | ORAL_TABLET | Freq: Every day | ORAL | 1 refills | Status: DC

## 2023-12-18 MED ORDER — METFORMIN HCL 1000 MG PO TABS: 1000.0000 mg | ORAL_TABLET | Freq: Two times a day (BID) | ORAL | 1 refills | Status: DC

## 2023-12-18 NOTE — Progress Notes (Signed)
 Subjective:    Patient ID: Adrienne Cook, female    DOB: 06-07-1941, 83 y.o.   MRN: [id number]   Chief Complaint: annual physical     HPI:  Adrienne Cook is a 83 y.o. who identifies as a female who was assigned female at birth.   Social history: Lives with: daughter and grand son Work history: retired   Water engineer in today for follow up of the following chronic medical issues:  1. Primary hypertension No c/o chest pain, sob or headache. Does not check blood pressure at home. BP Readings from Last 3 Encounters:  06/20/23 124/60  01/02/23 120/70  10/03/22 128/60     2. Diverticulosis Denies any recent flare ups. Denies abdominal pain  3. Diabetes mellitus treated with insulin and oral medication (HCC) Fasting blood sugars are running around 120-140. Lab Results  Component Value Date   HGBA1C 6.9 (H) 06/20/2023     4. Hyperlipidemia associated with type 2 diabetes mellitus (HCC) Does not watch diet and does no dedicated exercise. Lab Results  Component Value Date   CHOL 127 06/20/2023   HDL 48 06/20/2023   LDLCALC 60 06/20/2023   TRIG 105 06/20/2023   CHOLHDL 2.6 06/20/2023     5. Obstructive sleep apnea syndrome Wears cpap most nights. Says she sleeps well.  6. Renal insufficiency Lab Results  Component Value Date   CREATININE 1.25 (H) 06/20/2023     7. OAB (overactive bladder) On no meds forthis right now. Says she feels like she has  to void often. Has to get up at night to void.  8. Depression, major, single episode, mild (HCC) Has been on celexa for several years. Says she is doing okay.    12/18/2023    8:34 AM 06/20/2023    8:57 AM 01/02/2023   11:27 AM  Depression screen PHQ 2/9  Decreased Interest 0 0 0  Down, Depressed, Hopeless 0 0 2  PHQ - 2 Score 0 0 2  Altered sleeping 0 0 0  Tired, decreased energy 1 0 3  Change in appetite 0 0 0  Feeling bad or failure about yourself  0 0 0  Trouble concentrating 0 0 0  Moving slowly or  fidgety/restless 0 0 0  Suicidal thoughts 0 0 0  PHQ-9 Score 1 0 5  Difficult doing work/chores Not difficult at all Not difficult at all Not difficult at all      9. Class 3 severe obesity due to excess calories with serious comorbidity and body mass index (BMI) greater than or equal to 70 in adult [facility]) Weight is up 5  lbs  Wt Readings from Last 3 Encounters:  12/18/23 217 lb (98.4 kg)  06/20/23 212 lb (96.2 kg)  01/02/23 211 lb (95.7 kg)   BMI Readings from Last 3 Encounters:  12/18/23 33.99 kg/m  06/20/23 33.20 kg/m  01/02/23 33.05 kg/m       New complaints: None today  No Known Allergies Outpatient Encounter Medications as of 12/18/2023  Medication Sig   aspirin 81 MG EC tablet Take 1 tablet (81 mg total) by mouth daily.   calcium elemental as carbonate (BARIATRIC TUMS ULTRA) 400 MG chewable tablet Chew 1,000 mg by mouth 3 (three) times daily.   Cholecalciferol (VITAMIN D3) 5000 UNITS TABS Take 5,000 Units by mouth daily.   citalopram (CELEXA) 40 MG tablet Take 1 tablet (40 mg total) by mouth daily.   Continuous Glucose Sensor (FREESTYLE LIBRE 2 SENSOR) MISC APPLY EVERY 14 (  FOURTEEN) DAYS.   diltiazem (CARDIZEM CD) 240 MG 24 hr capsule Take 1 capsule (240 mg total) by mouth daily.   fluticasone (FLONASE) 50 MCG/ACT nasal spray SPRAY 2 SPRAYS INTO EACH NOSTRIL EVERY DAY (Patient not taking: Reported on 01/02/2023)   gabapentin (NEURONTIN) 400 MG capsule TAKE 1 TO 3 CAPSULES BY MOUTH AT BEDTIME   insulin NPH Human (HUMULIN N) 100 UNIT/ML injection INJECT 0.4 MLS (40 UNITS TOTAL) INTO THE SKIN 2 (TWO) TIMES DAILY.   insulin regular (HUMULIN R) 100 units/mL injection INJECT 10 TO 20 UNITS SUBCUTANEOUSLY THREE TIMES DAILY BEFORE MEAL(S)   Insulin Syringe-Needle U-100 (BD INSULIN SYRINGE U/F) 30G X 1/2" 0.5 ML MISC INJECT INTO SKIN 5 TIMES DAILY E10.65   lisinopril-hydrochlorothiazide (ZESTORETIC) 20-25 MG tablet TAKE 1 TABLET BY MOUTH EVERY DAY   metFORMIN (GLUCOPHAGE) 1000  MG tablet Take 1 tablet (1,000 mg total) by mouth 2 (two) times daily with a meal.   nystatin cream (MYCOSTATIN) Apply 1 Application topically 2 (two) times daily.   ONETOUCH ULTRA test strip TEST TWICE DAILY E11.9   simvastatin (ZOCOR) 40 MG tablet Take 1 tablet (40 mg total) by mouth daily.   No facility-administered encounter medications on file as of 12/18/2023.    Past Surgical History:  Procedure Laterality Date   APPENDECTOMY     BREAST CYST EXCISION     benign   CARPAL TUNNEL RELEASE     CHOLECYSTECTOMY     COLONOSCOPY  02/13/2012   EXAMINATION UNDER ANESTHESIA     and sphincterotomy   HAMMER TOE SURGERY     bilateral; 5th toes   LEFT HEART CATH  02/28/2012   Procedure: LEFT HEART CATH;  Surgeon: Herby Abraham, MD;  Location: [facility] CATH LAB;  Service: Cardiovascular;;   LEFT HEART CATHETERIZATION WITH CORONARY ANGIOGRAM N/A 02/28/2012   Procedure: LEFT HEART CATHETERIZATION WITH CORONARY ANGIOGRAM;  Surgeon: Herby Abraham, MD;  Location: [facility] CATH LAB;  Service: Cardiovascular;  Laterality: N/A;   VAGINAL HYSTERECTOMY  1978    Family History  Problem Relation Age of Onset   Heart failure Mother        CHF    COPD Father    Cancer Brother        prostate   Kidney Stones Brother    Epilepsy Daughter    Kidney Stones Daughter    Colon cancer Neg Hx    Stomach cancer Neg Hx       Controlled substance contract: n/a     Review of Systems  Constitutional:  Negative for diaphoresis.  Eyes:  Negative for pain.  Respiratory:  Negative for shortness of breath.   Cardiovascular:  Negative for chest pain, palpitations and leg swelling.  Gastrointestinal:  Negative for abdominal pain.  Endocrine: Negative for polydipsia.  Musculoskeletal:  Positive for back pain.  Skin:  Negative for rash.  Neurological:  Negative for dizziness, weakness and headaches.  Hematological:  Does not bruise/bleed easily.  All other systems reviewed and are negative.      Objective:    Physical Exam Vitals and nursing note reviewed.  Constitutional:      General: She is not in acute distress.    Appearance: Normal appearance. She is well-developed.  HENT:     Head: Normocephalic.     Right Ear: Tympanic membrane normal.     Left Ear: Tympanic membrane normal.     Nose: Nose normal.     Mouth/Throat:     Mouth: Mucous membranes are  moist.  Eyes:     Pupils: Pupils are equal, round, and reactive to light.  Neck:     Vascular: No carotid bruit or JVD.  Cardiovascular:     Rate and Rhythm: Normal rate and regular rhythm.     Heart sounds: Normal heart sounds.  Pulmonary:     Effort: Pulmonary effort is normal. No respiratory distress.     Breath sounds: Normal breath sounds. No wheezing or rales.  Chest:     Chest wall: No tenderness.  Abdominal:     General: Bowel sounds are normal. There is no distension or abdominal bruit.     Palpations: Abdomen is soft. There is no hepatomegaly, splenomegaly, mass or pulsatile mass.     Tenderness: There is no abdominal tenderness.  Musculoskeletal:        General: Normal range of motion.     Cervical back: Normal range of motion and neck supple.     Comments: Rising slowly from sitting to standing FROM of lumbar spine with pain on extension (-) SLR bil Motor strength and sensation distally intact  Lymphadenopathy:     Cervical: No cervical adenopathy.  Skin:    General: Skin is warm and dry.  Neurological:     Mental Status: She is alert and oriented to person, place, and time.     Deep Tendon Reflexes: Reflexes are normal and symmetric.  Psychiatric:        Behavior: Behavior normal.        Thought Content: Thought content normal.        Judgment: Judgment normal.     BP (!) 126/50   Pulse 77   Temp 98 F (36.7 C) (Temporal)   Ht 5\' 7"  (1.702 m)   Wt 217 lb (98.4 kg)   SpO2 92%   BMI 33.99 kg/m   EKG- sinus bradycardia-Mary-Margaret Daphine Deutscher, FNP  Hgba1c 7.6%     Assessment & Plan:   JEREMIAH TARPLEY comes in today with chief complaint of annual physical  Diagnosis and orders addressed:  1. Primary hypertension Low sodium diet - CBC with Differential/Platelet - CMP14+EGFR - diltiazem (CARDIZEM CD) 240 MG 24 hr capsule; Take 1 capsule (240 mg total) by mouth daily.  Dispense: 90 capsule; Refill: 1 - lisinopril-hydrochlorothiazide (ZESTORETIC) 20-25 MG tablet; Take 1 tablet by mouth daily.  Dispense: 90 tablet; Refill: 1  2. Diverticulosis Watch diet  to prevent flare up  3. Diabetes mellitus treated with insulin and oral medication (HCC) Stricter carb counting - Bayer DCA Hb A1c Waived - gabapentin (NEURONTIN) 400 MG capsule; TAKE 1 TO 3 CAPSULES BY MOUTH AT BEDTIME  Dispense: 270 capsule; Refill: 1 - insulin NPH Human (HUMULIN N) 100 UNIT/ML injection; INJECT 0.4 MLS (40 UNITS TOTAL) INTO THE SKIN 2 (TWO) TIMES DAILY.  Dispense: 70 mL; Refill: 5 - insulin regular (HUMULIN R) 100 units/mL injection; INJECT 10 TO 20 UNITS SUBCUTANEOUSLY THREE TIMES DAILY BEFORE MEAL(S)  Dispense: 30 mL; Refill: 5 - metFORMIN (GLUCOPHAGE) 1000 MG tablet; Take 1 tablet (1,000 mg total) by mouth 2 (two) times daily with a meal.  Dispense: 180 tablet; Refill: 1  4. Hyperlipidemia associated with type 2 diabetes mellitus (HCC) Low fat diet - Lipid panel - simvastatin (ZOCOR) 40 MG tablet; Take 1 tablet (40 mg total) by mouth daily.  Dispense: 90 tablet; Refill: 1  5. Obstructive sleep apnea syndrome Continue to wear cpap  6. Renal insufficiency  7. OAB (overactive bladder)  8. Depression, major, single episode,  mild (HCC) Stress management - citalopram (CELEXA) 40 MG tablet; Take 1 tablet (40 mg total) by mouth daily.  Dispense: 90 tablet; Refill: 1  9. Class 3 severe obesity due to excess calories with serious comorbidity and body mass index (BMI) greater than or equal to 70 in adult [facility] Discussed diet and exercise for person with BMI >25 Will recheck weight in 3-6 months   10.  Acute midline low back pain without sciatica Moist heat Rest  Let me know if doe snot improve. - predniSONE (DELTASONE) 20 MG tablet; Take 2 tablets (40 mg total) by mouth daily with breakfast for 5 days. 2 po daily for 5 days  Dispense: 10 tablet; Refill: 0   Labs pending Health Maintenance reviewed Diet and exercise encouraged  Follow up plan: 6 months   Mary-Margaret Daphine Deutscher, FNP

## 2023-12-18 NOTE — Patient Instructions (Signed)

## 2023-12-19 LAB — CMP14+EGFR
ALT: 13 IU/L (ref 0–32)
AST: 18 IU/L (ref 0–40)
Albumin: 4.2 g/dL (ref 3.7–4.7)
Alkaline Phosphatase: 70 IU/L (ref 44–121)
BUN/Creatinine Ratio: 23 (ref 12–28)
BUN: 32 mg/dL — ABNORMAL HIGH (ref 8–27)
Bilirubin Total: 0.2 mg/dL (ref 0.0–1.2)
CO2: 21 mmol/L (ref 20–29)
Calcium: 9.7 mg/dL (ref 8.7–10.3)
Chloride: 104 mmol/L (ref 96–106)
Creatinine, Ser: 1.41 mg/dL — ABNORMAL HIGH (ref 0.57–1.00)
Globulin, Total: 2.3 g/dL (ref 1.5–4.5)
Glucose: 175 mg/dL — ABNORMAL HIGH (ref 70–99)
Potassium: 4.8 mmol/L (ref 3.5–5.2)
Sodium: 141 mmol/L (ref 134–144)
Total Protein: 6.5 g/dL (ref 6.0–8.5)
eGFR: 37 mL/min/{1.73_m2} — ABNORMAL LOW (ref >59)

## 2023-12-19 LAB — CBC WITH DIFFERENTIAL/PLATELET
Basophils Absolute: 0.1 10*3/uL (ref 0.0–0.2)
Basos: 1 %
EOS (ABSOLUTE): 0.2 10*3/uL (ref 0.0–0.4)
Eos: 2 %
Hematocrit: 38.9 % (ref 34.0–46.6)
Hemoglobin: 12 g/dL (ref 11.1–15.9)
Immature Grans (Abs): 0 10*3/uL (ref 0.0–0.1)
Immature Granulocytes: 0 %
Lymphocytes Absolute: 2.5 10*3/uL (ref 0.7–3.1)
Lymphs: 29 %
MCH: 28.6 pg (ref 26.6–33.0)
MCHC: 30.8 g/dL — ABNORMAL LOW (ref 31.5–35.7)
MCV: 93 fL (ref 79–97)
Monocytes Absolute: 0.7 10*3/uL (ref 0.1–0.9)
Monocytes: 8 %
Neutrophils Absolute: 5.2 10*3/uL (ref 1.4–7.0)
Neutrophils: 60 %
Platelets: 339 10*3/uL (ref 150–450)
RBC: 4.2 x10E6/uL (ref 3.77–5.28)
RDW: 13.2 % (ref 11.7–15.4)
WBC: 8.6 10*3/uL (ref 3.4–10.8)

## 2023-12-19 LAB — LIPID PANEL
Cholesterol, Total: 128 mg/dL (ref 100–199)
HDL: 48 mg/dL (ref >39)
LDL CALC COMMENT:: 2.7 ratio (ref 0.0–4.4)
LDL Chol Calc (NIH): 64 mg/dL (ref 0–99)
Triglycerides: 83 mg/dL (ref 0–149)
VLDL Cholesterol Cal: 16 mg/dL (ref 5–40)

## 2023-12-19 LAB — VITAMIN D 25 HYDROXY (VIT D DEFICIENCY, FRACTURES): Vit D, 25-Hydroxy: 80.7 ng/mL (ref 30.0–100.0)

## 2023-12-19 LAB — MICROALBUMIN / CREATININE URINE RATIO
Creatinine, Urine: 200.2 mg/dL
Microalb/Creat Ratio: 9 mg/g{creat} (ref 0–29)
Microalbumin, Urine: 18.4 ug/mL

## 2023-12-19 LAB — VITAMIN B12: Vitamin B-12: 143 pg/mL — ABNORMAL LOW (ref 232–1245)

## 2024-01-07 DIAGNOSIS — Z08 Encounter for follow-up examination after completed treatment for malignant neoplasm: Secondary | ICD-10-CM | POA: Diagnosis not present

## 2024-01-07 DIAGNOSIS — Z85828 Personal history of other malignant neoplasm of skin: Secondary | ICD-10-CM | POA: Diagnosis not present

## 2024-01-07 DIAGNOSIS — L821 Other seborrheic keratosis: Secondary | ICD-10-CM | POA: Diagnosis not present

## 2024-03-27 ENCOUNTER — Other Ambulatory Visit: Payer: Self-pay | Admitting: Nurse Practitioner

## 2024-03-29 ENCOUNTER — Ambulatory Visit: Admitting: Nurse Practitioner

## 2024-03-29 ENCOUNTER — Encounter: Payer: Self-pay | Admitting: Nurse Practitioner

## 2024-03-29 VITALS — BP 136/74 | HR 92 | Temp 98.0°F | Ht 67.0 in | Wt 216.0 lb

## 2024-03-29 DIAGNOSIS — Z794 Long term (current) use of insulin: Secondary | ICD-10-CM | POA: Diagnosis not present

## 2024-03-29 DIAGNOSIS — I10 Essential (primary) hypertension: Secondary | ICD-10-CM

## 2024-03-29 DIAGNOSIS — K5790 Diverticulosis of intestine, part unspecified, without perforation or abscess without bleeding: Secondary | ICD-10-CM

## 2024-03-29 DIAGNOSIS — E1169 Type 2 diabetes mellitus with other specified complication: Secondary | ICD-10-CM

## 2024-03-29 DIAGNOSIS — Z7984 Long term (current) use of oral hypoglycemic drugs: Secondary | ICD-10-CM | POA: Diagnosis not present

## 2024-03-29 DIAGNOSIS — Z6841 Body Mass Index (BMI) 40.0 and over, adult: Secondary | ICD-10-CM

## 2024-03-29 DIAGNOSIS — G4733 Obstructive sleep apnea (adult) (pediatric): Secondary | ICD-10-CM

## 2024-03-29 DIAGNOSIS — E119 Type 2 diabetes mellitus without complications: Secondary | ICD-10-CM | POA: Diagnosis not present

## 2024-03-29 DIAGNOSIS — E785 Hyperlipidemia, unspecified: Secondary | ICD-10-CM

## 2024-03-29 DIAGNOSIS — N3281 Overactive bladder: Secondary | ICD-10-CM

## 2024-03-29 DIAGNOSIS — E66813 Obesity, class 3: Secondary | ICD-10-CM

## 2024-03-29 DIAGNOSIS — N289 Disorder of kidney and ureter, unspecified: Secondary | ICD-10-CM

## 2024-03-29 DIAGNOSIS — F320 Major depressive disorder, single episode, mild: Secondary | ICD-10-CM

## 2024-03-29 LAB — BAYER DCA HB A1C WAIVED: HB A1C (BAYER DCA - WAIVED): 7.8 % — ABNORMAL HIGH (ref 4.8–5.6)

## 2024-03-29 LAB — LIPID PANEL

## 2024-03-29 MED ORDER — GABAPENTIN 400 MG PO CAPS: ORAL_CAPSULE | ORAL | 1 refills | Status: DC

## 2024-03-29 MED ORDER — DILTIAZEM HCL ER COATED BEADS 240 MG PO CP24: 240.0000 mg | ORAL_CAPSULE | Freq: Every day | ORAL | 1 refills | Status: DC

## 2024-03-29 MED ORDER — SIMVASTATIN 40 MG PO TABS: 40.0000 mg | ORAL_TABLET | Freq: Every day | ORAL | 1 refills | Status: DC

## 2024-03-29 MED ORDER — INSULIN NPH (HUMAN) (ISOPHANE) 100 UNIT/ML ~~LOC~~ SUSP: SUBCUTANEOUS | 5 refills | Status: DC

## 2024-03-29 MED ORDER — INSULIN REGULAR HUMAN 100 UNIT/ML IJ SOLN: INTRAMUSCULAR | 5 refills | Status: DC

## 2024-03-29 MED ORDER — LISINOPRIL-HYDROCHLOROTHIAZIDE 20-25 MG PO TABS: 1.0000 | ORAL_TABLET | Freq: Every day | ORAL | 1 refills | Status: DC

## 2024-03-29 MED ORDER — METFORMIN HCL 1000 MG PO TABS: 1000.0000 mg | ORAL_TABLET | Freq: Two times a day (BID) | ORAL | 1 refills | Status: DC

## 2024-03-29 MED ORDER — CITALOPRAM HYDROBROMIDE 40 MG PO TABS: 40.0000 mg | ORAL_TABLET | Freq: Every day | ORAL | 1 refills | Status: DC

## 2024-03-29 NOTE — Progress Notes (Signed)
 ,,,,,,,,,,,,,,,,,,,,,,,,,,,,,,,,,,,,,,,,,,,,,,,,l  Subjective:    Patient ID: Adrienne Cook, female    DOB: 1941-06-20, 83 y.o.   MRN: [id number]   Chief Complaint: medical management of chronic issues     HPI:  Adrienne Cook is a 83 y.o. who identifies as a female who was assigned female at birth.   Social history: Lives with: daughter and grand son Work history: retired   Water engineer in today for follow up of the following chronic medical issues:  1. Primary hypertension No c/o chest pain, sob or headache. Does not check blood pressure at home. BP Readings from Last 3 Encounters:  12/18/23 (!) 126/50  06/20/23 124/60  01/02/23 120/70     2. Diverticulosis Denies any recent flare ups. Denies abdominal pain  3. Diabetes mellitus treated with insulin  and oral medication (HCC) Fasting blood sugars are running around 120-140. Lab Results  Component Value Date   HGBA1C 7.6 (H) 12/18/2023     4. Hyperlipidemia associated with type 2 diabetes mellitus (HCC) Does not watch diet and does no dedicated exercise. Lab Results  Component Value Date   CHOL 128 12/18/2023   HDL 48 12/18/2023   LDLCALC 64 12/18/2023   TRIG 83 12/18/2023   CHOLHDL 2.7 12/18/2023     5. Obstructive sleep apnea syndrome She doe snot wear cpap at night. Says she sleeps fine and feels rested in mornings.  6. Renal insufficiency Lab Results  Component Value Date   CREATININE 1.41 (H) 12/18/2023     7. OAB (overactive bladder) On no meds forthis right now. Says she feels like she has  to void often. Has to get up at night to void.  8. Depression, major, single episode, mild (HCC) Has been on celexa  for several years. Says she is doing okay.    12/18/2023    8:34 AM 06/20/2023    8:57 AM 01/02/2023   11:27 AM  Depression screen PHQ 2/9  Decreased Interest 0 0 0  Down, Depressed, Hopeless 0 0 2  PHQ - 2 Score 0 0 2  Altered sleeping 0 0 0  Tired, decreased energy 1 0 3  Change in  appetite 0 0 0  Feeling bad or failure about yourself  0 0 0  Trouble concentrating 0 0 0  Moving slowly or fidgety/restless 0 0 0  Suicidal thoughts 0 0 0  PHQ-9 Score 1 0 5  Difficult doing work/chores Not difficult at all Not difficult at all Not difficult at all     9. Class 3 severe obesity due to excess calories with serious comorbidity and body mass index (BMI) greater than or equal to 70 in adult [facility]) No recent weight changes  Wt Readings from Last 3 Encounters:  03/29/24 216 lb (98 kg)  12/18/23 217 lb (98.4 kg)  06/20/23 212 lb (96.2 kg)   BMI Readings from Last 3 Encounters:  03/29/24 33.83 kg/m  12/18/23 33.99 kg/m  06/20/23 33.20 kg/m       New complaints: None today  No Known Allergies Outpatient Encounter Medications as of 03/29/2024  Medication Sig   aspirin  81 MG EC tablet Take 1 tablet (81 mg total) by mouth daily.   calcium  elemental as carbonate (BARIATRIC TUMS ULTRA) 400 MG chewable tablet Chew 1,000 mg by mouth 3 (three) times daily.   Cholecalciferol (VITAMIN D3) 5000 UNITS TABS Take 5,000 Units by mouth daily.   citalopram  (CELEXA ) 40 MG tablet Take 1 tablet (40 mg total) by mouth daily.   Continuous  Glucose Sensor (FREESTYLE LIBRE 2 SENSOR) MISC APPLY EVERY 14 (FOURTEEN) DAYS.   diltiazem  (CARDIZEM  CD) 240 MG 24 hr capsule Take 1 capsule (240 mg total) by mouth daily.   fluticasone  (FLONASE ) 50 MCG/ACT nasal spray SPRAY 2 SPRAYS INTO EACH NOSTRIL EVERY DAY (Patient not taking: Reported on 12/18/2023)   gabapentin  (NEURONTIN ) 400 MG capsule TAKE 1 TO 3 CAPSULES BY MOUTH AT BEDTIME   insulin  NPH Human (HUMULIN N) 100 UNIT/ML injection INJECT 0.4 MLS (40 UNITS TOTAL) INTO THE SKIN 2 (TWO) TIMES DAILY.   insulin  regular (HUMULIN R ) 100 units/mL injection INJECT 10 TO 20 UNITS SUBCUTANEOUSLY THREE TIMES DAILY BEFORE MEAL(S)   Insulin  Syringe-Needle U-100 (BD INSULIN  SYRINGE U/F) 30G X 1/2 0.5 ML MISC INJECT INTO SKIN 5 TIMES DAILY E10.65    lisinopril -hydrochlorothiazide  (ZESTORETIC ) 20-25 MG tablet Take 1 tablet by mouth daily.   metFORMIN  (GLUCOPHAGE ) 1000 MG tablet Take 1 tablet (1,000 mg total) by mouth 2 (two) times daily with a meal.   nystatin  cream (MYCOSTATIN ) Apply 1 Application topically 2 (two) times daily.   ONETOUCH ULTRA test strip TEST TWICE DAILY E11.9   simvastatin  (ZOCOR ) 40 MG tablet Take 1 tablet (40 mg total) by mouth daily.   No facility-administered encounter medications on file as of 03/29/2024.    Past Surgical History:  Procedure Laterality Date   APPENDECTOMY     BREAST CYST EXCISION     benign   CARPAL TUNNEL RELEASE     CHOLECYSTECTOMY     COLONOSCOPY  02/13/2012   EXAMINATION UNDER ANESTHESIA     and sphincterotomy   HAMMER TOE SURGERY     bilateral; 5th toes   LEFT HEART CATH  02/28/2012   Procedure: LEFT HEART CATH;  Surgeon: Debby JONETTA Como, MD;  Location: [facility] CATH LAB;  Service: Cardiovascular;;   LEFT HEART CATHETERIZATION WITH CORONARY ANGIOGRAM N/A 02/28/2012   Procedure: LEFT HEART CATHETERIZATION WITH CORONARY ANGIOGRAM;  Surgeon: Debby JONETTA Como, MD;  Location: [facility] CATH LAB;  Service: Cardiovascular;  Laterality: N/A;   VAGINAL HYSTERECTOMY  1978    Family History  Problem Relation Age of Onset   Heart failure Mother        CHF    COPD Father    Cancer Brother        prostate   Kidney Stones Brother    Epilepsy Daughter    Kidney Stones Daughter    Colon cancer Neg Hx    Stomach cancer Neg Hx       Controlled substance contract: n/a     Review of Systems  Constitutional:  Negative for diaphoresis.  Eyes:  Negative for pain.  Respiratory:  Negative for shortness of breath.   Cardiovascular:  Negative for chest pain, palpitations and leg swelling.  Gastrointestinal:  Negative for abdominal pain.  Endocrine: Negative for polydipsia.  Musculoskeletal:  Positive for back pain.  Skin:  Negative for rash.  Neurological:  Negative for dizziness, weakness and  headaches.  Hematological:  Does not bruise/bleed easily.  All other systems reviewed and are negative.      Objective:   Physical Exam Vitals and nursing note reviewed.  Constitutional:      General: She is not in acute distress.    Appearance: Normal appearance. She is well-developed.  HENT:     Head: Normocephalic.     Right Ear: Tympanic membrane normal.     Left Ear: Tympanic membrane normal.     Nose: Nose normal.  Mouth/Throat:     Mouth: Mucous membranes are moist.  Eyes:     Pupils: Pupils are equal, round, and reactive to light.  Neck:     Vascular: No carotid bruit or JVD.  Cardiovascular:     Rate and Rhythm: Normal rate and regular rhythm.     Heart sounds: Normal heart sounds.  Pulmonary:     Effort: Pulmonary effort is normal. No respiratory distress.     Breath sounds: Normal breath sounds. No wheezing or rales.  Chest:     Chest wall: No tenderness.  Abdominal:     General: Bowel sounds are normal. There is no distension or abdominal bruit.     Palpations: Abdomen is soft. There is no hepatomegaly, splenomegaly, mass or pulsatile mass.     Tenderness: There is no abdominal tenderness.  Musculoskeletal:        General: Normal range of motion.     Cervical back: Normal range of motion and neck supple.     Comments: Rising slowly from sitting to standing FROM of lumbar spine with pain on extension (-) SLR bil Motor strength and sensation distally intact  Lymphadenopathy:     Cervical: No cervical adenopathy.  Skin:    General: Skin is warm and dry.  Neurological:     Mental Status: She is alert and oriented to person, place, and time.     Deep Tendon Reflexes: Reflexes are normal and symmetric.  Psychiatric:        Behavior: Behavior normal.        Thought Content: Thought content normal.        Judgment: Judgment normal.     BP 136/74   Pulse 92   Temp 98 F (36.7 C) (Temporal)   Ht 5' 7 (1.702 m)   Wt 216 lb (98 kg)   SpO2 95%   BMI  33.83 kg/m    Hgba1c 7.8%     Assessment & Plan:   ZOE CREASMAN comes in today with chief complaint of medical management of chronic issues    Diagnosis and orders addressed:  1. Primary hypertension Low sodium diet - CBC with Differential/Platelet - CMP14+EGFR - diltiazem  (CARDIZEM  CD) 240 MG 24 hr capsule; Take 1 capsule (240 mg total) by mouth daily.  Dispense: 90 capsule; Refill: 1 - lisinopril -hydrochlorothiazide  (ZESTORETIC ) 20-25 MG tablet; Take 1 tablet by mouth daily.  Dispense: 90 tablet; Refill: 1  2. Diverticulosis Watch diet  to prevent flare up  3. Diabetes mellitus treated with insulin  and oral medication (HCC) Stricter carb counting - Bayer DCA Hb A1c Waived - gabapentin  (NEURONTIN ) 400 MG capsule; TAKE 1 TO 3 CAPSULES BY MOUTH AT BEDTIME  Dispense: 270 capsule; Refill: 1 - insulin  NPH Human (HUMULIN N) 100 UNIT/ML injection; INJECT 0.4 MLS (40 UNITS TOTAL) INTO THE SKIN 2 (TWO) TIMES DAILY.  Dispense: 70 mL; Refill: 5 - insulin  regular (HUMULIN R ) 100 units/mL injection; INJECT 10 TO 20 UNITS SUBCUTANEOUSLY THREE TIMES DAILY BEFORE MEAL(S)  Dispense: 30 mL; Refill: 5 - metFORMIN  (GLUCOPHAGE ) 1000 MG tablet; Take 1 tablet (1,000 mg total) by mouth 2 (two) times daily with a meal.  Dispense: 180 tablet; Refill: 1  4. Hyperlipidemia associated with type 2 diabetes mellitus (HCC) Low fat diet - Lipid panel - simvastatin  (ZOCOR ) 40 MG tablet; Take 1 tablet (40 mg total) by mouth daily.  Dispense: 90 tablet; Refill: 1  5. Obstructive sleep apnea syndrome Continue to wear cpap  6. Renal insufficiency  7. OAB (  overactive bladder)  8. Depression, major, single episode, mild (HCC) Stress management - citalopram  (CELEXA ) 40 MG tablet; Take 1 tablet (40 mg total) by mouth daily.  Dispense: 90 tablet; Refill: 1  9. Class 3 severe obesity due to excess calories with serious comorbidity and body mass index (BMI) greater than or equal to 70 in adult  [facility]) Discussed diet and exercise for person with BMI >25 Will recheck weight in 3-6 months   Labs pending Health Maintenance reviewed Diet and exercise encouraged  Follow up plan: 3 months   Mary-Margaret Gladis, FNP

## 2024-03-29 NOTE — Patient Instructions (Signed)

## 2024-03-30 ENCOUNTER — Ambulatory Visit: Payer: Self-pay | Admitting: Nurse Practitioner

## 2024-03-30 LAB — CMP14+EGFR
ALT: 15 IU/L (ref 0–32)
AST: 20 IU/L (ref 0–40)
Albumin: 4.1 g/dL (ref 3.7–4.7)
Alkaline Phosphatase: 68 IU/L (ref 44–121)
BUN/Creatinine Ratio: 17 (ref 12–28)
BUN: 21 mg/dL (ref 8–27)
Bilirubin Total: 0.3 mg/dL (ref 0.0–1.2)
CO2: 19 mmol/L — AB (ref 20–29)
Calcium: 9.1 mg/dL (ref 8.7–10.3)
Chloride: 102 mmol/L (ref 96–106)
Creatinine, Ser: 1.27 mg/dL — AB (ref 0.57–1.00)
Globulin, Total: 2.4 g/dL (ref 1.5–4.5)
Glucose: 193 mg/dL — AB (ref 70–99)
Potassium: 5.3 mmol/L — AB (ref 3.5–5.2)
Sodium: 138 mmol/L (ref 134–144)
Total Protein: 6.5 g/dL (ref 6.0–8.5)
eGFR: 42 mL/min/1.73 — AB (ref >59)

## 2024-03-30 LAB — LIPID PANEL
Cholesterol, Total: 125 mg/dL (ref 100–199)
HDL: 42 mg/dL (ref >39)
LDL CALC COMMENT:: 3 ratio (ref 0.0–4.4)
LDL Chol Calc (NIH): 59 mg/dL (ref 0–99)
Triglycerides: 136 mg/dL (ref 0–149)
VLDL Cholesterol Cal: 24 mg/dL (ref 5–40)

## 2024-03-30 LAB — CBC WITH DIFFERENTIAL/PLATELET
Basophils Absolute: 0.1 x10E3/uL (ref 0.0–0.2)
Basos: 1 %
EOS (ABSOLUTE): 0.1 x10E3/uL (ref 0.0–0.4)
Eos: 2 %
Hematocrit: 39.3 % (ref 34.0–46.6)
Hemoglobin: 12 g/dL (ref 11.1–15.9)
Immature Grans (Abs): 0 x10E3/uL (ref 0.0–0.1)
Immature Granulocytes: 0 %
Lymphocytes Absolute: 2.3 x10E3/uL (ref 0.7–3.1)
Lymphs: 26 %
MCH: 29.1 pg (ref 26.6–33.0)
MCHC: 30.5 g/dL — ABNORMAL LOW (ref 31.5–35.7)
MCV: 95 fL (ref 79–97)
Monocytes Absolute: 0.7 x10E3/uL (ref 0.1–0.9)
Monocytes: 8 %
Neutrophils Absolute: 5.5 x10E3/uL (ref 1.4–7.0)
Neutrophils: 63 %
Platelets: 340 x10E3/uL (ref 150–450)
RBC: 4.12 x10E6/uL (ref 3.77–5.28)
RDW: 13.1 % (ref 11.7–15.4)
WBC: 8.7 x10E3/uL (ref 3.4–10.8)

## 2024-03-30 LAB — VITAMIN B12: Vitamin B-12: 142 pg/mL — AB (ref 232–1245)

## 2024-04-01 ENCOUNTER — Ambulatory Visit

## 2024-04-01 VITALS — BP 136/74 | HR 92 | Ht 67.0 in | Wt 216.0 lb

## 2024-04-01 DIAGNOSIS — Z0000 Encounter for general adult medical examination without abnormal findings: Secondary | ICD-10-CM

## 2024-04-01 NOTE — Progress Notes (Addendum)
 Subjective:   Adrienne Cook is a 83 y.o. who presents for a Medicare Wellness preventive visit.  As a reminder, Annual Wellness Visits don't include a physical exam, and some assessments may be limited, especially if this visit is performed virtually. We may recommend an in-person follow-up visit with your provider if needed.  Visit Complete: Virtual I connected with  Adrienne Cook on 04/01/24 by a audio enabled telemedicine application and verified that I am speaking with the correct person using two identifiers.  Patient Location: Home  Provider Location: Home Office  I discussed the limitations of evaluation and management by telemedicine. The patient expressed understanding and agreed to proceed.  Vital Signs: Because this visit was a virtual/telehealth visit, some criteria may be missing or patient reported. Any vitals not documented were not able to be obtained and vitals that have been documented are patient reported.  VideoDeclined- This patient declined Librarian, academic. Therefore the visit was completed with audio only.  Persons Participating in Visit: Patient.  AWV Questionnaire: No: Patient Medicare AWV questionnaire was not completed prior to this visit.  Cardiac Risk Factors include: advanced age (>6men, >19 women);obesity (BMI >30kg/m2);diabetes mellitus;dyslipidemia;hypertension     Objective:    Today's Vitals   04/01/24 1338  BP: 136/74  Pulse: 92  Weight: 216 lb (98 kg)  Height: 5' 7 (1.702 m)   Body mass index is 33.83 kg/m.     04/01/2024    1:42 PM 11/19/2022    3:25 PM 11/16/2021    2:45 PM 11/10/2020    3:42 PM 07/01/2019    8:35 AM 12/25/2016   11:48 AM 10/27/2014    9:47 PM  Advanced Directives  Does Patient Have a Medical Advance Directive? No No No No No No  No   Would patient like information on creating a medical advance directive?  No - Patient declined No - Patient declined No - Patient declined No -  Patient declined  No - patient declined information      Data saved with a previous flowsheet row definition    Current Medications (verified) Outpatient Encounter Medications as of 04/01/2024  Medication Sig   aspirin  81 MG EC tablet Take 1 tablet (81 mg total) by mouth daily.   calcium  elemental as carbonate (BARIATRIC TUMS ULTRA) 400 MG chewable tablet Chew 1,000 mg by mouth 3 (three) times daily.   Cholecalciferol (VITAMIN D3) 5000 UNITS TABS Take 5,000 Units by mouth daily.   citalopram  (CELEXA ) 40 MG tablet Take 1 tablet (40 mg total) by mouth daily.   Continuous Glucose Sensor (FREESTYLE LIBRE 2 SENSOR) MISC APPLY EVERY 14 (FOURTEEN) DAYS.   cyanocobalamin  (VITAMIN B12) 1000 MCG tablet Take 1,000 mcg by mouth daily.   diltiazem  (CARDIZEM  CD) 240 MG 24 hr capsule Take 1 capsule (240 mg total) by mouth daily.   fluticasone  (FLONASE ) 50 MCG/ACT nasal spray SPRAY 2 SPRAYS INTO EACH NOSTRIL EVERY DAY   gabapentin  (NEURONTIN ) 400 MG capsule TAKE 1 TO 3 CAPSULES BY MOUTH AT BEDTIME   insulin  NPH Human (HUMULIN N) 100 UNIT/ML injection INJECT 0.4 MLS (40 UNITS TOTAL) INTO THE SKIN 2 (TWO) TIMES DAILY.   insulin  regular (HUMULIN R ) 100 units/mL injection INJECT 10 TO 20 UNITS SUBCUTANEOUSLY THREE TIMES DAILY BEFORE MEAL(S)   Insulin  Syringe-Needle U-100 (B-D INS SYR ULTRAFINE .5CC/30G) 30G X 1/2 0.5 ML MISC INJECT INTO SKIN 5 TIMES DAILY E10.65   lisinopril -hydrochlorothiazide  (ZESTORETIC ) 20-25 MG tablet Take 1 tablet by mouth daily.  metFORMIN  (GLUCOPHAGE ) 1000 MG tablet Take 1 tablet (1,000 mg total) by mouth 2 (two) times daily with a meal.   nystatin  cream (MYCOSTATIN ) Apply 1 Application topically 2 (two) times daily.   ONETOUCH ULTRA test strip TEST TWICE DAILY E11.9   simvastatin  (ZOCOR ) 40 MG tablet Take 1 tablet (40 mg total) by mouth daily.   No facility-administered encounter medications on file as of 04/01/2024.    Allergies (verified) Patient has no known allergies.    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 normal event monitor   Obesity    PONV (postoperative nausea and vomiting)    Seasonal allergies    Sleep apnea    does not tolerate CPAP   Past Surgical History:  Procedure Laterality Date   APPENDECTOMY     BREAST CYST EXCISION     benign   CARPAL TUNNEL RELEASE     CHOLECYSTECTOMY     COLONOSCOPY  02/13/2012   EXAMINATION UNDER ANESTHESIA     and sphincterotomy   HAMMER TOE SURGERY     bilateral; 5th toes   LEFT HEART CATH  02/28/2012   Procedure: LEFT HEART CATH;  Surgeon: Debby JONETTA Como, MD;  Location: [facility] CATH LAB;  Service: Cardiovascular;;   LEFT HEART CATHETERIZATION WITH CORONARY ANGIOGRAM N/A 02/28/2012   Procedure: LEFT HEART CATHETERIZATION WITH CORONARY ANGIOGRAM;  Surgeon: Debby JONETTA Como, MD;  Location: [facility] CATH LAB;  Service: Cardiovascular;  Laterality: N/A;   VAGINAL HYSTERECTOMY  1978   Family History  Problem Relation Age of Onset   Heart failure Mother        CHF    COPD Father    Cancer Brother        prostate   Kidney Stones Brother    Epilepsy Daughter    Kidney Stones Daughter    Colon cancer Neg Hx    Stomach cancer Neg Hx    Social History   Socioeconomic History   Marital status: Legally Separated    Spouse name: Not on file   Number of children: 1   Years of education: 12   Highest education level: 12th grade  Occupational History   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  Tobacco Use   Smoking status: Never   Smokeless tobacco: Never   Tobacco comments:    tobacco use - no  Vaping Use   Vaping status: Never Used  Substance and Sexual Activity   Alcohol use: No   Drug use: No   Sexual activity: Not Currently    Birth control/protection: Surgical    Comment: hyst  Other Topics Concern   Not on file  Social History  Narrative   Lives with daughter    Social Drivers of Health   Financial Resource Strain: Low Risk  (04/01/2024)   Overall Financial Resource Strain (CARDIA)    Difficulty of Paying Living Expenses: Not hard at all  Food Insecurity: No Food Insecurity (04/01/2024)   Hunger Vital Sign    Worried About Running Out of Food in the Last Year: Never true    Ran Out of Food in the Last Year: Never true  Transportation Needs: No Transportation Needs (04/01/2024)   PRAPARE - Administrator, Civil Service (Medical): No    Lack of Transportation (Non-Medical): No  Physical Activity: Insufficiently Active (04/01/2024)   Exercise Vital Sign    Days of Exercise per Week: 5 days    Minutes of Exercise per Session: 10 min  Stress: No Stress Concern Present (04/01/2024)   [employer] of Occupational Health - Occupational Stress Questionnaire    Feeling of Stress: Not at all  Social Connections: Moderately Isolated (04/01/2024)   Social Connection and Isolation Panel    Frequency of Communication with Friends and Family: More than three times a week    Frequency of Social Gatherings with Friends and Family: More than three times a week    Attends Religious Services: Never    Database administrator or Organizations: No    Attends Engineer, structural: More than 4 times per year    Marital Status: Widowed    Tobacco Counseling Counseling given: Yes Tobacco comments: tobacco use - no    Clinical Intake:  Pre-visit preparation completed: Yes  Pain : No/denies pain     BMI - recorded: 33.83 Nutritional Status: BMI > 30  Obese Nutritional Risks: None Diabetes: Yes CBG done?: No  Lab Results  Component Value Date   HGBA1C 7.8 (H) 03/29/2024   HGBA1C 7.6 (H) 12/18/2023   HGBA1C 6.9 (H) 06/20/2023     How often do you need to have someone help you when you read instructions, pamphlets, or other written materials from your doctor or pharmacy?: 1 -  Never  Interpreter Needed?: No  Information entered by :: alia t/cma   Activities of Daily Living     04/01/2024    1:42 PM  In your present state of health, do you have any difficulty performing the following activities:  Hearing? 0  Vision? 0  Difficulty concentrating or making decisions? 0  Walking or climbing stairs? 0  Dressing or bathing? 0  Doing errands, shopping? 0  Preparing Food and eating ? N  Using the Toilet? N  In the past six months, have you accidently leaked urine? Y  Do you have problems with loss of bowel control? N  Managing your Medications? N  Managing your Finances? N  Housekeeping or managing your Housekeeping? N    Patient Care Team: Gladis Mustard, FNP as PCP - General (Family Medicine)  I have updated your Care Teams any recent Medical Services you may have received from other providers in the past year.     Assessment:   This is a routine wellness examination for [place].  Hearing/Vision screen Hearing Screening - Comments:: Pt denies hearing dif Vision Screening - Comments:: Pt denies vision dif/pt goes to [facility] in [place]/last ov 1/25   Goals Addressed   None    Depression Screen     04/01/2024    1:49 PM 03/29/2024    9:08 AM 12/18/2023    8:34 AM 06/20/2023    8:57 AM 01/02/2023   11:27 AM 11/19/2022    3:23 PM 10/03/2022   10:24 AM  PHQ 2/9 Scores  PHQ - 2 Score 0 0 0 0 2 0 2  PHQ- 9 Score 0 1 1 0 5 0 4    Fall Risk     04/01/2024    1:39 PM 03/29/2024    9:07 AM 12/18/2023    8:34 AM 06/20/2023    8:57 AM 01/02/2023   11:27 AM  Fall Risk   Falls in the past year? 0 0 1 0 0  Number falls in past yr: 0  0    Injury  with Fall? 0  1    Risk for fall due to : No Fall Risks  History of fall(s)    Follow up Falls evaluation completed  Education provided      MEDICARE RISK AT HOME:  Medicare Risk at Home Any stairs in or around the home?: Yes If so, are there any without handrails?: Yes Home free of loose throw rugs in  walkways, pet beds, electrical cords, etc?: Yes Adequate lighting in your home to reduce risk of falls?: Yes Life alert?: No Use of a cane, walker or w/c?: No Grab bars in the bathroom?: No Shower chair or bench in shower?: Yes Elevated toilet seat or a handicapped toilet?: Yes  TIMED UP AND GO:  Was the test performed?  no  Cognitive Function: 6CIT completed    06/29/2018   10:02 AM  MMSE - Mini Mental State Exam  Orientation to time 5  Orientation to Place 5  Registration 3  Attention/ Calculation 5  Recall 3  Language- name 2 objects 2  Language- repeat 1  Language- follow 3 step command 3  Language- read & follow direction 1  Write a sentence 1  Copy design 1  Total score 30        04/01/2024    1:52 PM 04/01/2024    1:42 PM 11/19/2022    3:25 PM 11/16/2021    2:50 PM 11/10/2020    3:45 PM  6CIT Screen  What Year? 0 points 0 points 0 points 0 points 0 points  What month? 0 points 0 points 0 points 0 points 0 points  What time? 0 points 0 points 0 points 0 points 0 points  Count back from 20 0 points 0 points 0 points 0 points 0 points  Months in reverse 0 points 0 points 0 points 0 points 0 points  Repeat phrase 0 points 0 points 0 points 0 points   Total Score 0 points 0 points 0 points 0 points     Immunizations Immunization History  Administered Date(s) Administered   Fluad Quad(high Dose 65+) 06/15/2019, 07/06/2021   Fluad Trivalent(High Dose 65+) 06/20/2023   Influenza, High Dose Seasonal PF 06/29/2016, 07/07/2017, 06/29/2018   Influenza-Unspecified 07/10/2020, 07/10/2020, 06/16/2022   Moderna Sars-Covid-2 Vaccination 11/01/2019, 11/29/2019, 09/13/2020, 02/23/2021   Pneumococcal Conjugate-13 10/09/2015   Pneumococcal Polysaccharide-23 07/10/2012   Tdap 06/29/2018   Zoster Recombinant(Shingrix ) 09/27/2021, 01/01/2022    Screening Tests Health Maintenance  Topic Date Due   OPHTHALMOLOGY EXAM  11/09/2023   COVID-19 Vaccine (5 - 2024-25 season)  04/14/2024 (Originally 05/18/2023)   INFLUENZA VACCINE  04/16/2024   HEMOGLOBIN A1C  09/29/2024   Diabetic kidney evaluation - Urine ACR  12/17/2024   Diabetic kidney evaluation - eGFR measurement  03/29/2025   FOOT EXAM  03/29/2025   Medicare Annual Wellness (AWV)  04/01/2025   MAMMOGRAM  05/13/2025   DEXA SCAN  06/19/2025   DTaP/Tdap/Td (2 - Td or Tdap) 06/29/2028   Pneumococcal Vaccine: 50+ Years  Completed   Zoster Vaccines- Shingrix   Completed   Hepatitis B Vaccines  Aged Out   HPV VACCINES  Aged Out   Meningococcal B Vaccine  Aged Out    Health Maintenance  Health Maintenance Due  Topic Date Due   OPHTHALMOLOGY EXAM  11/09/2023   Health Maintenance Items Addressed: See Nurse Notes at the end of this note  Additional Screening:  Vision Screening: Recommended annual ophthalmology exams for early detection of glaucoma and other disorders of the eye. Would  you like a referral to an eye doctor? No    Dental Screening: Recommended annual dental exams for proper oral hygiene  Community Resource Referral / Chronic Care Management: CRR required this visit?  No   CCM required this visit?  No   Plan:    I have personally reviewed and noted the following in the patient's chart:   Medical and social history Use of alcohol, tobacco or illicit drugs  Current medications and supplements including opioid prescriptions. Patient is not currently taking opioid prescriptions. Functional ability and status Nutritional status Physical activity Advanced directives List of other physicians Hospitalizations, surgeries, and ER visits in previous 12 months Vitals Screenings to include cognitive, depression, and falls Referrals and appointments  In addition, I have reviewed and discussed with patient certain preventive protocols, quality metrics, and best practice recommendations. A written personalized care plan for preventive services as well as general preventive health  recommendations were provided to patient.   Ozie Ned, CMA   04/01/2024   After Visit Summary: (MyChart) Due to this being a telephonic visit, the after visit summary with patients personalized plan was offered to patient via MyChart   Notes: Nothing significant to report at this time.  I have reviewed and agree with the above AWV documentation.   Mary-Margaret Gladis, FNP

## 2024-04-01 NOTE — Patient Instructions (Signed)
 Adrienne Cook , Thank you for taking time out of your busy schedule to complete your Annual Wellness Visit with me. I enjoyed our conversation and look forward to speaking with you again next year. I, as well as your care team,  appreciate your ongoing commitment to your health goals. Please review the following plan we discussed and let me know if I can assist you in the future. Your Game plan/ To Do List    Follow up Visits: Next Medicare AWV with our clinical staff: 04/04/25 at 2:30p.m.   Have you seen your provider in the last 6 months (3 months if uncontrolled diabetes)? Yes Next Office Visit with your provider: 07/01/24 at 8:30a.m.  Clinician Recommendations:  Aim for 30 minutes of exercise or brisk walking, 6-8 glasses of water, and 5 servings of fruits and vegetables each day.       This is a list of the screening recommended for you and due dates:  Health Maintenance  Topic Date Due   Eye exam for diabetics  11/09/2023   Medicare Annual Wellness Visit  11/19/2023   COVID-19 Vaccine (5 - 2024-25 season) 04/14/2024*   Flu Shot  04/16/2024   Hemoglobin A1C  09/29/2024   Yearly kidney health urinalysis for diabetes  12/17/2024   Yearly kidney function blood test for diabetes  03/29/2025   Complete foot exam   03/29/2025   Mammogram  05/13/2025   DEXA scan (bone density measurement)  06/19/2025   DTaP/Tdap/Td vaccine (2 - Td or Tdap) 06/29/2028   Pneumococcal Vaccine for age over 59  Completed   Zoster (Shingles) Vaccine  Completed   Hepatitis B Vaccine  Aged Out   HPV Vaccine  Aged Out   Meningitis B Vaccine  Aged Out  *Topic was postponed. The date shown is not the original due date.    Advanced directives: (Declined) Advance directive discussed with you today. Even though you declined this today, please call our office should you change your mind, and we can give you the proper paperwork for you to fill out. Advance Care Planning is important because it:  [x]  Makes sure you  receive the medical care that is consistent with your values, goals, and preferences  [x]  It provides guidance to your family and loved ones and reduces their decisional burden about whether or not they are making the right decisions based on your wishes.  Follow the link provided in your after visit summary or read over the paperwork we have mailed to you to help you started getting your Advance Directives in place. If you need assistance in completing these, please reach out to us  so that we can help you!  See attachments for Preventive Care and Fall Prevention Tips.

## 2024-04-13 ENCOUNTER — Other Ambulatory Visit: Payer: Self-pay

## 2024-04-13 DIAGNOSIS — E875 Hyperkalemia: Secondary | ICD-10-CM

## 2024-04-21 ENCOUNTER — Other Ambulatory Visit

## 2024-04-21 DIAGNOSIS — E875 Hyperkalemia: Secondary | ICD-10-CM

## 2024-04-22 ENCOUNTER — Ambulatory Visit: Payer: Self-pay | Admitting: Nurse Practitioner

## 2024-04-22 LAB — CMP14+EGFR
ALT: 15 IU/L (ref 0–32)
AST: 22 IU/L (ref 0–40)
Albumin: 4.3 g/dL (ref 3.7–4.7)
Alkaline Phosphatase: 70 IU/L (ref 44–121)
BUN/Creatinine Ratio: 25 (ref 12–28)
BUN: 32 mg/dL — ABNORMAL HIGH (ref 8–27)
Bilirubin Total: 0.3 mg/dL (ref 0.0–1.2)
CO2: 21 mmol/L (ref 20–29)
Calcium: 9.7 mg/dL (ref 8.7–10.3)
Chloride: 103 mmol/L (ref 96–106)
Creatinine, Ser: 1.3 mg/dL — ABNORMAL HIGH (ref 0.57–1.00)
Globulin, Total: 2.5 g/dL (ref 1.5–4.5)
Glucose: 128 mg/dL — ABNORMAL HIGH (ref 70–99)
Potassium: 5.2 mmol/L (ref 3.5–5.2)
Sodium: 140 mmol/L (ref 134–144)
Total Protein: 6.8 g/dL (ref 6.0–8.5)
eGFR: 41 mL/min/1.73 — ABNORMAL LOW (ref >59)

## 2024-06-28 ENCOUNTER — Ambulatory Visit
Admission: RE | Admit: 2024-06-28 | Discharge: 2024-06-28 | Disposition: A | Source: Ambulatory Visit | Attending: Nurse Practitioner | Admitting: Nurse Practitioner

## 2024-06-28 ENCOUNTER — Other Ambulatory Visit: Payer: Self-pay | Admitting: Nurse Practitioner

## 2024-06-28 DIAGNOSIS — Z1231 Encounter for screening mammogram for malignant neoplasm of breast: Secondary | ICD-10-CM

## 2024-07-01 ENCOUNTER — Encounter: Payer: Self-pay | Admitting: Nurse Practitioner

## 2024-07-01 ENCOUNTER — Other Ambulatory Visit: Payer: Self-pay | Admitting: Nurse Practitioner

## 2024-07-01 ENCOUNTER — Ambulatory Visit: Admitting: Nurse Practitioner

## 2024-07-01 VITALS — BP 160/70 | HR 77 | Temp 97.1°F | Ht 67.0 in | Wt 218.0 lb

## 2024-07-01 DIAGNOSIS — F330 Major depressive disorder, recurrent, mild: Secondary | ICD-10-CM

## 2024-07-01 DIAGNOSIS — E1121 Type 2 diabetes mellitus with diabetic nephropathy: Secondary | ICD-10-CM

## 2024-07-01 DIAGNOSIS — K591 Functional diarrhea: Secondary | ICD-10-CM

## 2024-07-01 DIAGNOSIS — I10 Essential (primary) hypertension: Secondary | ICD-10-CM | POA: Diagnosis not present

## 2024-07-01 DIAGNOSIS — N1832 Chronic kidney disease, stage 3b: Secondary | ICD-10-CM | POA: Diagnosis not present

## 2024-07-01 DIAGNOSIS — K5790 Diverticulosis of intestine, part unspecified, without perforation or abscess without bleeding: Secondary | ICD-10-CM | POA: Diagnosis not present

## 2024-07-01 DIAGNOSIS — E1169 Type 2 diabetes mellitus with other specified complication: Secondary | ICD-10-CM | POA: Diagnosis not present

## 2024-07-01 DIAGNOSIS — N3281 Overactive bladder: Secondary | ICD-10-CM | POA: Diagnosis not present

## 2024-07-01 DIAGNOSIS — E785 Hyperlipidemia, unspecified: Secondary | ICD-10-CM | POA: Diagnosis not present

## 2024-07-01 DIAGNOSIS — G4733 Obstructive sleep apnea (adult) (pediatric): Secondary | ICD-10-CM | POA: Diagnosis not present

## 2024-07-01 DIAGNOSIS — M5442 Lumbago with sciatica, left side: Secondary | ICD-10-CM

## 2024-07-01 DIAGNOSIS — G8929 Other chronic pain: Secondary | ICD-10-CM

## 2024-07-01 DIAGNOSIS — Z23 Encounter for immunization: Secondary | ICD-10-CM | POA: Diagnosis not present

## 2024-07-01 DIAGNOSIS — N3946 Mixed incontinence: Secondary | ICD-10-CM

## 2024-07-01 LAB — BAYER DCA HB A1C WAIVED: HB A1C (BAYER DCA - WAIVED): 7.2 % — ABNORMAL HIGH (ref 4.8–5.6)

## 2024-07-01 MED ORDER — INSULIN NPH (HUMAN) (ISOPHANE) 100 UNIT/ML ~~LOC~~ SUSP: 50.0000 [IU] | Freq: Two times a day (BID) | SUBCUTANEOUS | 5 refills | Status: DC

## 2024-07-01 MED ORDER — MELOXICAM 15 MG PO TABS: 15.0000 mg | ORAL_TABLET | Freq: Every day | ORAL | 0 refills | Status: DC

## 2024-07-01 MED ORDER — TOLTERODINE TARTRATE ER 4 MG PO CP24: 4.0000 mg | ORAL_CAPSULE | Freq: Every day | ORAL | 1 refills | Status: DC

## 2024-07-01 NOTE — Patient Instructions (Signed)
 Back Exercises These exercises help to make your trunk and back strong. They also help to keep the lower back flexible. Doing these exercises can help to prevent or lessen pain in your lower back. If you have back pain, try to do these exercises 2-3 times each day or as told by your doctor. As you get better, do the exercises once each day. Repeat the exercises more often as told by your doctor. To stop back pain from coming back, do the exercises once each day, or as told by your doctor. Do exercises exactly as told by your doctor. Stop right away if you feel sudden pain or your pain gets worse. Exercises Single knee to chest Do these steps 3-5 times in a row for each leg: Lie on your back on a firm bed or the floor with your legs stretched out. Bring one knee to your chest. Grab your knee or thigh with both hands and hold it in place. Pull on your knee until you feel a gentle stretch in your lower back or butt. Keep doing the stretch for 10-30 seconds. Slowly let go of your leg and straighten it. Pelvic tilt Do these steps 5-10 times in a row: Lie on your back on a firm bed or the floor with your legs stretched out. Bend your knees so they point up to the ceiling. Your feet should be flat on the floor. Tighten your lower belly (abdomen) muscles to press your lower back against the floor. This will make your tailbone point up to the ceiling instead of pointing down to your feet or the floor. Stay in this position for 5-10 seconds while you gently tighten your muscles and breathe evenly. Cat-cow Do these steps until your lower back bends more easily: Get on your hands and knees on a firm bed or the floor. Keep your hands under your shoulders, and keep your knees under your hips. You may put padding under your knees. Let your head hang down toward your chest. Tighten (contract) the muscles in your belly. Point your tailbone toward the floor so your lower back becomes rounded like the back of a  cat. Stay in this position for 5 seconds. Slowly lift your head. Let the muscles of your belly relax. Point your tailbone up toward the ceiling so your back forms a sagging arch like the back of a cow. Stay in this position for 5 seconds.  Press-ups Do these steps 5-10 times in a row: Lie on your belly (face-down) on a firm bed or the floor. Place your hands near your head, about shoulder-width apart. While you keep your back relaxed and keep your hips on the floor, slowly straighten your arms to raise the top half of your body and lift your shoulders. Do not use your back muscles. You may change where you place your hands to make yourself more comfortable. Stay in this position for 5 seconds. Keep your back relaxed. Slowly return to lying flat on the floor.  Bridges Do these steps 10 times in a row: Lie on your back on a firm bed or the floor. Bend your knees so they point up to the ceiling. Your feet should be flat on the floor. Your arms should be flat at your sides, next to your body. Tighten your butt muscles and lift your butt off the floor until your waist is almost as high as your knees. If you do not feel the muscles working in your butt and the back of  your thighs, slide your feet 1-2 inches (2.5-5 cm) farther away from your butt. Stay in this position for 3-5 seconds. Slowly lower your butt to the floor, and let your butt muscles relax. If this exercise is too easy, try doing it with your arms crossed over your chest. Belly crunches Do these steps 5-10 times in a row: Lie on your back on a firm bed or the floor with your legs stretched out. Bend your knees so they point up to the ceiling. Your feet should be flat on the floor. Cross your arms over your chest. Tip your chin a little bit toward your chest, but do not bend your neck. Tighten your belly muscles and slowly raise your chest just enough to lift your shoulder blades a tiny bit off the floor. Avoid raising your body  higher than that because it can put too much stress on your lower back. Slowly lower your chest and your head to the floor. Back lifts Do these steps 5-10 times in a row: Lie on your belly (face-down) with your arms at your sides, and rest your forehead on the floor. Tighten the muscles in your legs and your butt. Slowly lift your chest off the floor while you keep your hips on the floor. Keep the back of your head in line with the curve in your back. Look at the floor while you do this. Stay in this position for 3-5 seconds. Slowly lower your chest and your face to the floor. Contact a doctor if: Your back pain gets a lot worse when you do an exercise. Your back pain does not get better within 2 hours after you exercise. If you have any of these problems, stop doing the exercises. Do not do them again unless your doctor says it is okay. Get help right away if: You have sudden, very bad back pain. If this happens, stop doing the exercises. Do not do them again unless your doctor says it is okay. This information is not intended to replace advice given to you by your health care provider. Make sure you discuss any questions you have with your health care provider. Document Revised: 11/15/2020 Document Reviewed: 11/15/2020 Elsevier Patient Education  2024 [employer].

## 2024-07-01 NOTE — Progress Notes (Signed)
 Subjective:    Patient ID: Adrienne Cook, female    DOB: [birth year], 83 y.o.   MRN: [id number]   Chief Complaint: medical management of chronic issues     HPI:  Adrienne Cook is a 83 y.o. who identifies as a female who was assigned female at birth.   Social history: Lives with: daughter and grand son Work history: retired   Water engineer in today for follow up of the following chronic medical issues:  1. Primary hypertension No c/o chest pain, sob or headache. Does not check blood pressure at home. BP Readings from Last 3 Encounters:  04/01/24 136/74  03/29/24 136/74  12/18/23 (!) 126/50     2. Diverticulosis Denies any recent flare ups. Denies abdominal pain  3. Diabetes mellitus treated with neuropathy (HCC) Fasting blood sugars are running around- 140-170. She has not been watching her diet Lab Results  Component Value Date   HGBA1C 7.8 (H) 03/29/2024     4. Hyperlipidemia associated with type 2 diabetes mellitus (HCC) Does not watch diet and does no dedicated exercise. Lab Results  Component Value Date   CHOL 125 03/29/2024   HDL 42 03/29/2024   LDLCALC 59 03/29/2024   TRIG 136 03/29/2024   CHOLHDL 3.0 03/29/2024     5. Obstructive sleep apnea syndrome She does not wear cpap at night. Says she sleeps fine and feels rested in mornings.  6. CKD 3B Last egfr 41 Lab Results  Component Value Date   CREATININE 1.30 (H) 04/21/2024     7. OAB (overactive bladder) On no meds forthis right now. Says she feels like she has  to void often. Has to get up at night to void.  8. Depression, major, single episode, mild (HCC) Has been on celexa  for several years. Says she is doing okay.    07/01/2024    8:36 AM 04/01/2024    1:49 PM 03/29/2024    9:08 AM  Depression screen PHQ 2/9  Decreased Interest 0 0 0  Down, Depressed, Hopeless 0 0 0  PHQ - 2 Score 0 0 0  Altered sleeping  0 0  Tired, decreased energy  0 1  Change in appetite  0 0  Feeling bad or  failure about yourself   0 0  Trouble concentrating  0 0  Moving slowly or fidgety/restless  0 0  Suicidal thoughts  0 0  PHQ-9 Score  0 1  Difficult doing work/chores  Not difficult at all Not difficult at all      9. Class 3 severe obesity due to excess calories with serious comorbidity and body mass index (BMI) greater than or equal to 70 in adult [facility]) No recent weight changes  BMI Readings from Last 3 Encounters:  07/01/24 34.14 kg/m  04/01/24 33.83 kg/m  03/29/24 33.83 kg/m     Wt Readings from Last 3 Encounters:  07/01/24 218 lb (98.9 kg)  04/01/24 216 lb (98 kg)  03/29/24 216 lb (98 kg)         New complaints: - urinary incontinence- occurs 2-3 x a day. - diarrhea daily- happens after eating. Doe snot go out because has to go to the bathroom so frequently - abdominal pain after eating- lasts several hours.  - Chronic low back pain that radiates into left hip. Occurs daily. Started several months ago. Rates pain 8/10 intermittently. Activity increases pain, takes tylenol  which does help.  No Known Allergies Outpatient Encounter Medications as of 07/01/2024  Medication Sig  aspirin  81 MG EC tablet Take 1 tablet (81 mg total) by mouth daily.   calcium  elemental as carbonate (BARIATRIC TUMS ULTRA) 400 MG chewable tablet Chew 1,000 mg by mouth 3 (three) times daily.   Cholecalciferol (VITAMIN D3) 5000 UNITS TABS Take 5,000 Units by mouth daily.   citalopram  (CELEXA ) 40 MG tablet Take 1 tablet (40 mg total) by mouth daily.   Continuous Glucose Sensor (FREESTYLE LIBRE 2 SENSOR) MISC APPLY EVERY 14 (FOURTEEN) DAYS.   cyanocobalamin  (VITAMIN B12) 1000 MCG tablet Take 1,000 mcg by mouth daily.   diltiazem  (CARDIZEM  CD) 240 MG 24 hr capsule Take 1 capsule (240 mg total) by mouth daily.   fluticasone  (FLONASE ) 50 MCG/ACT nasal spray SPRAY 2 SPRAYS INTO EACH NOSTRIL EVERY DAY   gabapentin  (NEURONTIN ) 400 MG capsule TAKE 1 TO 3 CAPSULES BY MOUTH AT BEDTIME   insulin  NPH  Human (HUMULIN N) 100 UNIT/ML injection INJECT 0.4 MLS (40 UNITS TOTAL) INTO THE SKIN 2 (TWO) TIMES DAILY.   insulin  regular (HUMULIN R ) 100 units/mL injection INJECT 10 TO 20 UNITS SUBCUTANEOUSLY THREE TIMES DAILY BEFORE MEAL(S)   Insulin  Syringe-Needle U-100 (B-D INS SYR ULTRAFINE .5CC/30G) 30G X 1/2 0.5 ML MISC INJECT INTO SKIN 5 TIMES DAILY E10.65   lisinopril -hydrochlorothiazide  (ZESTORETIC ) 20-25 MG tablet Take 1 tablet by mouth daily.   metFORMIN  (GLUCOPHAGE ) 1000 MG tablet Take 1 tablet (1,000 mg total) by mouth 2 (two) times daily with a meal.   nystatin  cream (MYCOSTATIN ) Apply 1 Application topically 2 (two) times daily.   ONETOUCH ULTRA test strip TEST TWICE DAILY E11.9   simvastatin  (ZOCOR ) 40 MG tablet Take 1 tablet (40 mg total) by mouth daily.   No facility-administered encounter medications on file as of 07/01/2024.    Past Surgical History:  Procedure Laterality Date   APPENDECTOMY     BREAST CYST EXCISION     benign   CARPAL TUNNEL RELEASE     CHOLECYSTECTOMY     COLONOSCOPY  02/13/2012   EXAMINATION UNDER ANESTHESIA     and sphincterotomy   HAMMER TOE SURGERY     bilateral; 5th toes   LEFT HEART CATH  02/28/2012   Procedure: LEFT HEART CATH;  Surgeon: Debby JONETTA Como, MD;  Location: [facility] CATH LAB;  Service: Cardiovascular;;   LEFT HEART CATHETERIZATION WITH CORONARY ANGIOGRAM N/A 02/28/2012   Procedure: LEFT HEART CATHETERIZATION WITH CORONARY ANGIOGRAM;  Surgeon: Debby JONETTA Como, MD;  Location: [facility] CATH LAB;  Service: Cardiovascular;  Laterality: N/A;   VAGINAL HYSTERECTOMY  1978    Family History  Problem Relation Age of Onset   Heart failure Mother        CHF    COPD Father    Cancer Brother        prostate   Kidney Stones Brother    Epilepsy Daughter    Kidney Stones Daughter    Colon cancer Neg Hx    Stomach cancer Neg Hx       Controlled substance contract: n/a     Review of Systems  Constitutional:  Negative for diaphoresis.  Eyes:   Negative for pain.  Respiratory:  Negative for shortness of breath.   Cardiovascular:  Negative for chest pain, palpitations and leg swelling.  Gastrointestinal:  Negative for abdominal pain.  Endocrine: Negative for polydipsia.  Musculoskeletal:  Positive for back pain.  Skin:  Negative for rash.  Neurological:  Negative for dizziness, weakness and headaches.  Hematological:  Does not bruise/bleed easily.  All other systems reviewed  and are negative.      Objective:   Physical Exam Vitals and nursing note reviewed.  Constitutional:      General: She is not in acute distress.    Appearance: Normal appearance. She is well-developed.  HENT:     Head: Normocephalic.     Right Ear: Tympanic membrane normal.     Left Ear: Tympanic membrane normal.     Nose: Nose normal.     Mouth/Throat:     Mouth: Mucous membranes are moist.  Eyes:     Pupils: Pupils are equal, round, and reactive to light.  Neck:     Vascular: No carotid bruit or JVD.  Cardiovascular:     Rate and Rhythm: Normal rate and regular rhythm.     Heart sounds: Normal heart sounds.  Pulmonary:     Effort: Pulmonary effort is normal. No respiratory distress.     Breath sounds: Normal breath sounds. No wheezing or rales.  Chest:     Chest wall: No tenderness.  Abdominal:     General: Bowel sounds are normal. There is no distension or abdominal bruit.     Palpations: Abdomen is soft. There is no hepatomegaly, splenomegaly, mass or pulsatile mass.     Tenderness: There is no abdominal tenderness.  Musculoskeletal:        General: Normal range of motion.     Cervical back: Normal range of motion and neck supple.     Comments: Rising slowly from sitting to standing FROM of lumbar spine with pain on extension (-) SLR bil Motor strength and sensation distally intact  Lymphadenopathy:     Cervical: No cervical adenopathy.  Skin:    General: Skin is warm and dry.  Neurological:     Mental Status: She is alert and  oriented to person, place, and time.     Deep Tendon Reflexes: Reflexes are normal and symmetric.  Psychiatric:        Behavior: Behavior normal.        Thought Content: Thought content normal.        Judgment: Judgment normal.     BP (!) 160/70   Pulse 77   Temp (!) 97.1 F (36.2 C) (Temporal)   Ht 5' 7 (1.702 m)   Wt 218 lb (98.9 kg)   SpO2 97%   BMI 34.14 kg/m     Hgba1c 7.2%     Assessment & Plan:  Adrienne Cook comes in today with chief complaint of Medication Management   Diagnosis and orders addressed:  1. Primary hypertension (Primary) Low sodium diet - CBC with Differential/Platelet - CMP14+EGFR  2. 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 INJECT 0.4 MLS (40 UNITS TOTAL) INTO THE SKIN 2 (TWO) TIMES DAILY.  Dispense: 100 mL; Refill: 5  4. Hyperlipidemia associated with type 2 diabetes mellitus (HCC) Low fat diet - Lipid panel  5. Obstructive sleep apnea syndrome Refuses to wear CPAP  6. OAB (overactive bladder) Added detrol LA to meds- will take a few weeks to work  7. CKD stage 3b, GFR 30-44 ml/min (HCC) Labs pending  8. Major depressive disorder, recurrent episode, mild Stress management  9. Mixed stress and urge urinary incontinence - tolterodine (DETROL LA) 4 MG 24 hr capsule; Take 1 capsule (4 mg total) by mouth daily.  Dispense: 90 capsule; Refill: 1  10. Functional diarrhea Stop metformin   11. Chronic midline low back pain with left-sided sciatica Moist heat Back stretches - meloxicam  (MOBIC ) 15 MG tablet; Take 1 tablet (15 mg total) by mouth daily.  Dispense: 30 tablet; Refill: 0  12. Duodenal ulcer Prevacid OTC  daily Report if not improving  Labs pending Health Maintenance reviewed Diet and exercise encouraged  Follow up plan: 3 months   Mary-Margaret Gladis, FNP

## 2024-07-02 ENCOUNTER — Ambulatory Visit: Payer: Self-pay | Admitting: Nurse Practitioner

## 2024-07-02 LAB — CMP14+EGFR
ALT: 11 IU/L (ref 0–32)
AST: 16 IU/L (ref 0–40)
Albumin: 4.2 g/dL (ref 3.7–4.7)
Alkaline Phosphatase: 67 IU/L (ref 48–129)
BUN/Creatinine Ratio: 17 (ref 12–28)
BUN: 20 mg/dL (ref 8–27)
Bilirubin Total: 0.3 mg/dL (ref 0.0–1.2)
CO2: 22 mmol/L (ref 20–29)
Calcium: 9.9 mg/dL (ref 8.7–10.3)
Chloride: 103 mmol/L (ref 96–106)
Creatinine, Ser: 1.17 mg/dL — ABNORMAL HIGH (ref 0.57–1.00)
Globulin, Total: 2.2 g/dL (ref 1.5–4.5)
Glucose: 174 mg/dL — ABNORMAL HIGH (ref 70–99)
Potassium: 5.1 mmol/L (ref 3.5–5.2)
Sodium: 141 mmol/L (ref 134–144)
Total Protein: 6.4 g/dL (ref 6.0–8.5)
eGFR: 47 mL/min/1.73 — ABNORMAL LOW (ref >59)

## 2024-07-02 LAB — LIPID PANEL
Chol/HDL Ratio: 2.8 ratio (ref 0.0–4.4)
Cholesterol, Total: 137 mg/dL (ref 100–199)
HDL: 49 mg/dL (ref >39)
LDL Chol Calc (NIH): 66 mg/dL (ref 0–99)
Triglycerides: 125 mg/dL (ref 0–149)
VLDL Cholesterol Cal: 22 mg/dL (ref 5–40)

## 2024-07-02 LAB — CBC WITH DIFFERENTIAL/PLATELET
Basophils Absolute: 0.1 x10E3/uL (ref 0.0–0.2)
Basos: 1 %
EOS (ABSOLUTE): 0.2 x10E3/uL (ref 0.0–0.4)
Eos: 2 %
Hematocrit: 38.6 % (ref 34.0–46.6)
Hemoglobin: 12.2 g/dL (ref 11.1–15.9)
Immature Grans (Abs): 0 x10E3/uL (ref 0.0–0.1)
Immature Granulocytes: 0 %
Lymphocytes Absolute: 2.2 x10E3/uL (ref 0.7–3.1)
Lymphs: 25 %
MCH: 30.1 pg (ref 26.6–33.0)
MCHC: 31.6 g/dL (ref 31.5–35.7)
MCV: 95 fL (ref 79–97)
Monocytes Absolute: 0.7 x10E3/uL (ref 0.1–0.9)
Monocytes: 8 %
Neutrophils Absolute: 5.6 x10E3/uL (ref 1.4–7.0)
Neutrophils: 64 %
Platelets: 323 x10E3/uL (ref 150–450)
RBC: 4.05 x10E6/uL (ref 3.77–5.28)
RDW: 13.2 % (ref 11.7–15.4)
WBC: 8.7 x10E3/uL (ref 3.4–10.8)

## 2024-07-02 LAB — VITAMIN B12: Vitamin B-12: 921 pg/mL (ref 232–1245)

## 2024-07-07 DIAGNOSIS — B078 Other viral warts: Secondary | ICD-10-CM | POA: Diagnosis not present

## 2024-07-07 DIAGNOSIS — L820 Inflamed seborrheic keratosis: Secondary | ICD-10-CM | POA: Diagnosis not present

## 2024-07-09 ENCOUNTER — Ambulatory Visit: Admitting: Nurse Practitioner

## 2024-07-13 ENCOUNTER — Other Ambulatory Visit: Payer: Self-pay

## 2024-07-13 MED ORDER — AMOXICILLIN-POT CLAVULANATE 875-125 MG PO TABS: 1.0000 | ORAL_TABLET | Freq: Two times a day (BID) | ORAL | 0 refills | Status: DC

## 2024-07-13 MED ORDER — BENZONATATE 100 MG PO CAPS: 100.0000 mg | ORAL_CAPSULE | Freq: Two times a day (BID) | ORAL | 0 refills | Status: DC | PRN

## 2024-09-23 ENCOUNTER — Other Ambulatory Visit: Payer: Self-pay | Admitting: Nurse Practitioner

## 2024-09-23 DIAGNOSIS — E1142 Type 2 diabetes mellitus with diabetic polyneuropathy: Secondary | ICD-10-CM

## 2024-09-27 ENCOUNTER — Encounter: Payer: Self-pay | Admitting: Nurse Practitioner

## 2024-09-27 ENCOUNTER — Ambulatory Visit: Payer: Self-pay | Admitting: Nurse Practitioner

## 2024-09-27 VITALS — BP 131/64 | HR 75 | Temp 97.3°F | Ht 67.0 in | Wt 218.0 lb

## 2024-09-27 DIAGNOSIS — I10 Essential (primary) hypertension: Secondary | ICD-10-CM | POA: Diagnosis not present

## 2024-09-27 DIAGNOSIS — F330 Major depressive disorder, recurrent, mild: Secondary | ICD-10-CM | POA: Diagnosis not present

## 2024-09-27 DIAGNOSIS — E66813 Obesity, class 3: Secondary | ICD-10-CM

## 2024-09-27 DIAGNOSIS — K5790 Diverticulosis of intestine, part unspecified, without perforation or abscess without bleeding: Secondary | ICD-10-CM | POA: Diagnosis not present

## 2024-09-27 DIAGNOSIS — G4733 Obstructive sleep apnea (adult) (pediatric): Secondary | ICD-10-CM | POA: Diagnosis not present

## 2024-09-27 DIAGNOSIS — E1169 Type 2 diabetes mellitus with other specified complication: Secondary | ICD-10-CM | POA: Diagnosis not present

## 2024-09-27 DIAGNOSIS — Z6841 Body Mass Index (BMI) 40.0 and over, adult: Secondary | ICD-10-CM

## 2024-09-27 DIAGNOSIS — Z794 Long term (current) use of insulin: Secondary | ICD-10-CM

## 2024-09-27 DIAGNOSIS — E785 Hyperlipidemia, unspecified: Secondary | ICD-10-CM

## 2024-09-27 DIAGNOSIS — N3281 Overactive bladder: Secondary | ICD-10-CM | POA: Diagnosis not present

## 2024-09-27 DIAGNOSIS — N1832 Chronic kidney disease, stage 3b: Secondary | ICD-10-CM

## 2024-09-27 DIAGNOSIS — N3946 Mixed incontinence: Secondary | ICD-10-CM

## 2024-09-27 DIAGNOSIS — F320 Major depressive disorder, single episode, mild: Secondary | ICD-10-CM

## 2024-09-27 DIAGNOSIS — E119 Type 2 diabetes mellitus without complications: Secondary | ICD-10-CM

## 2024-09-27 LAB — CBC WITH DIFFERENTIAL/PLATELET
Basophils Absolute: 0.1 x10E3/uL (ref 0.0–0.2)
Basos: 1 %
EOS (ABSOLUTE): 0.1 x10E3/uL (ref 0.0–0.4)
Eos: 1 %
Hematocrit: 39.1 % (ref 34.0–46.6)
Hemoglobin: 12.6 g/dL (ref 11.1–15.9)
Immature Grans (Abs): 0 x10E3/uL (ref 0.0–0.1)
Immature Granulocytes: 0 %
Lymphocytes Absolute: 2.5 x10E3/uL (ref 0.7–3.1)
Lymphs: 22 %
MCH: 29.8 pg (ref 26.6–33.0)
MCHC: 32.2 g/dL (ref 31.5–35.7)
MCV: 92 fL (ref 79–97)
Monocytes Absolute: 0.9 x10E3/uL (ref 0.1–0.9)
Monocytes: 8 %
Neutrophils Absolute: 7.8 x10E3/uL — ABNORMAL HIGH (ref 1.4–7.0)
Neutrophils: 68 %
Platelets: 334 x10E3/uL (ref 150–450)
RBC: 4.23 x10E6/uL (ref 3.77–5.28)
RDW: 13.3 % (ref 11.7–15.4)
WBC: 11.5 x10E3/uL — ABNORMAL HIGH (ref 3.4–10.8)

## 2024-09-27 LAB — LIPID PANEL
Chol/HDL Ratio: 3.1 ratio (ref 0.0–4.4)
Cholesterol, Total: 140 mg/dL (ref 100–199)
HDL: 45 mg/dL
LDL Chol Calc (NIH): 74 mg/dL (ref 0–99)
Triglycerides: 118 mg/dL (ref 0–149)
VLDL Cholesterol Cal: 21 mg/dL (ref 5–40)

## 2024-09-27 LAB — CMP14+EGFR
ALT: 13 IU/L (ref 0–32)
AST: 14 IU/L (ref 0–40)
Albumin: 4.1 g/dL (ref 3.7–4.7)
Alkaline Phosphatase: 93 IU/L (ref 48–129)
BUN/Creatinine Ratio: 17 (ref 12–28)
BUN: 25 mg/dL (ref 8–27)
Bilirubin Total: 0.4 mg/dL (ref 0.0–1.2)
CO2: 23 mmol/L (ref 20–29)
Calcium: 9.6 mg/dL (ref 8.7–10.3)
Chloride: 103 mmol/L (ref 96–106)
Creatinine, Ser: 1.49 mg/dL — ABNORMAL HIGH (ref 0.57–1.00)
Globulin, Total: 2.5 g/dL (ref 1.5–4.5)
Glucose: 160 mg/dL — ABNORMAL HIGH (ref 70–99)
Potassium: 4.9 mmol/L (ref 3.5–5.2)
Sodium: 140 mmol/L (ref 134–144)
Total Protein: 6.6 g/dL (ref 6.0–8.5)
eGFR: 35 mL/min/1.73 — ABNORMAL LOW

## 2024-09-27 LAB — BAYER DCA HB A1C WAIVED: HB A1C (BAYER DCA - WAIVED): 9.2 % — ABNORMAL HIGH (ref 4.8–5.6)

## 2024-09-27 MED ORDER — GABAPENTIN 400 MG PO CAPS: ORAL_CAPSULE | ORAL | 1 refills | Status: AC

## 2024-09-27 MED ORDER — LISINOPRIL-HYDROCHLOROTHIAZIDE 20-25 MG PO TABS: 1.0000 | ORAL_TABLET | Freq: Every day | ORAL | 1 refills | Status: AC

## 2024-09-27 MED ORDER — TOLTERODINE TARTRATE ER 4 MG PO CP24: 4.0000 mg | ORAL_CAPSULE | Freq: Every day | ORAL | 1 refills | Status: AC

## 2024-09-27 MED ORDER — DILTIAZEM HCL ER COATED BEADS 240 MG PO CP24: 240.0000 mg | ORAL_CAPSULE | Freq: Every day | ORAL | 1 refills | Status: AC

## 2024-09-27 MED ORDER — CITALOPRAM HYDROBROMIDE 40 MG PO TABS: 40.0000 mg | ORAL_TABLET | Freq: Every day | ORAL | 1 refills | Status: AC

## 2024-09-27 MED ORDER — INSULIN NPH (HUMAN) (ISOPHANE) 100 UNIT/ML ~~LOC~~ SUSP: 55.0000 [IU] | Freq: Two times a day (BID) | SUBCUTANEOUS | 5 refills | Status: AC

## 2024-09-27 MED ORDER — INSULIN REGULAR HUMAN 100 UNIT/ML IJ SOLN: INTRAMUSCULAR | 5 refills | Status: AC

## 2024-09-27 MED ORDER — SIMVASTATIN 40 MG PO TABS: 40.0000 mg | ORAL_TABLET | Freq: Every day | ORAL | 1 refills | Status: AC

## 2024-09-27 NOTE — Patient Instructions (Signed)

## 2024-09-27 NOTE — Progress Notes (Signed)
 "  Subjective:    Patient ID: Adrienne Cook, female    DOB: 22-Oct-1940, 84 y.o.   MRN: [id number]   Chief Complaint: medical management of chronic issues     HPI:  Adrienne Cook is a 84 y.o. who identifies as a female who was assigned female at birth.   Social history: Lives with: daughter and grand son Work history: retired   Water Engineer in today for follow up of the following chronic medical issues:  1. Primary hypertension No c/o chest pain, sob or headache. Does not check blood pressure at home. BP Readings from Last 3 Encounters:  07/01/24 (!) 160/70  04/01/24 136/74  03/29/24 136/74     2. Diverticulosis Denies any recent flare ups. Denies abdominal pain  3. Diabetes mellitus treated with neuropathy (HCC) Fasting blood sugars are running around- 140-170. She has not been watching her diet Lab Results  Component Value Date   HGBA1C 7.2 (H) 07/01/2024     4. Hyperlipidemia associated with type 2 diabetes mellitus (HCC) Does not watch diet and does no dedicated exercise. Lab Results  Component Value Date   CHOL 137 07/01/2024   HDL 49 07/01/2024   LDLCALC 66 07/01/2024   TRIG 125 07/01/2024   CHOLHDL 2.8 07/01/2024     5. Obstructive sleep apnea syndrome She does not wear cpap at night. Says she sleeps fine and feels rested in mornings.  6. CKD 3B Last egfr 41 Lab Results  Component Value Date   CREATININE 1.17 (H) 07/01/2024     7. OAB (overactive bladder) On no meds forthis right now. Says she feels like she has  to void often. Has to get up at night to void.  8. Depression, major, single episode, mild (HCC) Has been on celexa  for several years. Says she is doing okay.    09/27/2024    8:45 AM 07/01/2024    8:36 AM 04/01/2024    1:49 PM  Depression screen PHQ 2/9  Decreased Interest 0 0 0  Down, Depressed, Hopeless 0 0 0  PHQ - 2 Score 0 0 0  Altered sleeping 0  0  Tired, decreased energy 1  0  Change in appetite 0  0  Feeling bad or  failure about yourself  0  0  Trouble concentrating 0  0  Moving slowly or fidgety/restless 0  0  Suicidal thoughts 0  0  PHQ-9 Score 1  0   Difficult doing work/chores Not difficult at all  Not difficult at all     Data saved with a previous flowsheet row definition        9. Class 3 severe obesity due to excess calories with serious comorbidity and body mass index (BMI) greater than or equal to 70 in adult [facility]) No recent weight changes  Wt Readings from Last 3 Encounters:  09/27/24 218 lb (98.9 kg)  07/01/24 218 lb (98.9 kg)  04/01/24 216 lb (98 kg)   BMI Readings from Last 3 Encounters:  09/27/24 34.14 kg/m  07/01/24 34.14 kg/m  04/01/24 33.83 kg/m     New complaints: None today   No Known Allergies Outpatient Encounter Medications as of 09/27/2024  Medication Sig   amoxicillin -clavulanate (AUGMENTIN ) 875-125 MG tablet Take 1 tablet by mouth 2 (two) times daily.   aspirin  81 MG EC tablet Take 1 tablet (81 mg total) by mouth daily.   benzonatate  (TESSALON ) 100 MG capsule Take 1 capsule (100 mg total) by mouth 2 (two) times daily  as needed for cough.   calcium  elemental as carbonate (BARIATRIC TUMS ULTRA) 400 MG chewable tablet Chew 1,000 mg by mouth 3 (three) times daily.   Cholecalciferol (VITAMIN D3) 5000 UNITS TABS Take 5,000 Units by mouth daily.   citalopram  (CELEXA ) 40 MG tablet Take 1 tablet (40 mg total) by mouth daily.   Continuous Glucose Sensor (FREESTYLE LIBRE 3 PLUS SENSOR) MISC Apply every 14 days   cyanocobalamin  (VITAMIN B12) 1000 MCG tablet Take 1,000 mcg by mouth daily.   diltiazem  (CARDIZEM  CD) 240 MG 24 hr capsule Take 1 capsule (240 mg total) by mouth daily.   fluticasone  (FLONASE ) 50 MCG/ACT nasal spray SPRAY 2 SPRAYS INTO EACH NOSTRIL EVERY DAY (Patient not taking: Reported on 07/01/2024)   gabapentin  (NEURONTIN ) 400 MG capsule TAKE 1 TO 3 CAPSULES BY MOUTH AT BEDTIME   insulin  NPH Human (HUMULIN N) 100 UNIT/ML injection Inject 0.5 mLs (50  Units total) into the skin 2 (two) times daily before a meal.   insulin  regular (HUMULIN R ) 100 units/mL injection INJECT 10 TO 20 UNITS SUBCUTANEOUSLY THREE TIMES DAILY BEFORE MEAL(S)   Insulin  Syringe-Needle U-100 (B-D INS SYR ULTRAFINE .5CC/30G) 30G X 1/2 0.5 ML MISC INJECT INTO SKIN 5 TIMES DAILY E10.65   lisinopril -hydrochlorothiazide  (ZESTORETIC ) 20-25 MG tablet Take 1 tablet by mouth daily.   meloxicam  (MOBIC ) 15 MG tablet Take 1 tablet (15 mg total) by mouth daily.   nystatin  cream (MYCOSTATIN ) Apply 1 Application topically 2 (two) times daily.   ONETOUCH ULTRA test strip TEST TWICE DAILY E11.9   simvastatin  (ZOCOR ) 40 MG tablet Take 1 tablet (40 mg total) by mouth daily.   tolterodine  (DETROL  LA) 4 MG 24 hr capsule Take 1 capsule (4 mg total) by mouth daily.   No facility-administered encounter medications on file as of 09/27/2024.    Past Surgical History:  Procedure Laterality Date   APPENDECTOMY     BREAST CYST EXCISION     benign   CARPAL TUNNEL RELEASE     CHOLECYSTECTOMY     COLONOSCOPY  02/13/2012   EXAMINATION UNDER ANESTHESIA     and sphincterotomy   HAMMER TOE SURGERY     bilateral; 5th toes   LEFT HEART CATH  02/28/2012   Procedure: LEFT HEART CATH;  Surgeon: Debby JONETTA Como, MD;  Location: [facility] CATH LAB;  Service: Cardiovascular;;   LEFT HEART CATHETERIZATION WITH CORONARY ANGIOGRAM N/A 02/28/2012   Procedure: LEFT HEART CATHETERIZATION WITH CORONARY ANGIOGRAM;  Surgeon: Debby JONETTA Como, MD;  Location: [facility] CATH LAB;  Service: Cardiovascular;  Laterality: N/A;   VAGINAL HYSTERECTOMY  1978    Family History  Problem Relation Age of Onset   Heart failure Mother        CHF    COPD Father    Cancer Brother        prostate   Kidney Stones Brother    Epilepsy Daughter    Kidney Stones Daughter    Colon cancer Neg Hx    Stomach cancer Neg Hx       Controlled substance contract: n/a     Review of Systems  Constitutional:  Negative for diaphoresis.  Eyes:   Negative for pain.  Respiratory:  Negative for shortness of breath.   Cardiovascular:  Negative for chest pain, palpitations and leg swelling.  Gastrointestinal:  Negative for abdominal pain.  Endocrine: Negative for polydipsia.  Musculoskeletal:  Positive for back pain.  Skin:  Negative for rash.  Neurological:  Negative for dizziness, weakness and headaches.  Hematological:  Does not bruise/bleed easily.  All other systems reviewed and are negative.      Objective:   Physical Exam Vitals and nursing note reviewed.  Constitutional:      General: She is not in acute distress.    Appearance: Normal appearance. She is well-developed.  HENT:     Head: Normocephalic.     Right Ear: Tympanic membrane normal.     Left Ear: Tympanic membrane normal.     Nose: Nose normal.     Mouth/Throat:     Mouth: Mucous membranes are moist.  Eyes:     Pupils: Pupils are equal, round, and reactive to light.  Neck:     Vascular: No carotid bruit or JVD.  Cardiovascular:     Rate and Rhythm: Normal rate and regular rhythm.     Heart sounds: Normal heart sounds.  Pulmonary:     Effort: Pulmonary effort is normal. No respiratory distress.     Breath sounds: Normal breath sounds. No wheezing or rales.  Chest:     Chest wall: No tenderness.  Abdominal:     General: Bowel sounds are normal. There is no distension or abdominal bruit.     Palpations: Abdomen is soft. There is no hepatomegaly, splenomegaly, mass or pulsatile mass.     Tenderness: There is no abdominal tenderness.  Musculoskeletal:        General: Normal range of motion.     Cervical back: Normal range of motion and neck supple.     Comments: Rising slowly from sitting to standing FROM of lumbar spine with pain on extension (-) SLR bil Motor strength and sensation distally intact  Lymphadenopathy:     Cervical: No cervical adenopathy.  Skin:    General: Skin is warm and dry.  Neurological:     Mental Status: She is alert and  oriented to person, place, and time.     Deep Tendon Reflexes: Reflexes are normal and symmetric.  Psychiatric:        Behavior: Behavior normal.        Thought Content: Thought content normal.        Judgment: Judgment normal.     BP 131/64   Pulse 75   Temp (!) 97.3 F (36.3 C) (Temporal)   Ht 5' 7 (1.702 m)   Wt 218 lb (98.9 kg)   SpO2 94%   BMI 34.14 kg/m      Hgba1c 9.2%     Assessment & Plan:  JASIE MELESKI comes in today with chief complaint of No chief complaint on file.   Diagnosis and orders addressed:  1. Primary hypertension (Primary) Low sodium diet - CBC with Differential/Platelet - CMP14+EGFR  2. Diverticulosis Watch diet to prevent flare ups  3. Type II diabetes mellitus with nephropathy (HCC) Increase humulin N to 55u Bid Humuilin 20u with meals Continue to keep diary of blood sugars - Bayer DCA Hb A1c Waived - Vitamin B12 - insulin  NPH Human (HUMULIN N) 100 UNIT/ML injection; Inject 0.5 mLs (50 Units total) into the skin 2 (two) times daily before a meal. INJECT 0.4 MLS (40 UNITS TOTAL) INTO THE SKIN 2 (TWO) TIMES DAILY.  Dispense: 100 mL; Refill: 5  4. Hyperlipidemia associated with type 2 diabetes mellitus (HCC) Low fat diet - Lipid panel  5. Obstructive sleep apnea syndrome Refuses to wear CPAP  6. OAB (overactive bladder) Added detrol  LA to meds- will take a few weeks to work  7. CKD stage 3b,  GFR 30-44 ml/min (HCC) Labs pending  8. Major depressive disorder, recurrent episode, mild Stress management  9. Mixed stress and urge urinary incontinence - tolterodine  (DETROL  LA) 4 MG 24 hr capsule; Take 1 capsule (4 mg total) by mouth daily.  Dispense: 90 capsule; Refill: 1    Labs pending Health Maintenance reviewed Diet and exercise encouraged  Follow up plan: 3 months   Mary-Margaret Gladis, FNP  "

## 2024-09-28 ENCOUNTER — Ambulatory Visit: Payer: Self-pay | Admitting: Nurse Practitioner

## 2024-12-23 ENCOUNTER — Ambulatory Visit: Admitting: Nurse Practitioner

## 2025-04-04 ENCOUNTER — Ambulatory Visit: Payer: Self-pay
